# Patient Record
Sex: Female | Born: 1940 | Race: White | Hispanic: No | Marital: Married
Health system: Midwestern US, Community
[De-identification: ages and names within clinical notes are randomized; demographics above are authoritative.]

## PROBLEM LIST (undated history)

## (undated) DIAGNOSIS — I10 Essential (primary) hypertension: Secondary | ICD-10-CM

## (undated) DIAGNOSIS — E119 Type 2 diabetes mellitus without complications: Principal | ICD-10-CM

## (undated) DIAGNOSIS — C50919 Malignant neoplasm of unspecified site of unspecified female breast: Secondary | ICD-10-CM

## (undated) DIAGNOSIS — R5383 Other fatigue: Secondary | ICD-10-CM

## (undated) DIAGNOSIS — Z780 Asymptomatic menopausal state: Secondary | ICD-10-CM

## (undated) DIAGNOSIS — E1169 Type 2 diabetes mellitus with other specified complication: Principal | ICD-10-CM

## (undated) DIAGNOSIS — E785 Hyperlipidemia, unspecified: Secondary | ICD-10-CM

## (undated) DIAGNOSIS — R5381 Other malaise: Secondary | ICD-10-CM

## (undated) DIAGNOSIS — D509 Iron deficiency anemia, unspecified: Secondary | ICD-10-CM

## (undated) DIAGNOSIS — M810 Age-related osteoporosis without current pathological fracture: Secondary | ICD-10-CM

## (undated) DIAGNOSIS — D508 Other iron deficiency anemias: Secondary | ICD-10-CM

## (undated) DIAGNOSIS — G459 Transient cerebral ischemic attack, unspecified: Secondary | ICD-10-CM

## (undated) DIAGNOSIS — R920 Mammographic microcalcification found on diagnostic imaging of breast: Secondary | ICD-10-CM

---

## 2010-01-21 LAB — LIPID PANEL
Cholesterol, Total: 124
HDL: 39 mg/dl
LDL Calculated: 62 mg/dL (ref 0–160)
Triglycerides: 141 mg/dL

## 2010-01-21 LAB — HEMOGLOBIN A1C: Hemoglobin A1C: 11.2 %

## 2010-01-22 LAB — COMPREHENSIVE METABOLIC PANEL
ALT: 70 U/L — ABNORMAL HIGH (ref 0–63)
AST: 54 U/L — ABNORMAL HIGH (ref 0–35)
Albumin: 4.6 g/dL (ref 3.5–5.2)
Alk Phosphatase: 73 U/L (ref 40–135)
Anion Gap: 13 mEq/L (ref 7–13)
BUN: 14 mg/dL (ref 10–26)
CO2: 25 mEq/L (ref 22–32)
Calcium: 9.9 mg/dL (ref 8.5–10.2)
Chloride: 100 mEq/L (ref 99–111)
Creatinine: 0.74 mg/dL (ref 0.50–1.10)
GFR African American: 93.6
GFR Non-African American: 77.3
Globulin: 3 g/dL (ref 2.3–3.5)
Glucose: 159 mg/dL — ABNORMAL HIGH (ref 60–115)
Potassium: 5.2 mEq/L (ref 3.5–5.5)
Sodium: 138 mEq/L (ref 135–146)
Total Bilirubin: 0.3 mg/dL (ref 0.2–1.1)
Total Protein: 7.6 g/dL (ref 6.4–8.1)

## 2010-01-22 LAB — MICROALBUMIN / CREATININE URINE RATIO
Creatinine, Random Urine: 207.7 mg/dL
Microalbumin Creatinine Ratio: 11.1 mg/G (ref 0.0–30.0)
Microalbumin, Random Urine: 2.3 mg/dL

## 2010-01-22 LAB — CBC WITH DIFFERENTIAL
Basophils %: 0.3 % (ref 0.0–2.0)
Basophils Absolute: 0 10*3/uL (ref 0.0–0.2)
Eosinophils %: 1.5 % (ref 0.0–4.0)
Eosinophils Absolute: 0.2 10*3/uL (ref 0.0–0.7)
Granulocyte Absolute Count: 7.2 10*3/uL — ABNORMAL HIGH (ref 1.4–6.5)
Granulocytes, Relative Percent: 68.5 % (ref 42.2–75.2)
HCT: 38.4 % (ref 37.0–47.0)
Hemoglobin: 13 g/dL (ref 12.0–16.0)
Lymphocytes %: 23.4 % (ref 20.5–51.1)
Lymphocytes Absolute: 2.5 10*3/uL (ref 1.0–4.8)
MCH: 29.8 pg (ref 27.0–31.0)
MCHC: 33.9 % (ref 33.0–37.0)
MCV: 88 fL (ref 82.0–100.0)
MPV: 12.4 fL — ABNORMAL HIGH (ref 7.4–10.4)
Monocytes %: 6.3 % (ref 2.0–11.0)
Monocytes Absolute: 0.7 10*3/uL (ref 0.2–0.8)
Platelets: 194 10*3/uL (ref 130–400)
RBC: 4.36 10*6/uL (ref 4.20–5.40)
RDW: 13.8 % (ref 11.5–14.5)
WBC: 10.5 10*3/uL (ref 4.8–10.8)

## 2010-01-22 LAB — LIPID PANEL
Chol/HDL Ratio: 3.2
Cholesterol: 124 mg/dL (ref 0–199)
HDL: 39 mg/dL — ABNORMAL LOW (ref 40–59)
LDL Calculated: 62 mg/dL (ref 0–129)
LDL/HDL Ratio: 1.6 (ref 0.0–2.9)
Triglycerides: 141 mg/dL (ref 0–149)

## 2010-01-22 LAB — HEMOGLOBIN A1C: Hemoglobin A1C: 11.2 %HbA1c — ABNORMAL HIGH (ref 4.2–5.9)

## 2010-03-02 NOTE — Progress Notes (Signed)
Subjective:      Patient ID: Lori Chase is a 69 y.o. female.    Diabetes  She has type 2 diabetes mellitus. The initial diagnosis of diabetes was made 4 years ago. Pertinent negatives for hypoglycemia include no confusion, dizziness or headaches. Pertinent negatives for diabetes include no blurred vision, no chest pain, no fatigue, no visual change and no weakness. Pertinent negatives for hypoglycemia complications include no blackouts and no hospitalization. Current diabetic treatment includes oral agent (monotherapy). Her weight is decreasing steadily. She is following a low fat/cholesterol diet. Meal planning includes calorie counting. She has not had a previous visit with a dietician. She monitors blood glucose at home 1-2 x per day. Blood glucose monitoring compliance is good. Her breakfast blood glucose range is generally 110-130 mg/dl. She does not see a podiatrist.Eye exam is current.       Review of Systems   Constitutional: Negative for fatigue.   Eyes: Negative for blurred vision.   Cardiovascular: Negative for chest pain.   Neurological: Negative for dizziness, weakness and headaches.   Psychiatric/Behavioral: Negative for confusion.       FEMALE ROS:    REVIEW OF SYSTEMS:  The patient reports no problems with hearing or headaches.  She reports no problems with vision. She is advised to have a yearly eye examination. She has no chest pains or pressures, or shortness of breath.  She has no indigestion, heartburn, nausea, or vomiting.   She has no constipation, diarrhea, or  black, bloody, mucousy, or tarry stool.  She has no trouble passing urine or losing urine.  Libido seems to be normal.  She reports no musculoskeletal aches or pains.  No paresthesias or loss of strength.        Objective:   Physical Exam        PHYSICAL EXAMINATION:   VITAL SIGNS: are as recorded.  GENERAL:  The patient appears well nourished and well-developed, and with normal affect.  No acute respiratory distress. Alert and  oriented times 3. No skin rashes.     HEENT:  TMs normal bilaterally.  Canals and ears normal. Pharynx is clear. Extraocular eye motions intact and pain free.   Pupils reactive and equally round.  Sclerae and conjunctivae clear, normocephalic and atraumatic.      RESPIRATORY:  Clear and equal breath sounds with no acute respiratory distress.       HEART: Regular rhythm without murmur, rub or gallop.     ABDOMEN:  soft, nontender. No masses, guarding or rebound. Normoactive bowel sounds.     NECK: No masses or adenopathy palpable.  No bruits heard. No asymmetry visible.     COMPLETE EXTREMITIES: No edema, all four extremities with posterior tibial and radial pulses strong and palpable.     EXTREMITIES:  no edema in any extremity. No cyanosis or clubbing.    LOW BACK: No tenderness to palpation of inferior lumbar spine or either sacroiliac joint area.       Assessment:      No diagnosis found.  1. DM w/o complication type II    2. Hypertension            Plan:    No orders of the defined types were placed in this encounter.       No orders of the defined types were placed in this encounter.     Lose weight.        All bw next

## 2010-03-28 MED ORDER — BAYER BREEZE 2 SYSTEM W/DEVICE KIT
PACK | Status: DC
Start: 2010-03-28 — End: 2012-03-16

## 2010-04-10 MED ORDER — ACCU-CHEK MULTICLIX LANCETS MISC
Status: DC
Start: 2010-04-10 — End: 2010-05-11

## 2010-04-10 NOTE — Progress Notes (Signed)
Subjective:      Patient ID: Lori Chase is a 69 y.o. female.    Hypertension  This is a chronic problem. The current episode started more than 1 year ago. The problem has been waxing and waning since onset. The problem is controlled. Pertinent negatives include no anxiety, blurred vision, chest pain, headaches, neck pain, palpitations or shortness of breath. Risk factors for coronary artery disease include post-menopausal state.     Patient is here for regular check up on DM, and HTN. Patient need rxs for lancets on diabetic meter.    Review of Systems   HENT: Negative for neck pain.    Eyes: Negative for blurred vision.   Respiratory: Negative for shortness of breath.    Cardiovascular: Negative for chest pain and palpitations.   Neurological: Negative for headaches.       FEMALE ROS:    REVIEW OF SYSTEMS:  The patient reports no problems with hearing or headaches.  She reports no problems with vision. She is advised to have a yearly eye examination. She has no chest pains or pressures, or shortness of breath.  She has no indigestion, heartburn, nausea, or vomiting.   She has no constipation, diarrhea, or  black, bloody, mucousy, or tarry stool.  She has no trouble passing urine or losing urine.  Libido seems to be normal.  She reports no musculoskeletal aches or pains.  No paresthesias or loss of strength.        Objective:   Physical Exam      PHYSICAL EXAMINATION:   VITAL SIGNS: are as recorded.  GENERAL:  The patient appears well nourished and well-developed, and with normal affect.  No acute respiratory distress. Alert and oriented times 3. No skin rashes.     HEENT:  TMs normal bilaterally.  Canals and ears normal. Pharynx is clear. Extraocular eye motions intact and pain free.   Pupils reactive and equally round.  Sclerae and conjunctivae clear, normocephalic and atraumatic.      RESPIRATORY:  Clear and equal breath sounds with no acute respiratory distress.       HEART: Regular rhythm without murmur,  rub or gallop.     ABDOMEN:  soft, nontender. No masses, guarding or rebound. Normoactive bowel sounds.     NECK: No masses or adenopathy palpable.  No bruits heard. No asymmetry visible.     COMPLETE EXTREMITIES: No edema, all four extremities with posterior tibial and radial pulses strong and palpable.     EXTREMITIES:  no edema in any extremity. No cyanosis or clubbing.    LOW BACK: No tenderness to palpation of inferior lumbar spine or either sacroiliac joint area.       Assessment:      1. Type II or unspecified type diabetes mellitus without mention of complication, not stated as uncontrolled  Hemoglobin A1c, Accu-Chek Multiclix Lancets MISC   2. Hypertension  Comprehensive metabolic panel   3. HIGH CHOLESTEROL  Lipid panel, Comprehensive metabolic panel           Plan:      Orders Placed This Encounter   Medications   ??? Accu-Chek Multiclix Lancets MISC     Sig: by Does not apply route.     Dispense:  150 each     Refill:  0       Orders Placed This Encounter   Procedures   ??? Lipid panel     Order Specific Question:  Is Patient Fasting?/# of Hours  Answer:  yes   ??? Comprehensive metabolic panel   ??? Hemoglobin A1c     Symptoms and complaints should be improving over 48 hours and entirely resolved in two weeks, if not, patient should call the office for a follow-up appointment.    Patient refuses weight loss modification therapy. Life threatening complications and end-organ damage of not proceeding with recommendations was explained to patient.       Gluc 120-130.

## 2010-04-11 LAB — COMPREHENSIVE METABOLIC PANEL
ALT: 50 U/L (ref 0–63)
AST: 36 U/L — ABNORMAL HIGH (ref 0–35)
Albumin: 4.7 g/dL (ref 3.5–5.2)
Alk Phosphatase: 66 U/L (ref 40–135)
Anion Gap: 9 mEq/L (ref 7–13)
BUN: 15 mg/dL (ref 10–26)
CO2: 28 mEq/L (ref 22–32)
Calcium: 10.4 mg/dL — ABNORMAL HIGH (ref 8.5–10.2)
Chloride: 104 mEq/L (ref 99–111)
Creatinine: 0.76 mg/dL (ref 0.50–1.10)
GFR African American: 90.7
GFR Non-African American: 74.9
Globulin: 2.8 g/dL (ref 2.3–3.5)
Glucose: 122 mg/dL — ABNORMAL HIGH (ref 60–115)
Potassium: 6.3 mEq/L — CR (ref 3.5–5.5)
Sodium: 141 mEq/L (ref 135–146)
Total Bilirubin: 0.3 mg/dL (ref 0.2–1.1)
Total Protein: 7.5 g/dL (ref 6.4–8.1)

## 2010-04-11 LAB — LIPID PANEL
Chol/HDL Ratio: 2.8
Cholesterol: 140 mg/dL (ref 0–199)
HDL: 50 mg/dL (ref 40–59)
LDL Calculated: 70 mg/dL (ref 0–129)
LDL/HDL Ratio: 1.4 (ref 0.0–2.9)
Triglycerides: 122 mg/dL (ref 0–149)

## 2010-04-11 LAB — HEMOGLOBIN A1C: Hemoglobin A1C: 8.2 %HbA1c — ABNORMAL HIGH (ref 4.2–5.9)

## 2010-04-22 NOTE — Progress Notes (Signed)
Quick Note:    Results are abnormal but stable. Follow up with office visit as previously discussed at last visit.  ______

## 2010-05-11 MED ORDER — ESOMEPRAZOLE MAGNESIUM 40 MG PO CPDR
40 MG | ORAL_CAPSULE | Freq: Every day | ORAL | Status: DC
Start: 2010-05-11 — End: 2010-08-24

## 2010-05-11 MED ORDER — ATORVASTATIN CALCIUM 10 MG PO TABS
10 MG | ORAL_TABLET | Freq: Every day | ORAL | Status: DC
Start: 2010-05-11 — End: 2010-08-24

## 2010-05-11 MED ORDER — SITAGLIPTIN PHOSPHATE 100 MG PO TABS
100 MG | ORAL_TABLET | Freq: Every day | ORAL | Status: DC
Start: 2010-05-11 — End: 2010-11-29

## 2010-05-11 MED ORDER — ALENDRONATE SODIUM 70 MG PO TABS
70 MG | ORAL_TABLET | ORAL | Status: DC
Start: 2010-05-11 — End: 2011-01-22

## 2010-05-11 MED ORDER — VALSARTAN 160 MG PO TABS
160 MG | ORAL_TABLET | Freq: Every day | ORAL | Status: DC
Start: 2010-05-11 — End: 2011-06-11

## 2010-05-11 MED ORDER — BAYER MICROLET LANCETS MISC
Status: DC
Start: 2010-05-11 — End: 2012-03-16

## 2010-05-11 MED ORDER — GLIMEPIRIDE 4 MG PO TABS
4 MG | ORAL_TABLET | Freq: Every day | ORAL | Status: DC
Start: 2010-05-11 — End: 2010-05-28

## 2010-05-11 MED ORDER — AMLODIPINE BESYLATE 10 MG PO TABS
10 MG | ORAL_TABLET | Freq: Every day | ORAL | Status: DC
Start: 2010-05-11 — End: 2010-08-24

## 2010-05-11 MED ORDER — METFORMIN HCL ER 500 MG PO TB24
500 MG | ORAL_TABLET | Freq: Every day | ORAL | Status: DC
Start: 2010-05-11 — End: 2010-08-21

## 2010-05-11 NOTE — Progress Notes (Signed)
Subjective:      Patient ID: Lori Chase is a 69 y.o. female.    Diabetes  She presents for her follow-up diabetic visit. She has type 2 diabetes mellitus. Her disease course has been fluctuating. Pertinent negatives for hypoglycemia include no confusion, dizziness or headaches. Pertinent negatives for diabetes include no blurred vision, no chest pain, no fatigue, no foot ulcerations, no visual change and no weakness. Pertinent negatives for hypoglycemia complications include no blackouts. Risk factors for coronary artery disease include diabetes mellitus. Current diabetic treatment includes oral agent (monotherapy). She is compliant with treatment all of the time. Her weight is stable. She is following a diabetic diet. She monitors blood glucose at home 1-2 x per day. Her home blood glucose trend is fluctuating minimally.   Hypertension  This is a chronic problem. The current episode started more than 1 year ago. The problem has been waxing and waning since onset. Pertinent negatives include no anxiety, blurred vision, chest pain, headaches, neck pain or shortness of breath. Risk factors for coronary artery disease include dyslipidemia and diabetes mellitus. There are no compliance problems.      Patient is here for regular check up. Takes current medications and need refills on all current medication including bayer lansets.       Review of Systems   Constitutional: Negative for fatigue.   HENT: Negative for neck pain.    Eyes: Negative for blurred vision.   Respiratory: Negative for shortness of breath.    Cardiovascular: Negative for chest pain.   Neurological: Negative for dizziness, weakness and headaches.   Psychiatric/Behavioral: Negative for confusion.       FEMALE ROS:    REVIEW OF SYSTEMS:  The patient reports no problems with hearing or headaches.  She reports no problems with vision. She is advised to have a yearly eye examination. She has no chest pains or pressures, or shortness of breath.  She  has no indigestion, heartburn, nausea, or vomiting.   She has no constipation, diarrhea, or  black, bloody, mucousy, or tarry stool.  She has no trouble passing urine or losing urine.  Libido seems to be normal.  She reports no musculoskeletal aches or pains.  No paresthesias or loss of strength.        Objective:   Physical Exam      PHYSICAL EXAMINATION:   VITAL SIGNS: are as recorded.  GENERAL:  The patient appears well nourished and well-developed, and with normal affect.  No acute respiratory distress. Alert and oriented times 3. No skin rashes.     HEENT:  TMs normal bilaterally.  Canals and ears normal. Pharynx is clear. Extraocular eye motions intact and pain free.   Pupils reactive and equally round.  Sclerae and conjunctivae clear, normocephalic and atraumatic.      RESPIRATORY:  Clear and equal breath sounds with no acute respiratory distress.       HEART: Regular rhythm without murmur, rub or gallop.     ABDOMEN:  soft, nontender. No masses, guarding or rebound. Normoactive bowel sounds.     NECK: No masses or adenopathy palpable.  No bruits heard. No asymmetry visible.     COMPLETE EXTREMITIES: No edema, all four extremities with posterior tibial and radial pulses strong and palpable.     EXTREMITIES:  no edema in any extremity. No cyanosis or clubbing.    LOW BACK: No tenderness to palpation of inferior lumbar spine or either sacroiliac joint area.  Assessment:      1. Type II or unspecified type diabetes mellitus without mention of complication, not stated as uncontrolled     2. Hypertension  Basic metabolic panel   3. Anxiety             Plan:      Orders Placed This Encounter   Medications   ??? BAYER MICROLET LANCETS MISC     Sig: by Does not apply route.     Dispense:  100 each     Refill:  1   ??? metformin (GLUCOPHAGE XR) 500 MG XR tablet     Sig: Take 1 tablet by mouth daily (with breakfast). 4 tablets together once daily     Dispense:  360 tablet     Refill:  0   ??? alendronate (FOSAMAX) 70 MG  tablet     Sig: Take 1 tablet by mouth every 7 days.     Dispense:  12 tablet     Refill:  0   ??? atorvastatin (LIPITOR) 10 MG tablet     Sig: Take 1 tablet by mouth daily.     Dispense:  90 tablet     Refill:  0   ??? amlodipine (NORVASC) 10 MG tablet     Sig: Take 1 tablet by mouth daily.     Dispense:  90 tablet     Refill:  0   ??? valsartan (DIOVAN) 160 MG tablet     Sig: Take 1 tablet by mouth daily.     Dispense:  90 tablet     Refill:  0   ??? sitagliptan (JANUVIA) 100 MG tablet     Sig: Take 1 tablet by mouth daily.     Dispense:  90 tablet     Refill:  0   ??? esomeprazole (NEXIUM) 40 MG capsule     Sig: Take 1 capsule by mouth every morning (before breakfast).     Dispense:  90 capsule     Refill:  0   ??? glimepiride (AMARYL) 4 MG tablet     Sig: Take 1 tablet by mouth every morning (before breakfast). 2 tablet together once daily       Dispense:  180 tablet     Refill:  0       Orders Placed This Encounter   Procedures   ??? Basic metabolic panel     Sugars <140.    Inc mg oxide bid.    Patient refuses good diabetic care. Life threatening complications and end-organ damage of not proceeding with recommendations was explained to patient.     Symptoms and complaints should be improving over 48 hours and entirely resolved in two weeks, if not, patient should call the office for a follow-up appointment.

## 2010-05-12 LAB — BASIC METABOLIC PANEL
Anion Gap: 10 mEq/L (ref 7–13)
BUN: 17 mg/dL (ref 10–26)
CO2: 30 mEq/L (ref 22–32)
Calcium: 10 mg/dL (ref 8.5–10.2)
Chloride: 101 mEq/L (ref 99–111)
Creatinine: 0.71 mg/dL (ref 0.50–1.10)
GFR African American: 98.1
GFR Non-African American: 81
Glucose: 190 mg/dL — ABNORMAL HIGH (ref 60–115)
Potassium: 5.3 mEq/L (ref 3.5–5.5)
Sodium: 141 mEq/L (ref 135–146)

## 2010-05-28 MED ORDER — GLIMEPIRIDE 4 MG PO TABS
4 MG | ORAL_TABLET | Freq: Every day | ORAL | Status: DC
Start: 2010-05-28 — End: 2010-07-23

## 2010-06-08 NOTE — Progress Notes (Signed)
Subjective:      Patient ID: Lori Chase is a 70 y.o. female.    Diabetes  She presents for her follow-up diabetic visit. She has type 2 diabetes mellitus. Her disease course has been stable. Pertinent negatives for hypoglycemia include no confusion, dizziness, headaches, hunger, nervousness/anxiousness, sleepiness or speech difficulty. Pertinent negatives for diabetes include no blurred vision, no chest pain, no fatigue, no foot paresthesias, no foot ulcerations, no visual change, no weakness and no weight loss. Pertinent negatives for hypoglycemia complications include no blackouts and no hospitalization. Symptoms are stable. Current diabetic treatment includes oral agent (monotherapy). Her weight is stable. She monitors blood glucose at home 1-2 x per day. Blood glucose monitoring compliance is good.     Patient is present today for a regular check up. She takes current medications and does not need refills at this time    Review of Systems   Constitutional: Negative for weight loss and fatigue.   Eyes: Negative for blurred vision.   Cardiovascular: Negative for chest pain.   Neurological: Negative for dizziness, speech difficulty, weakness and headaches.   Psychiatric/Behavioral: Negative for confusion. The patient is not nervous/anxious.      FEMALE ROS:    REVIEW OF SYSTEMS:  The patient reports no problems with hearing or headaches.  She reports no problems with vision. She is advised to have a yearly eye examination. She has no chest pains or pressures, or shortness of breath.  She has no indigestion, heartburn, nausea, or vomiting.   She has no constipation, diarrhea, or  black, bloody, mucousy, or tarry stool.  She has no trouble passing urine or losing urine.  Libido seems to be normal.  She reports no musculoskeletal aches or pains.  No paresthesias or loss of strength.          Objective:   Physical Exam      PHYSICAL EXAMINATION:   VITAL SIGNS: are as recorded.  GENERAL:  The patient appears well  nourished and well-developed, and with normal affect.  No acute respiratory distress. Alert and oriented times 3. No skin rashes.     HEENT:  TMs normal bilaterally.  Canals and ears normal. Pharynx is clear. Extraocular eye motions intact and pain free.   Pupils reactive and equally round.  Sclerae and conjunctivae clear, normocephalic and atraumatic.      RESPIRATORY:  Clear and equal breath sounds with no acute respiratory distress.       HEART: Regular rhythm without murmur, rub or gallop.     ABDOMEN:  soft, nontender. No masses, guarding or rebound. Normoactive bowel sounds.     NECK: No masses or adenopathy palpable.  No bruits heard. No asymmetry visible.     COMPLETE EXTREMITIES: No edema, all four extremities with posterior tibial and radial pulses strong and palpable.     EXTREMITIES:  no edema in any extremity. No cyanosis or clubbing.    LOW BACK: No tenderness to palpation of inferior lumbar spine or either sacroiliac joint area.         Assessment:      Encounter Diagnoses   Name Primary?   ??? Type II or unspecified type diabetes mellitus without mention of complication, not stated as uncontrolled    ??? Hypertension    ??? Anxiety    ??? Obesity            Plan:      No orders of the defined types were placed in this encounter.  No orders of the defined types were placed in this encounter.     Symptoms and complaints should be improving over 48 hours and entirely resolved in two weeks, if not, patient should call the office for a follow-up appointment.    BW next.    Patient refuses weight loss modification therapy. Life threatening complications and end-organ damage of not proceeding with recommendations was explained to patient.

## 2010-07-16 NOTE — Progress Notes (Addendum)
Subjective:      Patient ID: Lori Chase is a 70 y.o. female.    Diabetes  She presents for her follow-up diabetic visit. She has type 2 diabetes mellitus. Her disease course has been stable. Pertinent negatives for hypoglycemia include no confusion, dizziness, headaches, mood changes, nervousness/anxiousness, sleepiness or speech difficulty. Pertinent negatives for diabetes include no blurred vision, no chest pain, no foot ulcerations, no polydipsia, no visual change and no weakness. Her weight is stable. She is following a generally healthy diet. She has not had a previous visit with a dietician. She monitors blood glucose at home 1-2 x per day. She does not see a podiatrist.Eye exam is current.     Patient is here for a regular check up. She states she is due for some blood work. She takes current medications she does not need refills at this time.      Review of Systems   Eyes: Negative for blurred vision.   Cardiovascular: Negative for chest pain.   Neurological: Negative for dizziness, speech difficulty, weakness and headaches.   Hematological: Negative for polydipsia.   Psychiatric/Behavioral: Negative for confusion. The patient is not nervous/anxious.      FEMALE ROS:    REVIEW OF SYSTEMS:  The patient reports no problems with hearing or headaches.  She reports no problems with vision. She is advised to have a yearly eye examination. She has no chest pains or pressures, or shortness of breath.  She has no indigestion, heartburn, nausea, or vomiting.   She has no constipation, diarrhea, or  black, bloody, mucousy, or tarry stool.  She has no trouble passing urine or losing urine.  Libido seems to be normal.  She reports no musculoskeletal aches or pains.  No paresthesias or loss of strength.          Objective:   Physical Exam      PHYSICAL EXAMINATION:   VITAL SIGNS: are as recorded.  GENERAL:  The patient appears well nourished and well-developed, and with normal affect.  No acute respiratory distress.  Alert and oriented times 3. No skin rashes.     HEENT:  TMs normal bilaterally.  Canals and ears normal. Pharynx is clear. Extraocular eye motions intact and pain free.   Pupils reactive and equally round.  Sclerae and conjunctivae clear, normocephalic and atraumatic.      RESPIRATORY:  Clear and equal breath sounds with no acute respiratory distress.       HEART: Regular rhythm without murmur, rub or gallop.     ABDOMEN:  soft, nontender. No masses, guarding or rebound. Normoactive bowel sounds.     NECK: No masses or adenopathy palpable.  No bruits heard. No asymmetry visible.     COMPLETE EXTREMITIES: No edema, all four extremities with posterior tibial and radial pulses strong and palpable.     EXTREMITIES:  no edema in any extremity. No cyanosis or clubbing.    LOW BACK: No tenderness to palpation of inferior lumbar spine or either sacroiliac joint area.       Assessment:      Encounter Diagnoses   Name Primary?   ??? Type II or unspecified type diabetes mellitus without mention of complication, not stated as uncontrolled    ??? Hypertension    ??? Other malaise and fatigue    ??? HIGH CHOLESTEROL            Plan:      No orders of the defined types were placed in this  encounter.     Orders Placed This Encounter   Procedures   ??? CBC auto differential   ??? Comprehensive metabolic panel   ??? Lipid panel     Order Specific Question:  Is Patient Fasting?/# of Hours     Answer:  yes   ??? Hemoglobin A1c     Symptoms and complaints should be improving over 48 hours and entirely resolved in two weeks, if not, patient should call the office for a follow-up appointment.    Patient refuses weight loss modification therapy. Life threatening complications and end-organ damage of not proceeding with recommendations was explained to patient.     Microalb next.    Eye exam qyr.      DIABETIC FOOT EXAM:    Dorsalis pedis and posterior tibial pulses are symmetric.  No fissures between the toes.  No open sores on the feet.  Sensation  intact.

## 2010-07-17 LAB — CBC WITH DIFFERENTIAL
Basophils %: 0.1 % (ref 0.0–2.0)
Basophils Absolute: 0 10*3/uL (ref 0.0–0.2)
Eosinophils %: 1.4 % (ref 0.0–4.0)
Eosinophils Absolute: 0.1 10*3/uL (ref 0.0–0.7)
Granulocyte Absolute Count: 7.1 10*3/uL — ABNORMAL HIGH (ref 1.4–6.5)
Granulocytes, Relative Percent: 72.2 % (ref 42.2–75.2)
HCT: 38.9 % (ref 37.0–47.0)
Hemoglobin: 12.7 g/dL (ref 12.0–16.0)
Lymphocytes %: 19.7 % — ABNORMAL LOW (ref 20.5–51.1)
Lymphocytes Absolute: 1.9 10*3/uL (ref 1.0–4.8)
MCH: 28.9 pg (ref 27.0–31.0)
MCHC: 32.5 % — ABNORMAL LOW (ref 33.0–37.0)
MCV: 88.8 fL (ref 82.0–100.0)
MPV: 11.4 fL — ABNORMAL HIGH (ref 7.4–10.4)
Monocytes %: 6.6 % (ref 2.0–11.0)
Monocytes Absolute: 0.6 10*3/uL (ref 0.2–0.8)
Platelets: 257 10*3/uL (ref 130–400)
RBC: 4.38 10*6/uL (ref 4.20–5.40)
RDW: 14.3 % (ref 11.5–14.5)
WBC: 9.8 10*3/uL (ref 4.8–10.8)

## 2010-07-17 LAB — COMPREHENSIVE METABOLIC PANEL
ALT: 58 U/L (ref 0–63)
AST: 35 U/L (ref 0–35)
Albumin: 4.5 g/dL (ref 3.5–5.2)
Alk Phosphatase: 65 U/L (ref 40–135)
Anion Gap: 8 mEq/L (ref 7–13)
BUN: 15 mg/dL (ref 10–26)
CO2: 26 mEq/L (ref 22–32)
Calcium: 10.1 mg/dL (ref 8.5–10.2)
Chloride: 105 mEq/L (ref 99–111)
Creatinine: 0.75 mg/dL (ref 0.50–1.10)
GFR African American: 92
GFR Non-African American: 76
Globulin: 3.1 g/dL (ref 2.3–3.5)
Glucose: 229 mg/dL — ABNORMAL HIGH (ref 60–115)
Potassium: 4.7 mEq/L (ref 3.5–5.5)
Sodium: 139 mEq/L (ref 135–146)
Total Bilirubin: 0.3 mg/dL (ref 0.2–1.1)
Total Protein: 7.6 g/dL (ref 6.4–8.1)

## 2010-07-17 LAB — LIPID PANEL
Chol/HDL Ratio: 3
Cholesterol: 142 mg/dL (ref 0–199)
HDL: 48 mg/dL (ref 40–59)
LDL Calculated: 67 mg/dL (ref 0–129)
LDL/HDL Ratio: 1.4 (ref 0.0–2.9)
Triglycerides: 167 mg/dL — ABNORMAL HIGH (ref 0–149)

## 2010-07-17 LAB — HEMOGLOBIN A1C: Hemoglobin A1C: 8.2 %HbA1c — ABNORMAL HIGH (ref 4.2–5.9)

## 2010-07-23 MED ORDER — GLIMEPIRIDE 4 MG PO TABS
4 MG | ORAL_TABLET | ORAL | Status: DC
Start: 2010-07-23 — End: 2011-01-28

## 2010-07-23 MED ORDER — VALSARTAN 80 MG PO TABS
80 MG | ORAL_TABLET | Freq: Every day | ORAL | Status: DC
Start: 2010-07-23 — End: 2011-01-28

## 2010-07-23 NOTE — Progress Notes (Signed)
Subjective:      Patient ID: Lori Chase is a 70 y.o. female.    Diabetes  She presents for her follow-up diabetic visit. She has type 2 diabetes mellitus. Pertinent negatives for hypoglycemia include no confusion, dizziness, headaches, mood changes, nervousness/anxiousness or speech difficulty. Pertinent negatives for diabetes include no blurred vision, no chest pain, no visual change and no weakness. Pertinent negatives for hypoglycemia complications include no blackouts and no hospitalization. Risk factors for coronary artery disease include diabetes mellitus, dyslipidemia and post-menopausal. Current diabetic treatment includes oral agent (monotherapy) and diet (metformin). She is compliant with treatment all of the time. Her weight is stable. She has not had a previous visit with a dietician. Her home blood glucose trend is fluctuating minimally. She does not see a podiatrist.Eye exam is current.     Patient is here for a follow up her medications. She would like to discuss if she needs to be taken off of them.    Review of Systems   Eyes: Negative for blurred vision.   Cardiovascular: Negative for chest pain.   Neurological: Negative for dizziness, speech difficulty, weakness and headaches.   Psychiatric/Behavioral: Negative for confusion. The patient is not nervous/anxious.      FEMALE ROS:    REVIEW OF SYSTEMS:  The patient reports no problems with hearing or headaches.  She reports no problems with vision. She is advised to have a yearly eye examination. She has no chest pains or pressures, or shortness of breath.  She has no indigestion, heartburn, nausea, or vomiting.   She has no constipation, diarrhea, or  black, bloody, mucousy, or tarry stool.  She has no trouble passing urine or losing urine.  Libido seems to be normal.  She reports no musculoskeletal aches or pains.  No paresthesias or loss of strength.          Objective:   Physical Exam      PHYSICAL EXAMINATION:   VITAL SIGNS: are as  recorded.  GENERAL:  The patient appears well nourished and well-developed, and with normal affect.  No acute respiratory distress. Alert and oriented times 3. No skin rashes.     HEENT:  TMs normal bilaterally.  Canals and ears normal. Pharynx is clear. Extraocular eye motions intact and pain free.   Pupils reactive and equally round.  Sclerae and conjunctivae clear, normocephalic and atraumatic.      RESPIRATORY:  Clear and equal breath sounds with no acute respiratory distress.       HEART: Regular rhythm without murmur, rub or gallop.     ABDOMEN:  soft, nontender. No masses, guarding or rebound. Normoactive bowel sounds.     NECK: No masses or adenopathy palpable.  No bruits heard. No asymmetry visible.     COMPLETE EXTREMITIES: No edema, all four extremities with posterior tibial and radial pulses strong and palpable.     EXTREMITIES:  no edema in any extremity. No cyanosis or clubbing.    LOW BACK: No tenderness to palpation of inferior lumbar spine or either sacroiliac joint area.       Assessment:      Encounter Diagnoses   Name Primary?   ??? Type II or unspecified type diabetes mellitus without mention of complication, not stated as uncontrolled    ??? HIGH CHOLESTEROL    ??? Anxiety    ??? Hypertension            Plan:      Orders Placed This Encounter  Medications   ??? glimepiride (AMARYL) 4 MG tablet     Sig: 2 tablet together once daily       Dispense:  180 tablet     Refill:  1   ??? valsartan (DIOVAN) 80 MG tablet     Sig: Take 1 tablet by mouth daily.     Dispense:  90 tablet     Refill:  1     No orders of the defined types were placed in this encounter.     microalbumen next.    Symptoms and complaints should be improving over 48 hours and entirely resolved in two weeks, if not, patient should call the office for a follow-up appointment.    Patient refuses insulin. Life threatening complications and end-organ damage of not proceeding with recommendations was explained to patient.     Patient refuses good  diabetic care. Life threatening complications and end-organ damage of not proceeding with recommendations was explained to patient.       Patient refuses weight loss modification therapy. Life threatening complications and end-organ damage of not proceeding with recommendations was explained to patient.

## 2010-08-22 MED ORDER — METFORMIN HCL ER 500 MG PO TB24
500 MG | ORAL_TABLET | ORAL | Status: DC
Start: 2010-08-22 — End: 2010-12-14

## 2010-08-24 MED ORDER — AMLODIPINE BESYLATE 10 MG PO TABS
10 MG | ORAL_TABLET | Freq: Every day | ORAL | Status: DC
Start: 2010-08-24 — End: 2010-12-14

## 2010-08-24 MED ORDER — ESOMEPRAZOLE MAGNESIUM 40 MG PO CPDR
40 MG | ORAL_CAPSULE | Freq: Every day | ORAL | Status: DC
Start: 2010-08-24 — End: 2010-12-14

## 2010-08-24 MED ORDER — ATORVASTATIN CALCIUM 10 MG PO TABS
10 MG | ORAL_TABLET | Freq: Every day | ORAL | Status: DC
Start: 2010-08-24 — End: 2010-12-14

## 2010-08-24 NOTE — Progress Notes (Signed)
Subjective:      Patient ID: Lori Chase is a 70 y.o. female.    Diabetes  She presents for her follow-up diabetic visit. She has type 2 diabetes mellitus. Pertinent negatives for hypoglycemia include no confusion, dizziness, headaches, mood changes, nervousness/anxiousness, pallor, sleepiness or speech difficulty. Pertinent negatives for diabetes include no blurred vision, no chest pain, no foot paresthesias, no foot ulcerations, no polydipsia, no polyphagia, no polyuria, no visual change and no weakness. Pertinent negatives for hypoglycemia complications include no blackouts and no hospitalization. Current diabetic treatment includes oral agent (monotherapy) (metformin, glimeperide). She is following a diabetic and generally healthy diet. Her home blood glucose trend is fluctuating minimally. Her breakfast blood glucose range is generally 110-130 mg/dl. Her overall blood glucose range is 110-130 mg/dl.     Patient is here for regular check up. She needs her lipitor norvasc and nexium refilled at this time    Review of Systems   Eyes: Negative for blurred vision.   Cardiovascular: Negative for chest pain.   Genitourinary: Negative for polyuria.   Skin: Negative for pallor.   Neurological: Negative for dizziness, speech difficulty, weakness and headaches.   Hematological: Negative for polydipsia and polyphagia.   Psychiatric/Behavioral: Negative for confusion. The patient is not nervous/anxious.        Objective:   Physical Exam      PHYSICAL EXAMINATION:   VITAL SIGNS: are as recorded.  GENERAL:  The patient appears well nourished and well-developed, and with normal affect.  No acute respiratory distress. Alert and oriented times 3. No skin rashes.     HEENT:  TMs normal bilaterally.  Canals and ears normal. Pharynx is clear. Extraocular eye motions intact and pain free.   Pupils reactive and equally round.  Sclerae and conjunctivae clear, normocephalic and atraumatic.      RESPIRATORY:  Clear and equal breath  sounds with no acute respiratory distress.       HEART: Regular rhythm without murmur, rub or gallop.     ABDOMEN:  soft, nontender. No masses, guarding or rebound. Normoactive bowel sounds.     NECK: No masses or adenopathy palpable.  No bruits heard. No asymmetry visible.     COMPLETE EXTREMITIES: No edema, all four extremities with posterior tibial and radial pulses strong and palpable.     EXTREMITIES:  no edema in any extremity. No cyanosis or clubbing.    LOW BACK: No tenderness to palpation of inferior lumbar spine or either sacroiliac joint area.       Assessment:      Encounter Diagnoses   Name Primary?   ??? Type II or unspecified type diabetes mellitus without mention of complication, not stated as uncontrolled    ??? Anxiety    ??? Hypertension    ??? Cancer    ??? HIGH CHOLESTEROL            Plan:      Orders Placed This Encounter   Medications   ??? atorvastatin (LIPITOR) 10 MG tablet     Sig: Take 1 tablet by mouth daily.     Dispense:  90 tablet     Refill:  0   ??? amlodipine (NORVASC) 10 MG tablet     Sig: Take 1 tablet by mouth daily.     Dispense:  90 tablet     Refill:  0   ??? esomeprazole (NEXIUM) 40 MG capsule     Sig: Take 1 capsule by mouth every morning (before breakfast).  Dispense:  90 capsule     Refill:  0     Orders Placed This Encounter   Procedures   ??? Microalbumin, Ur     Eye exam q yr.      DIABETIC FOOT EXAM:    Dorsalis pedis and posterior tibial pulses are symmetric.  No fissures between the toes.  No open sores on the feet.  Sensation intact .    Symptoms and complaints should be improving over 48 hours and entirely resolved in two weeks, if not, patient should call the office for a follow-up appointment.    See PV.

## 2010-08-25 LAB — MICROALBUMIN / CREATININE URINE RATIO
Creatinine, Random Urine: 192.3 mg/dL
Microalbumin Creatinine Ratio: 7.3 mg/G (ref 0.0–30.0)
Microalbumin, Random Urine: 1.4 mg/dL

## 2010-11-04 NOTE — Progress Notes (Signed)
Subjective:      Patient ID: Lori Chase is a 70 y.o. female.    Diabetes  She presents for her follow-up diabetic visit. She has type 2 diabetes mellitus. Pertinent negatives for hypoglycemia include no confusion, dizziness, headaches, hunger, mood changes, nervousness/anxiousness, pallor, seizures, sleepiness, speech difficulty, sweats or tremors. Pertinent negatives for diabetes include no blurred vision, no chest pain, no fatigue, no foot paresthesias, no foot ulcerations, no polydipsia, no polyphagia, no visual change and no weakness. Current diabetic treatment includes oral agent (monotherapy) Alma Friendly). She is following a diabetic and generally healthy diet. She has not had a previous visit with a dietician. She monitors blood glucose at home 1-2 x per day. Blood glucose monitoring compliance is excellent. Her home blood glucose trend is fluctuating minimally. She does not see a podiatrist.Eye exam is current.       Review of Systems   Constitutional: Negative for fatigue.   Eyes: Negative for blurred vision.   Cardiovascular: Negative for chest pain.   Skin: Negative for pallor.   Neurological: Negative for dizziness, tremors, seizures, speech difficulty, weakness and headaches.   Hematological: Negative for polydipsia and polyphagia.   Psychiatric/Behavioral: Negative for confusion. The patient is not nervous/anxious.        Objective:   Physical Exam      PHYSICAL EXAMINATION:   VITAL SIGNS: are as recorded.  GENERAL:  The patient appears well nourished and well-developed, and with normal affect.  No acute respiratory distress. Alert and oriented times 3. No skin rashes.     HEENT:  TMs normal bilaterally.  Canals and ears normal. Pharynx is clear. Extraocular eye motions intact and pain free.   Pupils reactive and equally round.  Sclerae and conjunctivae clear, normocephalic and atraumatic.      RESPIRATORY:  Clear and equal breath sounds with no acute respiratory distress.       HEART: Regular rhythm  without murmur, rub or gallop.     ABDOMEN:  soft, nontender. No masses, guarding or rebound. Normoactive bowel sounds.     NECK: No masses or adenopathy palpable.  No bruits heard. No asymmetry visible.     COMPLETE EXTREMITIES: No edema, all four extremities with posterior tibial and radial pulses strong and palpable.     EXTREMITIES:  no edema in any extremity. No cyanosis or clubbing.    LOW BACK: No tenderness to palpation of inferior lumbar spine or either sacroiliac joint area.       Assessment:      Encounter Diagnoses   Name Primary?   ??? Type II or unspecified type diabetes mellitus without mention of complication, not stated as uncontrolled    ??? Anxiety    ??? Hypertension    ??? HIGH CHOLESTEROL    ??? GERD (gastroesophageal reflux disease)    ??? Osteopenia    ??? Other malaise and fatigue            Plan:      No orders of the defined types were placed in this encounter.     Orders Placed This Encounter   Procedures   ??? CBC auto differential   ??? Comprehensive metabolic panel   ??? Lipid panel     Order Specific Question:  Is Patient Fasting?/# of Hours     Answer:  yes   ??? Hemoglobin A1c   ??? TSH     Health Maintenance Due   Topic Date Due   ??? Tetanus Vaccine Adult (11 Years And  Up)  11/09/1951   ??? Breast Cancer Screening  11/10/1978   ??? Colon Cancer Screening Colonoscopy  11/09/1988   ??? Pneumovax Vaccine Adult (#1) 11/10/2003   ??? Dexa Scan Women  11/10/2003     .    Symptoms and complaints should be improving over 48 hours and entirely resolved in two weeks, if not, patient should call the office for a follow-up appointment.        See pv.    Patient refuses weight loss modification therapy. Life threatening complications and end-organ damage of not proceeding with recommendations was explained to patient.

## 2010-11-05 LAB — CBC WITH DIFFERENTIAL
Basophils %: 0.3 % (ref 0.0–2.0)
Basophils Absolute: 0 10*3/uL (ref 0.0–0.2)
Eosinophils %: 1.8 % (ref 0.0–4.0)
Eosinophils Absolute: 0.2 10*3/uL (ref 0.0–0.7)
Granulocyte Absolute Count: 6 10*3/uL (ref 1.4–6.5)
Granulocytes, Relative Percent: 64.8 % (ref 42.2–75.2)
Hematocrit: 37.5 % (ref 37.0–47.0)
Hemoglobin: 12.6 g/dL (ref 12.0–16.0)
Lymphocytes %: 25.5 % (ref 20.5–51.1)
Lymphocytes Absolute: 2.3 10*3/uL (ref 1.0–4.8)
MCH: 29.8 pg (ref 27.0–31.0)
MCHC: 33.6 % (ref 33.0–37.0)
MCV: 88.5 fL (ref 82.0–100.0)
MPV: 11.5 fL — ABNORMAL HIGH (ref 7.4–10.4)
Monocytes %: 7.6 % (ref 2.0–11.0)
Monocytes Absolute: 0.7 10*3/uL (ref 0.2–0.8)
Platelets: 210 10*3/uL (ref 130–400)
RBC: 4.23 10*6/uL (ref 4.20–5.40)
RDW: 14.6 % — ABNORMAL HIGH (ref 11.5–14.5)
WBC: 9.2 10*3/uL (ref 4.8–10.8)

## 2010-11-05 LAB — LIPID PANEL
Chol/HDL Ratio: 3.3
Cholesterol: 130 mg/dL (ref 0–199)
HDL: 39 mg/dL — ABNORMAL LOW (ref 40–59)
LDL Calculated: 60 mg/dL (ref 0–129)
LDL/HDL Ratio: 1.5 (ref 0.0–2.9)
Triglycerides: 194 mg/dL — ABNORMAL HIGH (ref 0–149)

## 2010-11-05 LAB — TSH, HIGH SENSITIVE: TSH: 0.773 u[IU]/mL (ref 0.550–4.780)

## 2010-11-05 LAB — COMPREHENSIVE METABOLIC PANEL
ALT: 85 U/L — ABNORMAL HIGH (ref 0–63)
AST: 50 U/L — ABNORMAL HIGH (ref 0–35)
Albumin: 4.7 g/dL (ref 3.5–5.2)
Alk Phosphatase: 75 U/L (ref 40–135)
Anion Gap: 8 mEq/L (ref 7–13)
BUN: 18 mg/dL (ref 10–26)
CO2: 30 mEq/L (ref 22–32)
Calcium: 10.1 mg/dL (ref 8.5–10.2)
Chloride: 101 mEq/L (ref 99–111)
Creatinine: 0.78 mg/dL (ref 0.50–1.10)
GFR African American: 87.8
GFR Non-African American: 72.6
Globulin: 3.2 g/dL (ref 2.3–3.5)
Glucose: 160 mg/dL — ABNORMAL HIGH (ref 60–115)
Potassium: 4.8 mEq/L (ref 3.5–5.5)
Sodium: 139 mEq/L (ref 135–146)
Total Bilirubin: 0.3 mg/dL (ref 0.2–1.1)
Total Protein: 7.9 g/dL (ref 6.4–8.1)

## 2010-11-05 LAB — HEMOGLOBIN A1C: Hemoglobin A1C: 7.8 %HbA1c — ABNORMAL HIGH (ref 4.2–5.9)

## 2010-11-30 MED ORDER — JANUVIA 100 MG PO TABS
100 MG | ORAL_TABLET | ORAL | Status: DC
Start: 2010-11-30 — End: 2011-03-01

## 2010-12-14 NOTE — Telephone Encounter (Signed)
Sign and close encounter.

## 2010-12-15 MED ORDER — AMLODIPINE BESYLATE 10 MG PO TABS
10 MG | ORAL_TABLET | ORAL | Status: DC
Start: 2010-12-15 — End: 2011-03-01

## 2010-12-15 MED ORDER — NEXIUM 40 MG PO CPDR
40 MG | ORAL_CAPSULE | ORAL | Status: DC
Start: 2010-12-15 — End: 2011-03-01

## 2010-12-15 MED ORDER — ATORVASTATIN CALCIUM 10 MG PO TABS
10 MG | ORAL_TABLET | ORAL | Status: DC
Start: 2010-12-15 — End: 2011-03-01

## 2010-12-15 MED ORDER — METFORMIN HCL ER 500 MG PO TB24
500 MG | ORAL_TABLET | ORAL | Status: DC
Start: 2010-12-15 — End: 2011-03-01

## 2011-01-26 MED ORDER — ALENDRONATE SODIUM 70 MG PO TABS
70 MG | ORAL_TABLET | ORAL | Status: DC
Start: 2011-01-26 — End: 2011-03-01

## 2011-01-28 MED ORDER — GLIMEPIRIDE 4 MG PO TABS
4 MG | ORAL_TABLET | ORAL | Status: DC
Start: 2011-01-28 — End: 2011-03-01

## 2011-01-28 MED ORDER — DIOVAN 80 MG PO TABS
80 MG | ORAL_TABLET | ORAL | Status: DC
Start: 2011-01-28 — End: 2011-03-01

## 2011-01-28 NOTE — Telephone Encounter (Signed)
Last seen 11-04-10

## 2011-03-01 LAB — CBC

## 2011-03-01 MED ORDER — ESOMEPRAZOLE MAGNESIUM 40 MG PO CPDR
40 MG | ORAL_CAPSULE | Freq: Every day | ORAL | Status: DC
Start: 2011-03-01 — End: 2011-06-11

## 2011-03-01 MED ORDER — GLIMEPIRIDE 4 MG PO TABS
4 MG | ORAL_TABLET | ORAL | Status: DC
Start: 2011-03-01 — End: 2011-06-11

## 2011-03-01 MED ORDER — METFORMIN HCL ER 500 MG PO TB24
500 MG | ORAL_TABLET | Freq: Four times a day (QID) | ORAL | Status: DC
Start: 2011-03-01 — End: 2011-06-11

## 2011-03-01 MED ORDER — AMLODIPINE BESYLATE 10 MG PO TABS
10 MG | ORAL_TABLET | Freq: Every day | ORAL | Status: DC
Start: 2011-03-01 — End: 2011-06-11

## 2011-03-01 MED ORDER — VALSARTAN 80 MG PO TABS
80 MG | ORAL_TABLET | Freq: Every day | ORAL | Status: DC
Start: 2011-03-01 — End: 2011-06-11

## 2011-03-01 MED ORDER — ALENDRONATE SODIUM 70 MG PO TABS
70 MG | ORAL_TABLET | ORAL | Status: DC
Start: 2011-03-01 — End: 2011-06-11

## 2011-03-01 MED ORDER — ATORVASTATIN CALCIUM 10 MG PO TABS
10 MG | ORAL_TABLET | Freq: Every day | ORAL | Status: DC
Start: 2011-03-01 — End: 2011-06-11

## 2011-03-01 MED ORDER — SITAGLIPTIN PHOSPHATE 100 MG PO TABS
100 MG | ORAL_TABLET | Freq: Every day | ORAL | Status: DC
Start: 2011-03-01 — End: 2011-06-11

## 2011-03-01 NOTE — Progress Notes (Addendum)
Subjective:      Patient ID: Lori Chase is a 70 y.o. female.    Otalgia   There is pain in the right ear. The current episode started 1 to 4 weeks ago (x2 weeks ). The problem has been rapidly improving (pt states ear pain is not present at this time however would like to have it checked ). There has been no fever. Associated symptoms include neck pain. Pertinent negatives include no abdominal pain, diarrhea, drainage, ear discharge, headaches, hearing loss, sore throat or vomiting.       Review of Systems   HENT: Positive for ear pain and neck pain. Negative for hearing loss, sore throat and ear discharge.    Gastrointestinal: Negative for vomiting, abdominal pain and diarrhea.   Neurological: Negative for headaches.       FEMALE ROS:    REVIEW OF SYSTEMS:  The patient reports no problems with hearing or headaches.  She reports no problems with vision. She is advised to have a yearly eye examination. She has no chest pains or pressures, or shortness of breath.  She has no indigestion, heartburn, nausea, or vomiting.   She has no constipation, diarrhea, or  black, bloody, mucousy, or tarry stool.  She has no trouble passing urine or losing urine.  Libido seems to be normal.  She reports no musculoskeletal aches or pains.  No paresthesias or loss of strength.        Objective:   Physical Exam      PHYSICAL EXAMINATION:   VITAL SIGNS: are as recorded.  GENERAL:  The patient appears well nourished and well-developed, and with normal affect.  No acute respiratory distress. Alert and oriented times 3. No skin rashes.     HEENT:  TMs normal bilaterally.  Canals and ears normal. Pharynx is clear. Extraocular eye motions intact and pain free.   Pupils reactive and equally round.  Sclerae and conjunctivae clear, normocephalic and atraumatic.      RESPIRATORY:  Clear and equal breath sounds with no acute respiratory distress.       HEART: Regular rhythm without murmur, rub or gallop.     ABDOMEN:  soft, nontender. No  masses, guarding or rebound. Normoactive bowel sounds.     NECK: No masses or adenopathy palpable.  No bruits heard. No asymmetry visible.     COMPLETE EXTREMITIES: No edema, all four extremities with posterior tibial and radial pulses strong and palpable.     EXTREMITIES:  no edema in any extremity. No cyanosis or clubbing.    LOW BACK: No tenderness to palpation of inferior lumbar spine or either sacroiliac joint area.       Assessment:      Encounter Diagnoses   Name Primary?   . Breast cancer    . Hypertension    . Type II or unspecified type diabetes mellitus without mention of complication, not stated as uncontrolled    . HIGH CHOLESTEROL    . Osteopenia    . GERD (gastroesophageal reflux disease)    . Other malaise and fatigue    . Need for influenza vaccination      Current Outpatient Prescriptions on File Prior to Visit   Medication Sig Dispense Refill   . glimepiride (AMARYL) 4 MG tablet qd  90 tablet  0   . alendronate (FOSAMAX) 70 MG tablet Take 1 tablet by mouth every 7 days.  12 tablet  1   . atorvastatin (LIPITOR) 10 MG tablet Take 1 tablet  by mouth daily.  90 tablet  0   . esomeprazole (NEXIUM) 40 MG capsule Take 1 capsule by mouth every morning (before breakfast).  90 capsule  0   . amLODIPine (NORVASC) 10 MG tablet Take 1 tablet by mouth daily.  90 tablet  0   . metformin (GLUCOPHAGE-XR) 500 MG XR tablet Take 1 tablet by mouth 4 times daily.  360 tablet  0   . sitaGLIPtan (JANUVIA) 100 MG tablet Take 1 tablet by mouth daily.  90 tablet  1   . Glucose Blood (BAYER BREEZE 2 TEST VI) by In Vitro route.         Marland Kitchen BAYER MICROLET LANCETS MISC by Does not apply route.  100 each  1   . valsartan (DIOVAN) 160 MG tablet Take 1 tablet by mouth daily.  90 tablet  0   . Blood Glucose Monitoring Suppl (BAYER BREEZE 2 SYSTEM) W/DEVICE KIT by Does not apply route.  3 kit  0           Plan:      Orders Placed This Encounter   Medications   . glimepiride (AMARYL) 4 MG tablet     Sig: qd     Dispense:  90 tablet      Refill:  0   . valsartan (DIOVAN) 80 MG tablet     Sig: Take 1 tablet by mouth daily.     Dispense:  90 tablet     Refill:  0   . alendronate (FOSAMAX) 70 MG tablet     Sig: Take 1 tablet by mouth every 7 days.     Dispense:  12 tablet     Refill:  1   . atorvastatin (LIPITOR) 10 MG tablet     Sig: Take 1 tablet by mouth daily.     Dispense:  90 tablet     Refill:  0   . esomeprazole (NEXIUM) 40 MG capsule     Sig: Take 1 capsule by mouth every morning (before breakfast).     Dispense:  90 capsule     Refill:  0   . amLODIPine (NORVASC) 10 MG tablet     Sig: Take 1 tablet by mouth daily.     Dispense:  90 tablet     Refill:  0   . metformin (GLUCOPHAGE-XR) 500 MG XR tablet     Sig: Take 1 tablet by mouth 4 times daily.     Dispense:  360 tablet     Refill:  0   . sitaGLIPtan (JANUVIA) 100 MG tablet     Sig: Take 1 tablet by mouth daily.     Dispense:  90 tablet     Refill:  1     Orders Placed This Encounter   Procedures   . Flu Vaccine greater than or = 72yo IM   . CBC auto differential   . Comprehensive metabolic panel   . Lipid panel     Order Specific Question:  Is Patient Fasting?/# of Hours     Answer:  yes   . Hemoglobin A1c   . CBC     This order was created through External Result Entry     Health Maintenance Due   Topic Date Due   . Tetanus Vaccine Adult (11 Years And Up)  11/09/1951   . Zostavax Vaccine  11/10/1998   . Pneumovax Vaccine Adult (#1) 11/10/2003   . Dexa Scan Women  11/10/2003  Symptoms and complaints should be improving over 48 hours and entirely resolved in two weeks, if not, patient should call the office for a follow-up appointment.    Patient refuses colonoscopy. Life threatening complications and end-organ damage of not proceeding with recommendations was explained to patient.     See pv.

## 2011-03-02 LAB — LIPID PANEL
Chol/HDL Ratio: 3.1
Cholesterol: 138 mg/dL (ref 0–199)
HDL: 45 mg/dL (ref 40–59)
LDL Calculated: 63 mg/dL (ref 0–129)
LDL/HDL Ratio: 1.4 (ref 0.0–2.9)
Triglycerides: 190 mg/dL — ABNORMAL HIGH (ref 0–149)

## 2011-03-02 LAB — COMPREHENSIVE METABOLIC PANEL
ALT: 78 U/L — ABNORMAL HIGH (ref 0–63)
AST: 47 U/L — ABNORMAL HIGH (ref 0–35)
Albumin: 4.8 g/dL (ref 3.5–5.2)
Alk Phosphatase: 65 U/L (ref 40–135)
Anion Gap: 12 mEq/L (ref 7–13)
BUN: 20 mg/dL (ref 10–26)
CO2: 26 mEq/L (ref 22–32)
Calcium: 10.6 mg/dL — ABNORMAL HIGH (ref 8.5–10.2)
Chloride: 103 mEq/L (ref 99–111)
Creatinine: 0.92 mg/dL (ref 0.50–1.10)
GFR African American: 72.5
GFR Non-African American: 60
Globulin: 2.8 g/dL (ref 2.3–3.5)
Glucose: 173 mg/dL — ABNORMAL HIGH (ref 60–115)
Potassium: 5.8 mEq/L — ABNORMAL HIGH (ref 3.5–5.5)
Sodium: 141 mEq/L (ref 135–146)
Total Bilirubin: 0.2 mg/dL (ref 0.2–1.1)
Total Protein: 7.6 g/dL (ref 6.4–8.1)

## 2011-03-02 LAB — CBC WITH DIFFERENTIAL
Basophils %: 0.2 % (ref 0.0–2.0)
Basophils Absolute: 0 10*3/uL (ref 0.0–0.2)
Eosinophils %: 1.7 % (ref 0.0–4.0)
Eosinophils Absolute: 0.2 10*3/uL (ref 0.0–0.7)
Granulocyte Absolute Count: 7.6 10*3/uL — ABNORMAL HIGH (ref 1.4–6.5)
Granulocytes, Relative Percent: 69.7 % (ref 42.2–75.2)
Hematocrit: 40 % (ref 37.0–47.0)
Hemoglobin: 12.9 g/dL (ref 12.0–16.0)
Lymphocytes %: 23.5 % (ref 20.5–51.1)
Lymphocytes Absolute: 2.6 10*3/uL (ref 1.0–4.8)
MCH: 28.6 pg (ref 27.0–31.0)
MCHC: 32.2 % — ABNORMAL LOW (ref 33.0–37.0)
MCV: 88.8 fL (ref 82.0–100.0)
MPV: 12.2 fL — ABNORMAL HIGH (ref 7.4–10.4)
Monocytes %: 4.9 % (ref 2.0–11.0)
Monocytes Absolute: 0.5 10*3/uL (ref 0.2–0.8)
Platelets: 217 10*3/uL (ref 130–400)
RBC: 4.51 10*6/uL (ref 4.20–5.40)
RDW: 14.8 % — ABNORMAL HIGH (ref 11.5–14.5)
WBC: 10.9 10*3/uL — ABNORMAL HIGH (ref 4.8–10.8)

## 2011-03-02 LAB — HEMOGLOBIN A1C: Hemoglobin A1C: 8.3 %HbA1c — ABNORMAL HIGH (ref 4.2–5.9)

## 2011-04-27 LAB — POCT URINALYSIS DIPSTICK
Blood, UA POC: NEGATIVE
Glucose, UA POC: NEGATIVE
Ketones, UA: NEGATIVE
Nitrite, UA: NEGATIVE
Protein, UA POC: 30
Spec Grav, UA: 1.02
Urobilinogen, UA: 2
pH, UA: 5

## 2011-04-27 MED ORDER — NITROFURANTOIN MONOHYD MACRO 100 MG PO CAPS
100 MG | ORAL_CAPSULE | Freq: Two times a day (BID) | ORAL | Status: AC
Start: 2011-04-27 — End: 2011-05-07

## 2011-04-27 NOTE — Progress Notes (Signed)
Subjective:      Patient ID: Lori Chase is a 70 y.o. female.    Urinary Tract Infection   This is a recurrent problem. The current episode started 1 to 4 weeks ago. The problem occurs every urination. The problem has been gradually worsening. The quality of the pain is described as aching and burning. The pain is at a severity of 6/10. The pain is moderate. There has been no fever. She is not sexually active. There is no history of pyelonephritis. Associated symptoms include frequency and urgency. Pertinent negatives include no chills, discharge, flank pain, hematuria, hesitancy, nausea, possible pregnancy, sweats or vomiting. She has tried nothing for the symptoms. The treatment provided no relief. There is no history of catheterization, kidney stones, recurrent UTIs, a single kidney, urinary stasis or a urological procedure.       Review of Systems   Constitutional: Negative for chills.   Gastrointestinal: Negative for nausea and vomiting.   Genitourinary: Positive for urgency and frequency. Negative for hesitancy, hematuria and flank pain.       Objective:   Physical Exam      PHYSICAL EXAMINATION:   VITAL SIGNS: are as recorded.  GENERAL:  The patient appears well nourished and well-developed, and with normal affect.  No acute respiratory distress. Alert and oriented times 3. No skin rashes.     HEENT:  TMs normal bilaterally.  Canals and ears normal. Pharynx is clear. Extraocular eye motions intact and pain free.   Pupils reactive and equally round.  Sclerae and conjunctivae clear, normocephalic and atraumatic.      RESPIRATORY:  Clear and equal breath sounds with no acute respiratory distress.       HEART: Regular rhythm without murmur, rub or gallop.     ABDOMEN:  soft, nontender. No masses, guarding or rebound. Normoactive bowel sounds.     NECK: No masses or adenopathy palpable.  No bruits heard. No asymmetry visible.     COMPLETE EXTREMITIES: No edema, all four extremities with posterior tibial and  radial pulses strong and palpable.     EXTREMITIES:  no edema in any extremity. No cyanosis or clubbing.    LOW BACK: No tenderness to palpation of inferior lumbar spine or either sacroiliac joint area.       Assessment:      Encounter Diagnoses   Name Primary?   . UTI (urinary tract infection) Yes   . Hypertension    . Type II or unspecified type diabetes mellitus without mention of complication, not stated as uncontrolled    . HIGH CHOLESTEROL    . GERD (gastroesophageal reflux disease)      Current Outpatient Prescriptions on File Prior to Visit   Medication Sig Dispense Refill   . glimepiride (AMARYL) 4 MG tablet qd  90 tablet  0   . valsartan (DIOVAN) 80 MG tablet Take 1 tablet by mouth daily.  90 tablet  0   . alendronate (FOSAMAX) 70 MG tablet Take 1 tablet by mouth every 7 days.  12 tablet  1   . atorvastatin (LIPITOR) 10 MG tablet Take 1 tablet by mouth daily.  90 tablet  0   . esomeprazole (NEXIUM) 40 MG capsule Take 1 capsule by mouth every morning (before breakfast).  90 capsule  0   . amLODIPine (NORVASC) 10 MG tablet Take 1 tablet by mouth daily.  90 tablet  0   . metformin (GLUCOPHAGE-XR) 500 MG XR tablet Take 1 tablet by mouth 4 times  daily.  360 tablet  0   . sitaGLIPtan (JANUVIA) 100 MG tablet Take 1 tablet by mouth daily.  90 tablet  1   . Glucose Blood (BAYER BREEZE 2 TEST VI) by In Vitro route.         Marland Kitchen BAYER MICROLET LANCETS MISC by Does not apply route.  100 each  1   . valsartan (DIOVAN) 160 MG tablet Take 1 tablet by mouth daily.  90 tablet  0   . Blood Glucose Monitoring Suppl (BAYER BREEZE 2 SYSTEM) W/DEVICE KIT by Does not apply route.  3 kit  0             Plan:      Orders Placed This Encounter   Medications   . nitrofurantoin, macrocrystal-monohydrate, (MACROBID) 100 MG capsule     Sig: Take 1 capsule by mouth 2 times daily for 10 days.     Dispense:  20 capsule     Refill:  0     Orders Placed This Encounter   Procedures   . Urine culture     Order Specific Question:  Specify  (ex-cath, midstream, cysto, etc)?     Answer:  clean catch   . POCT urinalysis dipstick     Health Maintenance Due   Topic Date Due   . Tetanus Vaccine Adult (11 Years And Up)  [birth year]09/1951   . Zostavax Vaccine  11/10/1998   . Pneumovax Vaccine Adult (#1) 11/10/2003   . Dexa Scan Women  11/10/2003     Symptoms and complaints should be improving over 48 hours and entirely resolved in two weeks, if not, patient should call the office for a follow-up appointment.    See pv.    All BW next.    U/A next.

## 2011-05-12 NOTE — Progress Notes (Addendum)
Subjective:      Patient ID: Lori Chase is a 70 y.o. female.    Urinary Tract Infection   This is a recurrent problem. The current episode started 1 to 4 weeks ago. The problem occurs intermittently. The problem has been gradually improving. The pain is at a severity of 0/10. The patient is experiencing no pain. There has been no fever. The fever has been present for less than 1 day. She is sexually active. There is no history of pyelonephritis. Pertinent negatives include no chills, discharge, flank pain, frequency, hematuria, hesitancy, nausea, possible pregnancy, sweats, urgency or vomiting. She has tried acetaminophen for the symptoms. The treatment provided significant relief. There is no history of catheterization, kidney stones, recurrent UTIs, a single kidney, urinary stasis or a urological procedure.       Review of Systems   Constitutional: Negative for chills.   Gastrointestinal: Negative for nausea and vomiting.   Genitourinary: Negative for hesitancy, urgency, frequency, hematuria and flank pain.             FEMALE ROS:    REVIEW OF SYSTEMS:  The patient reports no problems with hearing or headaches.  She reports no problems with vision. She is advised to have a yearly eye examination. She has no chest pains or pressures, or shortness of breath.  She has no indigestion, heartburn, nausea, or vomiting.   She has no constipation, diarrhea, or  black, bloody, mucousy, or tarry stool.  She has no trouble passing urine or losing urine.  Libido seems to be normal.  She reports no musculoskeletal aches or pains.  No paresthesias or loss of strength.            Objective:   Physical Exam      PHYSICAL EXAMINATION:   VITAL SIGNS: are as recorded.  GENERAL:  The patient appears well nourished and well-developed, and with normal affect.  No acute respiratory distress. Alert and oriented times 3. No skin rashes.     HEENT:  TMs normal bilaterally.  Canals and ears normal. Pharynx is clear. Extraocular eye  motions intact and pain free.   Pupils reactive and equally round.  Sclerae and conjunctivae clear, normocephalic and atraumatic.      RESPIRATORY:  Clear and equal breath sounds with no acute respiratory distress.       HEART: Regular rhythm without murmur, rub or gallop.     ABDOMEN:  soft, nontender. No masses, guarding or rebound. Normoactive bowel sounds.     NECK: No masses or adenopathy palpable.  No bruits heard. No asymmetry visible.     COMPLETE EXTREMITIES: No edema, all four extremities with posterior tibial and radial pulses strong and palpable.     EXTREMITIES:  no edema in any extremity. No cyanosis or clubbing.    LOW BACK: No tenderness to palpation of inferior lumbar spine or either sacroiliac joint area.       Assessment:      Encounter Diagnoses   Name Primary?   . Type II or unspecified type diabetes mellitus without mention of complication, not stated as uncontrolled    . Hypertension    . Cancer    . HIGH CHOLESTEROL    . GERD (gastroesophageal reflux disease)    . Osteopenia    . Other malaise and fatigue      Current Outpatient Prescriptions on File Prior to Visit   Medication Sig Dispense Refill   . glimepiride (AMARYL) 4 MG tablet qd  90  tablet  0   . valsartan (DIOVAN) 80 MG tablet Take 1 tablet by mouth daily.  90 tablet  0   . alendronate (FOSAMAX) 70 MG tablet Take 1 tablet by mouth every 7 days.  12 tablet  1   . atorvastatin (LIPITOR) 10 MG tablet Take 1 tablet by mouth daily.  90 tablet  0   . esomeprazole (NEXIUM) 40 MG capsule Take 1 capsule by mouth every morning (before breakfast).  90 capsule  0   . amLODIPine (NORVASC) 10 MG tablet Take 1 tablet by mouth daily.  90 tablet  0   . metformin (GLUCOPHAGE-XR) 500 MG XR tablet Take 1 tablet by mouth 4 times daily.  360 tablet  0   . sitaGLIPtan (JANUVIA) 100 MG tablet Take 1 tablet by mouth daily.  90 tablet  1   . Glucose Blood (BAYER BREEZE 2 TEST VI) by In Vitro route.         Marland Kitchen BAYER MICROLET LANCETS MISC by Does not apply  route.  100 each  1   . valsartan (DIOVAN) 160 MG tablet Take 1 tablet by mouth daily.  90 tablet  0   . Blood Glucose Monitoring Suppl (BAYER BREEZE 2 SYSTEM) W/DEVICE KIT by Does not apply route.  3 kit  0             Plan:    No orders of the defined types were placed in this encounter.     Orders Placed This Encounter   Procedures   . CBC auto differential   . Comprehensive metabolic panel   . Lipid panel     Order Specific Question:  Is Patient Fasting?/# of Hours     Answer:  yes   . Hemoglobin A1c     Health Maintenance Due   Topic Date Due   . Tetanus Vaccine Adult (11 Years And Up)  [birth year]09/1951   . Zostavax Vaccine  11/10/1998   . Pneumovax Vaccine Adult (#1) 11/10/2003   . Dexa Scan Women  11/10/2003     Symptoms and complaints should be improving over 48 hours and entirely resolved in two weeks, if not, patient should call the office for a follow-up appointment.    zostavax next .    Td &  pneumovax are UTD.    See pv.    Check knee next.    Check BP next.    U/A next

## 2011-05-13 LAB — COMPREHENSIVE METABOLIC PANEL
ALT: 73 U/L — ABNORMAL HIGH (ref 0–63)
AST: 46 U/L — ABNORMAL HIGH (ref 0–35)
Albumin: 4.5 g/dL (ref 3.5–5.2)
Alk Phosphatase: 67 U/L (ref 40–135)
Anion Gap: 13 mEq/L (ref 7–13)
BUN: 19 mg/dL (ref 10–26)
CO2: 26 mEq/L (ref 22–32)
Calcium: 9.7 mg/dL (ref 8.5–10.2)
Chloride: 102 mEq/L (ref 99–111)
Creatinine: 0.81 mg/dL (ref 0.50–1.10)
GFR African American: 84
GFR Non-African American: 69.4
Globulin: 2.9 g/dL (ref 2.3–3.5)
Glucose: 261 mg/dL — ABNORMAL HIGH (ref 60–115)
Potassium: 5.8 mEq/L — ABNORMAL HIGH (ref 3.5–5.5)
Sodium: 141 mEq/L (ref 135–146)
Total Bilirubin: 0.3 mg/dL (ref 0.2–1.1)
Total Protein: 7.4 g/dL (ref 6.4–8.1)

## 2011-05-13 LAB — CBC WITH DIFFERENTIAL
Basophils %: 0.5 % (ref 0.0–2.0)
Basophils Absolute: 0 10*3/uL (ref 0.0–0.2)
Eosinophils %: 3 % (ref 0.0–4.0)
Eosinophils Absolute: 0.3 10*3/uL (ref 0.0–0.7)
Granulocyte Absolute Count: 5.3 10*3/uL (ref 1.4–6.5)
Granulocytes, Relative Percent: 63.3 % (ref 42.2–75.2)
Hematocrit: 38.7 % (ref 37.0–47.0)
Hemoglobin: 12.6 g/dL (ref 12.0–16.0)
Lymphocytes %: 25.8 % (ref 20.5–51.1)
Lymphocytes Absolute: 2.2 10*3/uL (ref 1.0–4.8)
MCH: 28.9 pg (ref 27.0–31.0)
MCHC: 32.6 % — ABNORMAL LOW (ref 33.0–37.0)
MCV: 88.7 fL (ref 82.0–100.0)
MPV: 11.4 fL — ABNORMAL HIGH (ref 7.4–10.4)
Monocytes %: 7.4 % (ref 2.0–11.0)
Monocytes Absolute: 0.6 10*3/uL (ref 0.2–0.8)
Platelets: 199 10*3/uL (ref 130–400)
RBC: 4.36 10*6/uL (ref 4.20–5.40)
RDW: 15 % — ABNORMAL HIGH (ref 11.5–14.5)
WBC: 8.5 10*3/uL (ref 4.8–10.8)

## 2011-05-13 LAB — LIPID PANEL
Chol/HDL Ratio: 3.3
Cholesterol: 131 mg/dL (ref 0–199)
HDL: 40 mg/dL (ref 40–59)
LDL Calculated: 62 mg/dL (ref 0–129)
LDL/HDL Ratio: 1.5 (ref 0.0–2.9)
Triglycerides: 182 mg/dL — ABNORMAL HIGH (ref 0–149)

## 2011-05-13 LAB — HEMOGLOBIN A1C: Hemoglobin A1C: 8.4 %HbA1c — ABNORMAL HIGH (ref 4.2–5.9)

## 2011-06-14 MED ORDER — GLIMEPIRIDE 4 MG PO TABS
4 MG | ORAL_TABLET | ORAL | Status: DC
Start: 2011-06-14 — End: 2011-08-30

## 2011-06-14 MED ORDER — ALENDRONATE SODIUM 70 MG PO TABS
70 MG | ORAL_TABLET | ORAL | Status: DC
Start: 2011-06-14 — End: 2011-08-30

## 2011-06-14 MED ORDER — ESOMEPRAZOLE MAGNESIUM 40 MG PO CPDR
40 MG | ORAL_CAPSULE | Freq: Every day | ORAL | Status: DC
Start: 2011-06-14 — End: 2011-08-30

## 2011-06-14 MED ORDER — SITAGLIPTIN PHOSPHATE 100 MG PO TABS
100 MG | ORAL_TABLET | Freq: Every day | ORAL | Status: DC
Start: 2011-06-14 — End: 2011-08-30

## 2011-06-14 MED ORDER — VALSARTAN 80 MG PO TABS
80 MG | ORAL_TABLET | Freq: Every day | ORAL | Status: DC
Start: 2011-06-14 — End: 2011-08-30

## 2011-06-14 MED ORDER — ATORVASTATIN CALCIUM 10 MG PO TABS
10 MG | ORAL_TABLET | Freq: Every day | ORAL | Status: DC
Start: 2011-06-14 — End: 2011-08-30

## 2011-06-14 MED ORDER — METFORMIN HCL ER 500 MG PO TB24
500 MG | ORAL_TABLET | Freq: Four times a day (QID) | ORAL | Status: DC
Start: 2011-06-14 — End: 2011-08-30

## 2011-06-14 MED ORDER — AMLODIPINE BESYLATE 10 MG PO TABS
10 MG | ORAL_TABLET | Freq: Every day | ORAL | Status: DC
Start: 2011-06-14 — End: 2011-08-30

## 2011-06-14 MED ORDER — VALSARTAN 160 MG PO TABS
160 MG | ORAL_TABLET | Freq: Every day | ORAL | Status: DC
Start: 2011-06-14 — End: 2011-06-16

## 2011-06-14 NOTE — Telephone Encounter (Signed)
Call pt .Prescription sent to pharmacy. Have pt fu office visit this week or next.

## 2011-06-16 NOTE — Progress Notes (Signed)
Subjective:      Patient ID: Lori Chase is a 71 y.o. female.    Other  This is a recurrent problem. The current episode started more than 1 month ago. The problem occurs constantly. The problem has been unchanged. Pertinent negatives include no abdominal pain, anorexia, arthralgias, change in bowel habit, chest pain, chills, congestion, coughing, diaphoresis, fatigue, fever, headaches, joint swelling, myalgias, nausea, neck pain, numbness, rash, sore throat, swollen glands, urinary symptoms, vertigo, visual change, vomiting or weakness. Nothing aggravates the symptoms. She has tried nothing for the symptoms. The treatment provided no relief.       Review of Systems   Constitutional: Negative for fever, chills, diaphoresis and fatigue.   HENT: Negative for congestion, sore throat and neck pain.    Respiratory: Negative for cough.    Cardiovascular: Negative for chest pain.   Gastrointestinal: Negative for nausea, vomiting, abdominal pain, anorexia and change in bowel habit.   Musculoskeletal: Negative for myalgias, joint swelling and arthralgias.   Skin: Negative for rash.   Neurological: Negative for vertigo, weakness, numbness and headaches.       Objective:   Physical Exam      PHYSICAL EXAMINATION:   VITAL SIGNS: are as recorded.  GENERAL:  The patient appears well nourished and well-developed, and with normal affect.  No acute respiratory distress. Alert and oriented times 3. No skin rashes.     HEENT:  TMs normal bilaterally.  Canals and ears normal. Pharynx is clear. Extraocular eye motions intact and pain free.   Pupils reactive and equally round.  Sclerae and conjunctivae clear, normocephalic and atraumatic.      RESPIRATORY:  Clear and equal breath sounds with no acute respiratory distress.       HEART: Regular rhythm without murmur, rub or gallop.     ABDOMEN:  soft, nontender. No masses, guarding or rebound. Normoactive bowel sounds.     NECK: No masses or adenopathy palpable.  No bruits heard. No  asymmetry visible.     COMPLETE EXTREMITIES: No edema, all four extremities with posterior tibial and radial pulses strong and palpable.     EXTREMITIES:  no edema in any extremity. No cyanosis or clubbing. OA L knee      LOW BACK: No tenderness to palpation of inferior lumbar spine or either sacroiliac joint area.       Assessment:       Encounter Diagnoses   Name Primary?   Marland Kitchen HIGH CHOLESTEROL Yes   . OA (osteoarthritis) of knee    . Hypertension    . GERD (gastroesophageal reflux disease)    . Osteopenia      Current Outpatient Prescriptions on File Prior to Visit   Medication Sig Dispense Refill   . glimepiride (AMARYL) 4 MG tablet qd  90 tablet  0   . valsartan (DIOVAN) 80 MG tablet Take 1 tablet by mouth daily.  1 tablet  0   . alendronate (FOSAMAX) 70 MG tablet Take 1 tablet by mouth every 7 days.  12 tablet  1   . atorvastatin (LIPITOR) 10 MG tablet Take 1 tablet by mouth daily.  90 tablet  0   . esomeprazole (NEXIUM) 40 MG capsule Take 1 capsule by mouth every morning (before breakfast).  90 capsule  0   . amLODIPine (NORVASC) 10 MG tablet Take 1 tablet by mouth daily.  90 tablet  0   . metformin (GLUCOPHAGE-XR) 500 MG XR tablet Take 1 tablet by mouth 4 times  daily.  360 tablet  0   . sitaGLIPtan (JANUVIA) 100 MG tablet Take 1 tablet by mouth daily.  90 tablet  1   . Glucose Blood (BAYER BREEZE 2 TEST VI) by In Vitro route.         Marland Kitchen BAYER MICROLET LANCETS MISC by Does not apply route.  100 each  1   . Blood Glucose Monitoring Suppl (BAYER BREEZE 2 SYSTEM) W/DEVICE KIT by Does not apply route.  3 kit  0     No current facility-administered medications on file prior to visit.             Plan:     No orders of the defined types were placed in this encounter.     Orders Placed This Encounter   Procedures   . PR TRIAMCINOLONE ACETONIDE INJ     80 mg   . PR DRAIN/INJECT LARGE JOINT/BURSA     Health Maintenance Due   Topic Date Due   . Tetanus Vaccine Adult (11 Years And Up)  11/09/1951   . Zostavax Vaccine   11/10/1998   . Pneumovax Vaccine Adult (#1) 11/10/2003   . Dexa Scan Women  11/10/2003     Risks were explained.  Consent was obtained.  Joint was injected with Kenalog & Lidocaine.  Pt tolerated procedure well.  There were no complications. L knee.    U/A ,zostavax ,XR knee next.    See pv.    Lose wt.    PT &ice.

## 2011-07-17 MED ORDER — ALENDRONATE SODIUM 70 MG PO TABS
70 MG | ORAL_TABLET | ORAL | Status: DC
Start: 2011-07-17 — End: 2011-08-30

## 2011-07-26 NOTE — Progress Notes (Signed)
Subjective:      Patient ID: Lori Chase is a 71 y.o. female.    Hyperlipidemia  This is a recurrent problem. The current episode started more than 1 month ago. The problem is controlled. Recent lipid tests were reviewed and are normal. She has no history of chronic renal disease, diabetes, hypothyroidism, liver disease, obesity or nephrotic syndrome. There are no known factors aggravating her hyperlipidemia. Pertinent negatives include no chest pain, focal sensory loss, focal weakness, leg pain, myalgias or shortness of breath. Current antihyperlipidemic treatment includes statins. The current treatment provides moderate improvement of lipids. There are no compliance problems.  Risk factors for coronary artery disease include hypertension.       Review of Systems   Respiratory: Negative for shortness of breath.    Cardiovascular: Negative for chest pain.   Musculoskeletal: Negative for myalgias.   Neurological: Negative for focal weakness.       Objective:   Physical Exam      PHYSICAL EXAMINATION:   VITAL SIGNS: are as recorded.  GENERAL:  The patient appears well nourished and well-developed, and with normal affect.  No acute respiratory distress. Alert and oriented times 3. No skin rashes.     HEENT:  TMs normal bilaterally.  Canals and ears normal. Pharynx is clear. Extraocular eye motions intact and pain free.   Pupils reactive and equally round.  Sclerae and conjunctivae clear, normocephalic and atraumatic.      RESPIRATORY:  Clear and equal breath sounds with no acute respiratory distress.       HEART: Regular rhythm without murmur, rub or gallop.     ABDOMEN:  soft, nontender. No masses, guarding or rebound. Normoactive bowel sounds.     NECK: No masses or adenopathy palpable.  No bruits heard. No asymmetry visible.     COMPLETE EXTREMITIES: No edema, all four extremities with posterior tibial and radial pulses strong and palpable.     EXTREMITIES:  no edema in any extremity. No cyanosis or  clubbing.    LOW BACK: No tenderness to palpation of inferior lumbar spine or either sacroiliac joint area.       Assessment:       Encounter Diagnoses   Name Primary?   ??? Type II or unspecified type diabetes mellitus without mention of complication, not stated as uncontrolled Yes   ??? Hypertension    ??? HIGH CHOLESTEROL    ??? GERD (gastroesophageal reflux disease)    ??? Osteopenia    ??? Other malaise and fatigue      Current Outpatient Prescriptions on File Prior to Visit   Medication Sig Dispense Refill   ??? alendronate (FOSAMAX) 70 MG tablet TAKE 1 TABLET BY MOUTH ONCE A WEEK  12 tablet  1   ??? glimepiride (AMARYL) 4 MG tablet qd  90 tablet  0   ??? valsartan (DIOVAN) 80 MG tablet Take 1 tablet by mouth daily.  1 tablet  0   ??? alendronate (FOSAMAX) 70 MG tablet Take 1 tablet by mouth every 7 days.  12 tablet  1   ??? atorvastatin (LIPITOR) 10 MG tablet Take 1 tablet by mouth daily.  90 tablet  0   ??? esomeprazole (NEXIUM) 40 MG capsule Take 1 capsule by mouth every morning (before breakfast).  90 capsule  0   ??? amLODIPine (NORVASC) 10 MG tablet Take 1 tablet by mouth daily.  90 tablet  0   ??? metformin (GLUCOPHAGE-XR) 500 MG XR tablet Take 1 tablet by mouth  4 times daily.  360 tablet  0   ??? sitaGLIPtan (JANUVIA) 100 MG tablet Take 1 tablet by mouth daily.  90 tablet  1   ??? Glucose Blood (BAYER BREEZE 2 TEST VI) by In Vitro route.         ??? BAYER MICROLET LANCETS MISC by Does not apply route.  100 each  1   ??? Blood Glucose Monitoring Suppl (BAYER BREEZE 2 SYSTEM) W/DEVICE KIT by Does not apply route.  3 kit  0     No current facility-administered medications on file prior to visit.             Plan:     No orders of the defined types were placed in this encounter.     Orders Placed This Encounter   Procedures   ??? CBC auto differential   ??? Comprehensive metabolic panel   ??? Lipid panel     Order Specific Question:  Is Patient Fasting?/# of Hours     Answer:  yes   ??? Hemoglobin A1c     Health Maintenance Due   Topic Date Due   ???  Tetanus Vaccine Adult (11 Years And Up)  11/09/1951   ??? Zostavax Vaccine  11/10/1998   ??? Pneumovax Vaccine Adult (#1) 11/10/2003   ??? Dexa Scan Women  11/10/2003     No Follow-up on file.    Patient refuses zostavax. Life threatening complications and end-organ damage of not proceeding with recommendations was explained to patient.     Lose wt.    Symptoms and complaints should be improving over 48 hours and entirely resolved in two weeks, if not, patient should call the office for a follow-up appointment.    XR L knee & U/A next.    check on Td & pneumovax.    See pv.    Mg oxide 500 mg bid & propel qd.

## 2011-07-27 LAB — LIPID PANEL
Chol/HDL Ratio: 2.7
Cholesterol: 156 mg/dL (ref 0–199)
HDL: 58 mg/dL (ref 40–59)
LDL Calculated: 74 mg/dL (ref 0–129)
LDL/HDL Ratio: 1.3 (ref 0.0–2.9)
Triglycerides: 149 mg/dL (ref 0–149)

## 2011-07-27 LAB — COMPREHENSIVE METABOLIC PANEL
ALT: 54 U/L (ref 0–63)
AST: 5 U/L (ref 0–35)
Albumin: 4.8 g/dL (ref 3.5–5.2)
Alk Phosphatase: 71 U/L (ref 40–135)
Anion Gap: 11 mEq/L (ref 7–13)
BUN: 16 mg/dL (ref 10–26)
CO2: 28 mEq/L (ref 22–32)
Calcium: 10.1 mg/dL (ref 8.5–10.2)
Chloride: 100 mEq/L (ref 99–111)
Creatinine: 0.75 mg/dL (ref 0.50–1.10)
GFR African American: 91.7
GFR Non-African American: 75.8
Globulin: 2.7 g/dL (ref 2.3–3.5)
Glucose: 149 mg/dL — ABNORMAL HIGH (ref 60–115)
Potassium: 5.6 mEq/L — ABNORMAL HIGH (ref 3.5–5.5)
Sodium: 139 mEq/L (ref 135–146)
Total Bilirubin: 0.3 mg/dL (ref 0.2–1.1)
Total Protein: 7.5 g/dL (ref 6.4–8.1)

## 2011-07-27 LAB — CBC WITH DIFFERENTIAL
Basophils %: 0.5 % (ref 0.0–2.0)
Basophils Absolute: 0.1 10*3/uL (ref 0.0–0.2)
Eosinophils %: 1 % (ref 0.0–4.0)
Eosinophils Absolute: 0.1 10*3/uL (ref 0.0–0.7)
Granulocyte Absolute Count: 8.4 10*3/uL — ABNORMAL HIGH (ref 1.4–6.5)
Granulocytes, Relative Percent: 71.1 % (ref 42.2–75.2)
Hematocrit: 41.2 % (ref 37.0–47.0)
Hemoglobin: 13 g/dL (ref 12.0–16.0)
Lymphocytes %: 22.3 % (ref 20.5–51.1)
Lymphocytes Absolute: 2.6 10*3/uL (ref 1.0–4.8)
MCH: 27.9 pg (ref 27.0–31.0)
MCHC: 31.5 % — ABNORMAL LOW (ref 33.0–37.0)
MCV: 88.6 fL (ref 82.0–100.0)
MPV: 11.4 fL — ABNORMAL HIGH (ref 7.4–10.4)
Monocytes %: 5.1 % (ref 2.0–11.0)
Monocytes Absolute: 0.6 10*3/uL (ref 0.2–0.8)
Platelets: 207 10*3/uL (ref 130–400)
RBC: 4.65 10*6/uL (ref 4.20–5.40)
RDW: 15.2 % — ABNORMAL HIGH (ref 11.5–14.5)
WBC: 11.8 10*3/uL — ABNORMAL HIGH (ref 4.8–10.8)

## 2011-07-27 LAB — HEMOGLOBIN A1C: Hemoglobin A1C: 9.4 %HbA1c — ABNORMAL HIGH (ref 4.2–5.9)

## 2011-08-30 MED ORDER — METFORMIN HCL ER 500 MG PO TB24
500 MG | ORAL_TABLET | Freq: Four times a day (QID) | ORAL | Status: DC
Start: 2011-08-30 — End: 2012-02-23

## 2011-08-30 MED ORDER — ALBUTEROL SULFATE HFA 108 (90 BASE) MCG/ACT IN AERS
108 (90 Base) MCG/ACT | Freq: Four times a day (QID) | RESPIRATORY_TRACT | Status: DC
Start: 2011-08-30 — End: 2012-03-16

## 2011-08-30 MED ORDER — GLIMEPIRIDE 4 MG PO TABS
4 MG | ORAL_TABLET | ORAL | Status: DC
Start: 2011-08-30 — End: 2012-02-23

## 2011-08-30 MED ORDER — ATORVASTATIN CALCIUM 10 MG PO TABS
10 MG | ORAL_TABLET | Freq: Every day | ORAL | Status: DC
Start: 2011-08-30 — End: 2012-02-23

## 2011-08-30 MED ORDER — VALSARTAN 80 MG PO TABS
80 MG | ORAL_TABLET | Freq: Every day | ORAL | Status: DC
Start: 2011-08-30 — End: 2012-02-17

## 2011-08-30 MED ORDER — ESOMEPRAZOLE MAGNESIUM 40 MG PO CPDR
40 MG | ORAL_CAPSULE | Freq: Every day | ORAL | Status: DC
Start: 2011-08-30 — End: 2012-02-23

## 2011-08-30 MED ORDER — ASPIRIN 81 MG PO CHEW
81 MG | ORAL_TABLET | Freq: Every day | ORAL | Status: DC
Start: 2011-08-30 — End: 2016-11-16

## 2011-08-30 MED ORDER — ALENDRONATE SODIUM 70 MG PO TABS
70 MG | ORAL_TABLET | ORAL | Status: DC
Start: 2011-08-30 — End: 2012-03-03

## 2011-08-30 MED ORDER — SITAGLIPTIN PHOSPHATE 100 MG PO TABS
100 MG | ORAL_TABLET | Freq: Every day | ORAL | Status: DC
Start: 2011-08-30 — End: 2012-02-23

## 2011-08-30 MED ORDER — AMLODIPINE BESYLATE 10 MG PO TABS
10 MG | ORAL_TABLET | Freq: Every day | ORAL | Status: DC
Start: 2011-08-30 — End: 2012-02-23

## 2011-08-30 MED ORDER — ALENDRONATE SODIUM 70 MG PO TABS
70 MG | ORAL_TABLET | ORAL | Status: DC
Start: 2011-08-30 — End: 2012-03-16

## 2011-08-30 NOTE — Progress Notes (Signed)
Subjective:      Patient ID: Lori Chase is a 71 y.o. female.    Other  This is a recurrent (pt is here today for a follow up and medication refills) problem. The current episode started more than 1 month ago. The problem occurs constantly. The problem has been unchanged. Pertinent negatives include no abdominal pain, anorexia, arthralgias, change in bowel habit, chest pain, chills, congestion, coughing, diaphoresis, fatigue, fever, headaches, joint swelling, myalgias, nausea, neck pain, numbness, rash, sore throat, swollen glands, urinary symptoms, vertigo, visual change, vomiting or weakness. Nothing aggravates the symptoms. She has tried nothing for the symptoms. The treatment provided no relief.       Review of Systems   Constitutional: Negative for fever, chills, diaphoresis and fatigue.   HENT: Negative for congestion, sore throat and neck pain.    Respiratory: Negative for cough.    Cardiovascular: Negative for chest pain.   Gastrointestinal: Negative for nausea, vomiting, abdominal pain, anorexia and change in bowel habit.   Musculoskeletal: Negative for myalgias, joint swelling and arthralgias.   Skin: Negative for rash.   Neurological: Negative for vertigo, weakness, numbness and headaches.       Objective:   Physical Exam      PHYSICAL EXAMINATION:   VITAL SIGNS: are as recorded.  GENERAL:  The patient appears well nourished and well-developed, and with normal affect.  No acute respiratory distress. Alert and oriented times 3. No skin rashes.     HEENT:  TMs normal bilaterally.  Canals and ears normal. Pharynx is clear. Extraocular eye motions intact and pain free.   Pupils reactive and equally round.  Sclerae and conjunctivae clear, normocephalic and atraumatic.      RESPIRATORY:  Clear and equal breath sounds with no acute respiratory distress.       HEART: Regular rhythm without murmur, rub or gallop.     ABDOMEN:  soft, nontender. No masses, guarding or rebound. Normoactive bowel sounds.     NECK:  No masses or adenopathy palpable.  No bruits heard. No asymmetry visible.     COMPLETE EXTREMITIES: No edema, all four extremities with posterior tibial and radial pulses strong and palpable.     EXTREMITIES:  no edema in any extremity. No cyanosis or clubbing.    LOW BACK: No tenderness to palpation of inferior lumbar spine or either sacroiliac joint area.       Assessment:       Encounter Diagnoses   Name Primary?   . Type II or unspecified type diabetes mellitus without mention of complication, not stated as uncontrolled Yes   . Hypertension    . HIGH CHOLESTEROL    . GERD (gastroesophageal reflux disease)    . Osteopenia    . Symptomatic menopausal or female climacteric states      Current Outpatient Prescriptions on File Prior to Visit   Medication Sig Dispense Refill   . ibuprofen (ADVIL;MOTRIN) 600 MG tablet        . albuterol (VENTOLIN HFA) 108 (90 BASE) MCG/ACT inhaler Inhale 2 puffs into the lungs 4 times daily.  1 Inhaler  3   . glimepiride (AMARYL) 4 MG tablet 2 qd  180 tablet  1   . valsartan (DIOVAN) 80 MG tablet Take 1 tablet by mouth daily.  90 tablet  1   . alendronate (FOSAMAX) 70 MG tablet Take 1 tablet by mouth every 7 days.  12 tablet  1   . atorvastatin (LIPITOR) 10 MG tablet Take 1  tablet by mouth daily.  90 tablet  1   . esomeprazole (NEXIUM) 40 MG capsule Take 1 capsule by mouth every morning (before breakfast).  90 capsule  1   . amLODIPine (NORVASC) 10 MG tablet Take 1 tablet by mouth daily.  90 tablet  1   . metformin (GLUCOPHAGE-XR) 500 MG XR tablet Take 1 tablet by mouth 4 times daily.  360 tablet  1   . sitaGLIPtan (JANUVIA) 100 MG tablet Take 1 tablet by mouth daily.  90 tablet  1   . aspirin (ASPIRIN CHILDRENS) 81 MG chewable tablet Take 1 tablet by mouth daily.  90 tablet  3   . Glucose Blood (BAYER BREEZE 2 TEST VI) by In Vitro route.         Marland Kitchen BAYER MICROLET LANCETS MISC by Does not apply route.  100 each  1   . Blood Glucose Monitoring Suppl (BAYER BREEZE 2 SYSTEM) W/DEVICE KIT  by Does not apply route.  3 kit  0     No current facility-administered medications on file prior to visit.             Plan:       Orders Placed This Encounter   Medications   . albuterol (VENTOLIN HFA) 108 (90 BASE) MCG/ACT inhaler     Sig: Inhale 2 puffs into the lungs 4 times daily.     Dispense:  1 Inhaler     Refill:  3   . alendronate (FOSAMAX) 70 MG tablet     Sig: TAKE 1 TABLET BY MOUTH ONCE A WEEK     Dispense:  12 tablet     Refill:  1   . glimepiride (AMARYL) 4 MG tablet     Sig: 2 qd     Dispense:  180 tablet     Refill:  1   . valsartan (DIOVAN) 80 MG tablet     Sig: Take 1 tablet by mouth daily.     Dispense:  90 tablet     Refill:  1   . alendronate (FOSAMAX) 70 MG tablet     Sig: Take 1 tablet by mouth every 7 days.     Dispense:  12 tablet     Refill:  1   . atorvastatin (LIPITOR) 10 MG tablet     Sig: Take 1 tablet by mouth daily.     Dispense:  90 tablet     Refill:  1   . esomeprazole (NEXIUM) 40 MG capsule     Sig: Take 1 capsule by mouth every morning (before breakfast).     Dispense:  90 capsule     Refill:  1   . amLODIPine (NORVASC) 10 MG tablet     Sig: Take 1 tablet by mouth daily.     Dispense:  90 tablet     Refill:  1   . metformin (GLUCOPHAGE-XR) 500 MG XR tablet     Sig: Take 1 tablet by mouth 4 times daily.     Dispense:  360 tablet     Refill:  1   . sitaGLIPtan (JANUVIA) 100 MG tablet     Sig: Take 1 tablet by mouth daily.     Dispense:  90 tablet     Refill:  1   . aspirin (ASPIRIN CHILDRENS) 81 MG chewable tablet     Sig: Take 1 tablet by mouth daily.     Dispense:  90 tablet     Refill:  3  No orders of the defined types were placed in this encounter.     Health Maintenance Due   Topic Date Due   . Dexa Scan Women  11/10/2003   . Pneumovax Vaccine Adult (#1) 05/25/2007     Return in about 4 weeks (around 09/27/2011).    Symptoms and complaints should be improving over 48 hours and entirely resolved in two weeks, if not, patient should call the office for a follow-up  appointment.    Lose wt.    Check sugars qd.    See pv.    See bw.    Colonoscopy next. Refuses now.    Pneumovax next.

## 2011-12-09 LAB — CBC WITH DIFFERENTIAL
Basophils %: 0.4 %
Basophils Absolute: 0 10*3/uL (ref 0.0–0.2)
Eosinophils %: 3.2 %
Eosinophils Absolute: 0.3 10*3/uL (ref 0.0–0.7)
Hematocrit: 39.5 % (ref 37.0–47.0)
Hemoglobin: 12.6 g/dL (ref 12.0–16.0)
Lymphocytes %: 27.5 %
Lymphocytes Absolute: 2.8 10*3/uL (ref 1.0–4.8)
MCH: 27.7 pg (ref 27.0–31.3)
MCHC: 31.9 % — ABNORMAL LOW (ref 33.0–37.0)
MCV: 86.6 fL (ref 82.0–100.0)
MPV: 11 fL — ABNORMAL HIGH (ref 7.4–10.4)
Monocytes %: 6.9 %
Monocytes Absolute: 0.7 10*3/uL (ref 0.2–0.8)
Neutrophils %: 62 %
Neutrophils Absolute: 6.3 10*3/uL (ref 1.4–6.5)
Platelets: 234 10*3/uL (ref 130–400)
RBC: 4.56 M/uL (ref 4.20–5.40)
RDW: 14.9 % — ABNORMAL HIGH (ref 11.5–14.5)
WBC: 10.1 10*3/uL (ref 4.8–10.8)

## 2011-12-09 MED ORDER — CEFDINIR 300 MG PO CAPS
300 MG | ORAL_CAPSULE | ORAL | Status: AC
Start: 2011-12-09 — End: 2011-12-19

## 2011-12-09 NOTE — Progress Notes (Signed)
Subjective:      Patient ID: Lori Chase is a 71 y.o. female.    Hyperlipidemia  This is a chronic problem. Episode onset: patient here to follow up. Patietn does not need refills. Patient would like shot ileft knee. The problem is controlled. Exacerbating diseases include diabetes. She has no history of chronic renal disease, hypothyroidism, liver disease, obesity or nephrotic syndrome. There are no known factors aggravating her hyperlipidemia. Pertinent negatives include no chest pain, focal sensory loss, focal weakness, leg pain, myalgias or shortness of breath. The current treatment provides significant improvement of lipids. There are no compliance problems.  There are no known risk factors for coronary artery disease.   Hypertension  Pertinent negatives include no chest pain or shortness of breath. There is no history of chronic renal disease.       Review of Systems   Respiratory: Negative for shortness of breath.    Cardiovascular: Negative for chest pain.   Musculoskeletal: Negative for myalgias.   Neurological: Negative for focal weakness.       Objective:   Physical Exam      PHYSICAL EXAMINATION:   VITAL SIGNS: are as recorded.  GENERAL:  The patient appears well nourished and well-developed, and with normal affect.  No acute respiratory distress. Alert and oriented times 3. No skin rashes.     HEENT:  TMs normal bilaterally.  Canals and ears normal. Pharynx is clear. Extraocular eye motions intact and pain free.   Pupils reactive and equally round.  Sclerae and conjunctivae clear, normocephalic and atraumatic.      RESPIRATORY:  Clear and equal breath sounds with no acute respiratory distress.       HEART: Regular rhythm without murmur, rub or gallop.     ABDOMEN:  soft, nontender. No masses, guarding or rebound. Normoactive bowel sounds.     NECK: No masses or adenopathy palpable.  No bruits heard. No asymmetry visible.     COMPLETE EXTREMITIES: No edema, all four extremities with posterior tibial  and radial pulses strong and palpable.     EXTREMITIES:  no edema in any extremity. No cyanosis or clubbing.    LOW BACK: No tenderness to palpation of inferior lumbar spine or either sacroiliac joint area.     OA R knee.  Assessment:      Encounter Diagnoses   Name Primary?   ??? Type II or unspecified type diabetes mellitus without mention of complication, not stated as uncontrolled Yes   ??? Hypertension    ??? GERD (gastroesophageal reflux disease)    ??? Osteopenia    ??? Breast cancer    ??? Symptomatic menopausal or female climacteric states    ??? Other malaise and fatigue    ??? Menopause    ??? OA (osteoarthritis)    ??? Need for pneumococcal vaccine      Current Outpatient Prescriptions on File Prior to Visit   Medication Sig Dispense Refill   ??? amoxicillin (AMOXIL) 875 MG tablet        ??? ibuprofen (ADVIL;MOTRIN) 600 MG tablet        ??? albuterol (VENTOLIN HFA) 108 (90 BASE) MCG/ACT inhaler Inhale 2 puffs into the lungs 4 times daily.  1 Inhaler  3   ??? alendronate (FOSAMAX) 70 MG tablet TAKE 1 TABLET BY MOUTH ONCE A WEEK  12 tablet  1   ??? glimepiride (AMARYL) 4 MG tablet 2 qd  180 tablet  1   ??? valsartan (DIOVAN) 80 MG tablet Take  1 tablet by mouth daily.  90 tablet  1   ??? alendronate (FOSAMAX) 70 MG tablet Take 1 tablet by mouth every 7 days.  12 tablet  1   ??? atorvastatin (LIPITOR) 10 MG tablet Take 1 tablet by mouth daily.  90 tablet  1   ??? esomeprazole (NEXIUM) 40 MG capsule Take 1 capsule by mouth every morning (before breakfast).  90 capsule  1   ??? amLODIPine (NORVASC) 10 MG tablet Take 1 tablet by mouth daily.  90 tablet  1   ??? metformin (GLUCOPHAGE-XR) 500 MG XR tablet Take 1 tablet by mouth 4 times daily.  360 tablet  1   ??? sitaGLIPtan (JANUVIA) 100 MG tablet Take 1 tablet by mouth daily.  90 tablet  1   ??? aspirin (ASPIRIN CHILDRENS) 81 MG chewable tablet Take 1 tablet by mouth daily.  90 tablet  3   ??? Glucose Blood (BAYER BREEZE 2 TEST VI) by In Vitro route.         ??? BAYER MICROLET LANCETS MISC by Does not apply  route.  100 each  1   ??? Blood Glucose Monitoring Suppl (BAYER BREEZE 2 SYSTEM) W/DEVICE KIT by Does not apply route.  3 kit  0     No current facility-administered medications on file prior to visit.            Plan:      Orders Placed This Encounter   Medications   ??? cefdinir (OMNICEF) 300 MG capsule     Sig: 2 qd     Dispense:  20 capsule     Refill:  0     Orders Placed This Encounter   Procedures   ??? DEXA Bone Density Axial Skeleton   ??? Pneumococcal polysaccharide vaccine 23-valent greater than or equal to 2yo subcutaneous/IM     [id number]   ??? CBC Auto Differential   ??? Comprehensive metabolic panel   ??? Lipid panel     Order Specific Question:  Is Patient Fasting?/# of Hours     Answer:  yes   ??? Hemoglobin A1c   ??? Magnesium   ??? PR DRAIN/INJECT LARGE JOINT/BURSA   ??? PR TRIAMCINOLONE ACETONIDE INJ     80 mg     Health Maintenance Due   Topic Date Due   ??? Dexa Scan Women  11/10/2003   ??? Colon Cancer Screening Colonoscopy  02/29/2012      colonoscopy next.    Symptoms and complaints should be improving over 48 hours and entirely resolved in two weeks, if not, patient should call the office for a follow-up appointment.    Lose wt.    Risks were explained.  Consent was obtained.  Joint was injected with Kenalog & Lidocaine.  Pt tolerated procedure well.  There were no complications.R knee.    See pv.    PT & ice

## 2011-12-10 LAB — COMPREHENSIVE METABOLIC PANEL
ALT: 64 U/L — ABNORMAL HIGH (ref 0–63)
AST: 39 U/L — ABNORMAL HIGH (ref 0–35)
Albumin: 4.5 g/dL (ref 3.5–5.2)
Alkaline Phosphatase: 64 U/L (ref 40–135)
Anion Gap: 13 mEq/L (ref 7–13)
BUN: 19 mg/dL (ref 10–26)
CO2: 26 mEq/L (ref 22–32)
Calcium: 10.1 mg/dL (ref 8.5–10.2)
Chloride: 103 mEq/L (ref 99–111)
Creatinine: 0.78 mg/dL (ref 0.50–1.10)
GFR African American: >60 (ref >60)
GFR Non-African American: >60 (ref >60)
Globulin: 3.3 g/dL (ref 2.3–3.5)
Glucose: 103 mg/dL (ref 60–115)
Potassium: 5.5 mEq/L (ref 3.5–5.5)
Sodium: 142 mEq/L (ref 135–146)
Total Bilirubin: 0.3 mg/dL (ref 0.2–1.1)
Total Protein: 7.8 g/dL (ref 6.4–8.1)

## 2011-12-10 LAB — HEMOGLOBIN A1C: Hemoglobin A1C: 8.4 % — ABNORMAL HIGH (ref 4.2–5.9)

## 2011-12-10 LAB — LIPID PANEL
Cholesterol, Total: 130 mg/dL (ref 0–199)
HDL: 44 mg/dL (ref 40–59)
LDL Calculated: 48 mg/dL (ref 0–129)
Triglycerides: 190 mg/dL — ABNORMAL HIGH (ref 0–149)

## 2011-12-10 LAB — MAGNESIUM: Magnesium: 2.3 mg/dL (ref 1.4–2.6)

## 2012-01-07 MED ORDER — MELOXICAM 15 MG PO TABS
15 MG | ORAL_TABLET | Freq: Every day | ORAL | Status: DC
Start: 2012-01-07 — End: 2013-04-09

## 2012-01-07 NOTE — Progress Notes (Signed)
Subjective:      Patient ID: Lori Chase is a 71 y.o. female.    Knee Pain   The incident occurred 3 to 5 days ago (Pt is here today for right knee & right ankle pain-  she stepped off a curb, happened Monday.). The injury mechanism was a direct blow. The pain is present in the right knee and right ankle. The quality of the pain is described as aching. The pain is at a severity of 7/10. The pain is severe. The pain has been constant since onset. Associated symptoms include an inability to bear weight and a loss of motion. Pertinent negatives include no loss of sensation, muscle weakness, numbness or tingling. Associated symptoms comments: KEEPS HER UP AT NIGHT.Marland Kitchen The symptoms are aggravated by movement and weight bearing. Treatments tried: Advil, ibuprofen. The treatment provided no relief.       Review of Systems   Neurological: Negative for tingling and numbness.       Objective:   Physical Exam   Nursing note and vitals reviewed.  Constitutional: She is oriented to person, place, and time. She appears well-developed and well-nourished.   HENT:   Head: Normocephalic and atraumatic.   Right Ear: External ear normal.   Left Ear: External ear normal.   Eyes: Conjunctivae and EOM are normal. Pupils are equal, round, and reactive to light.   Neck: Normal range of motion. Neck supple.   Musculoskeletal:        Right knee: She exhibits decreased range of motion. She exhibits no erythema. Tenderness found. Patellar tendon tenderness noted.   Neurological: She is alert and oriented to person, place, and time.   Skin: Skin is warm and dry.   Psychiatric: She has a normal mood and affect. Her behavior is normal. Judgment and thought content normal.       Assessment:      Encounter Diagnoses   Name Primary?   ??? Right knee pain Yes   ??? OA (osteoarthritis)    ??? Transient arthropathy of knee      Current Outpatient Prescriptions on File Prior to Visit   Medication Sig Dispense Refill   ??? meloxicam (MOBIC) 15 MG tablet Take 1  tablet by mouth daily for 30 days.  30 tablet  3   ??? amoxicillin (AMOXIL) 875 MG tablet        ??? ibuprofen (ADVIL;MOTRIN) 600 MG tablet        ??? albuterol (VENTOLIN HFA) 108 (90 BASE) MCG/ACT inhaler Inhale 2 puffs into the lungs 4 times daily.  1 Inhaler  3   ??? alendronate (FOSAMAX) 70 MG tablet TAKE 1 TABLET BY MOUTH ONCE A WEEK  12 tablet  1   ??? glimepiride (AMARYL) 4 MG tablet 2 qd  180 tablet  1   ??? valsartan (DIOVAN) 80 MG tablet Take 1 tablet by mouth daily.  90 tablet  1   ??? alendronate (FOSAMAX) 70 MG tablet Take 1 tablet by mouth every 7 days.  12 tablet  1   ??? atorvastatin (LIPITOR) 10 MG tablet Take 1 tablet by mouth daily.  90 tablet  1   ??? esomeprazole (NEXIUM) 40 MG capsule Take 1 capsule by mouth every morning (before breakfast).  90 capsule  1   ??? amLODIPine (NORVASC) 10 MG tablet Take 1 tablet by mouth daily.  90 tablet  1   ??? metformin (GLUCOPHAGE-XR) 500 MG XR tablet Take 1 tablet by mouth 4 times daily.  360 tablet  1   ??? sitaGLIPtan (JANUVIA) 100 MG tablet Take 1 tablet by mouth daily.  90 tablet  1   ??? aspirin (ASPIRIN CHILDRENS) 81 MG chewable tablet Take 1 tablet by mouth daily.  90 tablet  3   ??? Glucose Blood (BAYER BREEZE 2 TEST VI) by In Vitro route.         ??? BAYER MICROLET LANCETS MISC by Does not apply route.  100 each  1   ??? Blood Glucose Monitoring Suppl (BAYER BREEZE 2 SYSTEM) W/DEVICE KIT by Does not apply route.  3 kit  0     No current facility-administered medications on file prior to visit.            Plan:      Orders Placed This Encounter   Medications   ??? meloxicam (MOBIC) 15 MG tablet     Sig: Take 1 tablet by mouth daily for 30 days.     Dispense:  30 tablet     Refill:  3     Orders Placed This Encounter   Procedures   ??? XR Knee Right Limited     No health maintenance topics applied.

## 2012-02-17 NOTE — Telephone Encounter (Signed)
rx

## 2012-02-19 MED ORDER — VALSARTAN 80 MG PO TABS
80 MG | ORAL_TABLET | Freq: Every day | ORAL | Status: DC
Start: 2012-02-19 — End: 2012-04-24

## 2012-02-23 MED ORDER — JANUVIA 100 MG PO TABS
100 MG | ORAL_TABLET | ORAL | Status: DC
Start: 2012-02-23 — End: 2012-05-22

## 2012-02-23 MED ORDER — METFORMIN HCL ER 500 MG PO TB24
500 MG | ORAL_TABLET | ORAL | Status: DC
Start: 2012-02-23 — End: 2012-05-22

## 2012-02-23 MED ORDER — GLIMEPIRIDE 4 MG PO TABS
4 MG | ORAL_TABLET | ORAL | Status: DC
Start: 2012-02-23 — End: 2012-05-22

## 2012-02-23 MED ORDER — NEXIUM 40 MG PO CPDR
40 MG | ORAL_CAPSULE | ORAL | Status: DC
Start: 2012-02-23 — End: 2012-05-22

## 2012-02-23 MED ORDER — ATORVASTATIN CALCIUM 10 MG PO TABS
10 MG | ORAL_TABLET | ORAL | Status: DC
Start: 2012-02-23 — End: 2012-05-22

## 2012-02-23 MED ORDER — AMLODIPINE BESYLATE 10 MG PO TABS
10 MG | ORAL_TABLET | ORAL | Status: DC
Start: 2012-02-23 — End: 2012-05-22

## 2012-02-23 NOTE — Telephone Encounter (Signed)
Last ov 01/07/12

## 2012-03-06 MED ORDER — ALENDRONATE SODIUM 70 MG PO TABS
70 MG | ORAL_TABLET | ORAL | Status: DC
Start: 2012-03-06 — End: 2012-05-22

## 2012-03-06 NOTE — Telephone Encounter (Signed)
Done.

## 2012-03-16 MED ORDER — BAYER BREEZE 2 SYSTEM W/DEVICE KIT
PACK | Status: DC
Start: 2012-03-16 — End: 2015-03-20

## 2012-03-16 MED ORDER — BAYER MICROLET LANCETS MISC
Status: DC
Start: 2012-03-16 — End: 2013-04-09

## 2012-03-16 MED ORDER — DICLOFENAC SODIUM 75 MG PO TBEC
75 MG | ORAL_TABLET | Freq: Two times a day (BID) | ORAL | Status: DC
Start: 2012-03-16 — End: 2012-05-22

## 2012-03-16 NOTE — Progress Notes (Signed)
Subjective:      Patient ID: Lori Chase is a 71 y.o. female.    Knee Pain   Incident onset: In August. Incident location: In [place]. The injury mechanism was a fall. The pain is present in the right knee. The quality of the pain is described as aching. The pain is at a severity of 8/10. The pain is moderate (pain is worse at night.). The pain has been constant since onset. Pertinent negatives include no muscle weakness, numbness or tingling. The symptoms are aggravated by movement. She has tried NSAIDs for the symptoms. The treatment provided no relief.       Review of Systems   Neurological: Negative for tingling and numbness.     FEMALE ROS:    REVIEW OF SYSTEMS:  The patient reports no problems with hearing or headaches.  She reports no problems with vision. She is advised to have a yearly eye examination. She has no chest pains or pressures, or shortness of breath.  She has no indigestion, heartburn, nausea, or vomiting.   She has no constipation, diarrhea, or  black, bloody, mucousy, or tarry stool.  She has no trouble passing urine or losing urine.  Libido seems to be normal.  She reports no musculoskeletal aches or pains.  No paresthesias or loss of strength.           Chief Complaint   Patient presents with   ??? Knee Pain     Right     Past Medical History   Diagnosis Date   ??? Type II or unspecified type diabetes mellitus without mention of complication, not stated as uncontrolled    ??? Anxiety    ??? Hypertension    ??? Cancer      breast   ??? 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 Right knee pain 01/07/2012     Past Surgical History   Procedure Laterality Date   ??? Cholecystectomy     ??? Tonsillectomy  age 39   ??? Breast lumpectomy  2007     Allergies   Allergen Reactions   ??? Morphine      History     Social History   ??? Marital Status: Married     Spouse Name: N/A     Number of Children: N/A   ??? Years of Education: N/A     Occupational History   ??? Not on file.     Social History Main Topics   ???  Smoking status: Never Smoker    ??? Smokeless tobacco: Never Used   ??? Alcohol Use: Yes      occ wine   ??? Drug Use: No   ??? Sexually Active: Not on file     Other Topics Concern   ??? Not on file     Social History Narrative   ??? No narrative on file     Family History   Problem Relation Age of Onset   ??? High Blood Pressure Mother    ??? High Blood Pressure Father      Current Outpatient Prescriptions on File Prior to Visit   Medication Sig Dispense Refill   ??? Blood Glucose Monitoring Suppl (BAYER BREEZE 2 SYSTEM) W/DEVICE KIT by Does not apply route.  3 kit  0   ??? BAYER MICROLET LANCETS MISC by Does not apply route.  100 each  1   ??? diclofenac (VOLTAREN) 75 MG EC tablet Take 1 tablet by mouth 2 times daily (with meals).  60 tablet  1   ??? alendronate (FOSAMAX) 70 MG tablet TAKE 1 TABLET BY MOUTH ONCE A WEEK  12 tablet  1   ??? NEXIUM 40 MG capsule TAKE 1 CAPSULE BY MOUTH EVERY MORNING (BEFORE BREAKFAST).  90 capsule  1   ??? JANUVIA 100 MG tablet TAKE 1 TABLET BY MOUTH EVERY DAY.  90 tablet  1   ??? metFORMIN ER (GLUCOPHAGE-XR) 500 MG XR tablet TAKE 1 TABLET BY MOUTH 4 TIMES DAILY.  360 tablet  1   ??? glimepiride (AMARYL) 4 MG tablet TAKE 2 TABLETS BY MOUTH EVERY DAY  180 tablet  1   ??? atorvastatin (LIPITOR) 10 MG tablet TAKE 1 TABLET BY MOUTH EVERY DAY.  90 tablet  1   ??? amLODIPine (NORVASC) 10 MG tablet TAKE 1 TABLET BY MOUTH EVERY DAY.  90 tablet  1   ??? valsartan (DIOVAN) 80 MG tablet Take 1 tablet by mouth daily.  90 tablet  1   ??? ibuprofen (ADVIL;MOTRIN) 600 MG tablet        ??? aspirin (ASPIRIN CHILDRENS) 81 MG chewable tablet Take 1 tablet by mouth daily.  90 tablet  3   ??? Glucose Blood (BAYER BREEZE 2 TEST VI) by In Vitro route.         ??? meloxicam (MOBIC) 15 MG tablet Take 1 tablet by mouth daily for 30 days.  30 tablet  3     No current facility-administered medications on file prior to visit.     BP 130/60   Pulse 80   Resp 16   Ht 5\' 7"  (1.702 m)   Wt 161 lb 12.8 oz (73.392 kg)   BMI 25.34 kg/m2      Objective:   Physical  Exam      PHYSICAL EXAMINATION:   VITAL SIGNS: are as recorded.  GENERAL:  The patient appears well nourished and well-developed, and with normal affect.  No acute respiratory distress. Alert and oriented times 3. No skin rashes.     HEENT:  TMs normal bilaterally.  Canals and ears normal. Pharynx is clear. Extraocular eye motions intact and pain free.   Pupils reactive and equally round.  Sclerae and conjunctivae clear, normocephalic and atraumatic.      RESPIRATORY:  Clear and equal breath sounds with no acute respiratory distress.       HEART: Regular rhythm without murmur, rub or gallop.     ABDOMEN:  soft, nontender. No masses, guarding or rebound. Normoactive bowel sounds.     NECK: No masses or adenopathy palpable.  No bruits heard. No asymmetry visible.     COMPLETE EXTREMITIES: No edema, all four extremities with posterior tibial and radial pulses strong and palpable.     EXTREMITIES:  no edema in any extremity. No cyanosis or clubbing.Oa B knees      LOW BACK: No tenderness to palpation of inferior lumbar spine or either sacroiliac joint area.       Assessment:       Encounter Diagnoses   Name Primary?   ??? Type II or unspecified type diabetes mellitus without mention of complication, not stated as uncontrolled Yes   ??? Need for influenza vaccination    ??? Hypertension    ??? Cancer    ??? GERD (gastroesophageal reflux disease)    ??? Osteopenia    ??? Other malaise and fatigue    ??? Menopause    ??? OA (osteoarthritis) of knee        No  health maintenance topics applied.     Plan:      Orders Placed This Encounter   Medications   ??? Blood Glucose Monitoring Suppl (BAYER BREEZE 2 SYSTEM) W/DEVICE KIT     Sig: by Does not apply route.     Dispense:  3 kit     Refill:  0   ??? BAYER MICROLET LANCETS MISC     Sig: by Does not apply route.     Dispense:  100 each     Refill:  1   ??? diclofenac (VOLTAREN) 75 MG EC tablet     Sig: Take 1 tablet by mouth 2 times daily (with meals).     Dispense:  60 tablet     Refill:  1      Orders Placed This Encounter   Procedures   ??? DEXA Bone Density Axial Skeleton   ??? Flu Vaccine greater than or = 73yo IM   ??? Hemoglobin A1c   ??? Microalbumin / Creatinine Urine Ratio   ??? Lipid panel     Order Specific Question:  Is Patient Fasting?/# of Hours     Answer:  yes   ??? Comprehensive metabolic panel   ??? CBC Auto Differential   ??? Magnesium   ??? HM DIABETES EYE EXAM   ??? HM DIABETES FOOT EXAM   ??? PR TRIAMCINOLONE ACETONIDE INJ     80 mg   ??? PR DRAIN/INJECT LARGE JOINT/BURSA     Risks were explained.  Consent was obtained.  Joint was injected with Kenalog & Lidocaine.  Pt tolerated procedure well.  There were no complications. r knee    Symptoms and complaints should be improving over 48 hours and entirely resolved in two weeks, if not, patient should call the office for a follow-up appointment.    Colonoscopy next    See pv.    PT & ice

## 2012-03-17 LAB — LIPID PANEL
Cholesterol, Total: 126 mg/dL (ref 0–199)
HDL: 37 mg/dL — ABNORMAL LOW (ref 40–59)
LDL Calculated: 42 mg/dL (ref 0–129)
Triglycerides: 234 mg/dL — ABNORMAL HIGH (ref 0–149)

## 2012-03-17 LAB — CBC WITH DIFFERENTIAL
Basophils %: 0.5 %
Basophils Absolute: 0.1 10*3/uL (ref 0.0–0.2)
Eosinophils %: 2.2 %
Eosinophils Absolute: 0.2 10*3/uL (ref 0.0–0.7)
Hematocrit: 38 % (ref 37.0–47.0)
Hemoglobin: 12.5 g/dL (ref 12.0–16.0)
Lymphocytes %: 20.8 %
Lymphocytes Absolute: 2.3 10*3/uL (ref 1.0–4.8)
MCH: 28 pg (ref 27.0–31.3)
MCHC: 32.8 % — ABNORMAL LOW (ref 33.0–37.0)
MCV: 85.3 fL (ref 82.0–100.0)
MPV: 11.4 fL — ABNORMAL HIGH (ref 7.4–10.4)
Monocytes %: 6.5 %
Monocytes Absolute: 0.7 10*3/uL (ref 0.2–0.8)
Neutrophils %: 70 %
Neutrophils Absolute: 7.7 10*3/uL — ABNORMAL HIGH (ref 1.4–6.5)
Platelets: 247 10*3/uL (ref 130–400)
RBC: 4.46 M/uL (ref 4.20–5.40)
RDW: 15.3 % — ABNORMAL HIGH (ref 11.5–14.5)
WBC: 11 10*3/uL — ABNORMAL HIGH (ref 4.8–10.8)

## 2012-03-17 LAB — COMPREHENSIVE METABOLIC PANEL
ALT: 77 U/L — ABNORMAL HIGH (ref 0–63)
AST: 47 U/L — ABNORMAL HIGH (ref 0–35)
Albumin: 4.6 g/dL (ref 3.5–5.2)
Alkaline Phosphatase: 65 U/L (ref 40–135)
Anion Gap: 10 mEq/L (ref 7–13)
BUN: 19 mg/dL (ref 10–26)
CO2: 28 mEq/L (ref 22–32)
Calcium: 9.8 mg/dL (ref 8.5–10.2)
Chloride: 101 mEq/L (ref 99–111)
Creatinine: 0.78 mg/dL (ref 0.50–1.10)
GFR African American: >60 (ref >60)
GFR Non-African American: >60 (ref >60)
Globulin: 2.9 g/dL (ref 2.3–3.5)
Glucose: 204 mg/dL — ABNORMAL HIGH (ref 60–115)
Potassium: 5.2 mEq/L (ref 3.5–5.5)
Sodium: 139 mEq/L (ref 135–146)
Total Bilirubin: 0.2 mg/dL (ref 0.2–1.1)
Total Protein: 7.5 g/dL (ref 6.4–8.1)

## 2012-03-17 LAB — HEMOGLOBIN A1C: Hemoglobin A1C: 8.7 % — ABNORMAL HIGH (ref 4.2–5.9)

## 2012-03-17 LAB — MICROALBUMIN / CREATININE URINE RATIO
Creatinine, Ur: 123.6 mg/dL
Microalbumin Creatinine Ratio: 4.9 mg/G (ref 0.0–30.0)
Microalbumin, Random Urine: 0.6 mg/dL

## 2012-03-17 LAB — MAGNESIUM: Magnesium: 1.9 mg/dL (ref 1.4–2.6)

## 2012-04-24 NOTE — Telephone Encounter (Signed)
rx

## 2012-04-26 MED ORDER — VALSARTAN 80 MG PO TABS
80 MG | ORAL_TABLET | Freq: Every day | ORAL | Status: DC
Start: 2012-04-26 — End: 2012-05-22

## 2012-04-26 NOTE — Progress Notes (Addendum)
Subjective:      Patient ID: Lori Chase is a 71 y.o. female.    Diabetes  She presents for her follow-up diabetic visit. She has type 2 diabetes mellitus. Her disease course has been stable. There are no diabetic associated symptoms.     Treatment Adherence:   Medication compliance:  compliant all of the time  Diet compliance:  compliant most of the time  Weight trend: stable  Current exercise: aerobics 3 time(s) per week      Diabetes Mellitus Type 2: Current symptoms/problems include none.    Home blood sugar records:  fasting range: 147  Any episodes of hypoglycemia? no  Eye exam current (within one year): yes  Tobacco history: She  reports that she has never smoked. She has never used smokeless tobacco.   Daily Aspirin? No:  Known diabetic complications: none    Hypertension:  Home blood pressure monitoring: No.  She is adherent to a low sodium diet. Patient denies chest pain.  Antihypertensive medication side effects: no medication side effects noted.  Use of agents associated with hypertension: none.     Hyperlipidemia:  No new myalgias or GI upset on atorvastatin (Lipitor).       Lab Results   Component Value Date    LABA1C 8.7* 03/16/2012    LABA1C 8.4* 12/09/2011    LABA1C 9.4* 07/26/2011     Lab Results   Component Value Date    LABMICR 0.6 03/16/2012    CREATININE 0.67 04/26/2012     Lab Results   Component Value Date    ALT 77* 03/16/2012    AST 47* 03/16/2012     Lab Results   Component Value Date    CHOL 126 03/16/2012    TRIG 234* 03/16/2012    HDL 37* 03/16/2012    LDLCALC 42 03/16/2012            Review of Systems     FEMALE ROS:    REVIEW OF SYSTEMS:  The patient reports no problems with hearing or headaches.  She reports no problems with vision. She is advised to have a yearly eye examination. She has no chest pains or pressures, or shortness of breath.  She has no indigestion, heartburn, nausea, or vomiting.   She has no constipation, diarrhea, or  black, bloody, mucousy, or tarry stool.  She  has no trouble passing urine or losing urine.  Libido seems to be normal.  She reports no musculoskeletal aches or pains.  No paresthesias or loss of strength.         Chief Complaint   Patient presents with   ??? Diabetes     2 month follow up. Pt states no issues or concerns at this time.     Past Medical History   Diagnosis Date   ??? Type II or unspecified type diabetes mellitus without mention of complication, not stated as uncontrolled    ??? Anxiety    ??? Hypertension    ??? Cancer      breast   ??? 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 Right knee pain 01/07/2012     Past Surgical History   Procedure Laterality Date   ??? Cholecystectomy     ??? Tonsillectomy  age 46   ??? Breast lumpectomy  2007     Allergies   Allergen Reactions   ??? Morphine    ??? Oxycontin (Oxycodone)      History     Social History   ???  Marital Status: Married     Spouse Name: N/A     Number of Children: N/A   ??? Years of Education: N/A     Occupational History   ??? Not on file.     Social History Main Topics   ??? Smoking status: Never Smoker    ??? Smokeless tobacco: Never Used   ??? Alcohol Use: Yes      Comment: occ wine   ??? Drug Use: No   ??? Sexually Active: Not on file     Other Topics Concern   ??? Not on file     Social History Narrative   ??? No narrative on file     Family History   Problem Relation Age of Onset   ??? High Blood Pressure Mother    ??? High Blood Pressure Father      Current Outpatient Prescriptions on File Prior to Visit   Medication Sig Dispense Refill   ??? valsartan (DIOVAN) 80 MG tablet Take 1 tablet by mouth daily.  90 tablet  1   ??? Blood Glucose Monitoring Suppl (BAYER BREEZE 2 SYSTEM) W/DEVICE KIT by Does not apply route.  3 kit  0   ??? BAYER MICROLET LANCETS MISC by Does not apply route.  100 each  1   ??? diclofenac (VOLTAREN) 75 MG EC tablet Take 1 tablet by mouth 2 times daily (with meals).  60 tablet  1   ??? alendronate (FOSAMAX) 70 MG tablet TAKE 1 TABLET BY MOUTH ONCE A WEEK  12 tablet  1   ??? NEXIUM 40 MG capsule TAKE 1 CAPSULE BY  MOUTH EVERY MORNING (BEFORE BREAKFAST).  90 capsule  1   ??? JANUVIA 100 MG tablet TAKE 1 TABLET BY MOUTH EVERY DAY.  90 tablet  1   ??? metFORMIN ER (GLUCOPHAGE-XR) 500 MG XR tablet TAKE 1 TABLET BY MOUTH 4 TIMES DAILY.  360 tablet  1   ??? glimepiride (AMARYL) 4 MG tablet TAKE 2 TABLETS BY MOUTH EVERY DAY  180 tablet  1   ??? atorvastatin (LIPITOR) 10 MG tablet TAKE 1 TABLET BY MOUTH EVERY DAY.  90 tablet  1   ??? amLODIPine (NORVASC) 10 MG tablet TAKE 1 TABLET BY MOUTH EVERY DAY.  90 tablet  1   ??? meloxicam (MOBIC) 15 MG tablet Take 1 tablet by mouth daily for 30 days.  30 tablet  3   ??? ibuprofen (ADVIL;MOTRIN) 600 MG tablet        ??? aspirin (ASPIRIN CHILDRENS) 81 MG chewable tablet Take 1 tablet by mouth daily.  90 tablet  3   ??? Glucose Blood (BAYER BREEZE 2 TEST VI) by In Vitro route.           No current facility-administered medications on file prior to visit.     BP 151/85   Pulse 88   Temp(Src) 98.5 ??F (36.9 ??C) (Oral)   Ht 5\' 7"  (1.702 m)   Wt 161 lb (73.029 kg)   BMI 25.21 kg/m2      Objective:   Physical Exam      PHYSICAL EXAMINATION:   VITAL SIGNS: are as recorded.  GENERAL:  The patient appears well nourished and well-developed, and with normal affect.  No acute respiratory distress. Alert and oriented times 3. No skin rashes.     HEENT:  TMs normal bilaterally.  Canals and ears normal. Pharynx is clear. Extraocular eye motions intact and pain free.   Pupils reactive and equally round.  Sclerae  and conjunctivae clear, normocephalic and atraumatic.      RESPIRATORY:  Clear and equal breath sounds with no acute respiratory distress.       HEART: Regular rhythm without murmur, rub or gallop.     ABDOMEN:  soft, diffuse tenderness noted. No masses, guarding or rebound. Normoactive bowel sounds.     NECK: No masses or adenopathy palpable.  No bruits heard. No asymmetry visible.     COMPLETE EXTREMITIES: No edema, all four extremities with posterior tibial and radial pulses strong and palpable.     EXTREMITIES:  no  edema in any extremity. No cyanosis or clubbing.    LOW BACK: No tenderness to palpation of inferior lumbar spine or either sacroiliac joint area.       Assessment:      Encounter Diagnoses   Name Primary?   ??? Generalized abdominal pain Yes   ??? Type II or unspecified type diabetes mellitus without mention of complication, not stated as uncontrolled    ??? Hypertension    ??? GERD (gastroesophageal reflux disease)    ??? OA (osteoarthritis)    ??? Symptomatic menopausal or female climacteric states    ??? Leg cramping      No health maintenance topics applied.         Plan:      Orders Placed This Encounter   Procedures   ??? CT Abdomen W Contrast   ??? CT Pelvis W IV Contrast Only   ??? Basic Metabolic Panel     No orders of the defined types were placed in this encounter.     Return in about 2 weeks (around 05/10/2012).         [employer] QD    Symptoms and complaints should be improving over 48 hours and entirely resolved in two weeks, if not, patient should call the office for a follow-up appointment.    See pv    Concerned about pancreas     Colonoscopy 2014.

## 2012-04-27 LAB — BASIC METABOLIC PANEL
Anion Gap: 12 mEq/L (ref 7–13)
BUN: 15 mg/dL (ref 8–23)
CO2: 26 mEq/L (ref 22–29)
Calcium: 10.5 mg/dL — ABNORMAL HIGH (ref 8.6–10.2)
Chloride: 98 mEq/L (ref 98–107)
Creatinine: 0.67 mg/dL (ref 0.50–0.90)
GFR African American: >60 (ref >60)
GFR Non-African American: >60 (ref >60)
Glucose: 179 mg/dL — ABNORMAL HIGH (ref 74–109)
Potassium: 5.2 mEq/L — ABNORMAL HIGH (ref 3.5–5.1)
Sodium: 136 mEq/L (ref 136–145)

## 2012-04-27 NOTE — Telephone Encounter (Signed)
di[facility] needs pa through ins. Form faxed.

## 2012-05-01 NOTE — Telephone Encounter (Signed)
approved

## 2012-05-10 NOTE — Progress Notes (Signed)
Subjective:      Patient ID: Lori Chase is a 71 y.o. female.    HPI Comments: Pt would like to discuss her recent cholesterol levels with you.    Diabetes  She presents for her follow-up diabetic visit. She has type 2 diabetes mellitus. Her disease course has been stable. There are no hypoglycemic associated symptoms. There are no diabetic associated symptoms. There are no hypoglycemic complications. Symptoms are stable.       Review of Systems     Chief Complaint   Patient presents with   ??? Diabetes     Follow up to review recent lab work. She would like to discuss her cholesterol numbers.     Past Medical History   Diagnosis Date   ??? Type II or unspecified type diabetes mellitus without mention of complication, not stated as uncontrolled    ??? Anxiety    ??? Hypertension    ??? Cancer      breast   ??? 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 Right knee pain 01/07/2012     Past Surgical History   Procedure Laterality Date   ??? Cholecystectomy     ??? Tonsillectomy  age 54   ??? Breast lumpectomy  2007     Allergies   Allergen Reactions   ??? Morphine    ??? Oxycontin (Oxycodone)      History     Social History   ??? Marital Status: Married     Spouse Name: N/A     Number of Children: N/A   ??? Years of Education: N/A     Occupational History   ??? Not on file.     Social History Main Topics   ??? Smoking status: Never Smoker    ??? Smokeless tobacco: Never Used   ??? Alcohol Use: Yes      Comment: occ wine   ??? Drug Use: No   ??? Sexually Active: Not on file     Other Topics Concern   ??? Not on file     Social History Narrative   ??? No narrative on file     Family History   Problem Relation Age of Onset   ??? High Blood Pressure Mother    ??? High Blood Pressure Father      Current Outpatient Prescriptions on File Prior to Visit   Medication Sig Dispense Refill   ??? valsartan (DIOVAN) 80 MG tablet Take 1 tablet by mouth daily.  90 tablet  1   ??? Blood Glucose Monitoring Suppl (BAYER BREEZE 2 SYSTEM) W/DEVICE KIT by Does not apply route.  3 kit   0   ??? BAYER MICROLET LANCETS MISC by Does not apply route.  100 each  1   ??? diclofenac (VOLTAREN) 75 MG EC tablet Take 1 tablet by mouth 2 times daily (with meals).  60 tablet  1   ??? alendronate (FOSAMAX) 70 MG tablet TAKE 1 TABLET BY MOUTH ONCE A WEEK  12 tablet  1   ??? NEXIUM 40 MG capsule TAKE 1 CAPSULE BY MOUTH EVERY MORNING (BEFORE BREAKFAST).  90 capsule  1   ??? JANUVIA 100 MG tablet TAKE 1 TABLET BY MOUTH EVERY DAY.  90 tablet  1   ??? metFORMIN ER (GLUCOPHAGE-XR) 500 MG XR tablet TAKE 1 TABLET BY MOUTH 4 TIMES DAILY.  360 tablet  1   ??? glimepiride (AMARYL) 4 MG tablet TAKE 2 TABLETS BY MOUTH EVERY DAY  180 tablet  1   ???  atorvastatin (LIPITOR) 10 MG tablet TAKE 1 TABLET BY MOUTH EVERY DAY.  90 tablet  1   ??? amLODIPine (NORVASC) 10 MG tablet TAKE 1 TABLET BY MOUTH EVERY DAY.  90 tablet  1   ??? meloxicam (MOBIC) 15 MG tablet Take 1 tablet by mouth daily for 30 days.  30 tablet  3   ??? ibuprofen (ADVIL;MOTRIN) 600 MG tablet        ??? aspirin (ASPIRIN CHILDRENS) 81 MG chewable tablet Take 1 tablet by mouth daily.  90 tablet  3   ??? Glucose Blood (BAYER BREEZE 2 TEST VI) by In Vitro route.           No current facility-administered medications on file prior to visit.     BP 138/82   Pulse 88   Temp(Src) 97.5 ??F (36.4 ??C) (Oral)   Ht 5\' 7"  (1.702 m)   Wt 160 lb (72.576 kg)   BMI 25.05 kg/m2      Objective:   Physical Exam      PHYSICAL EXAMINATION:   VITAL SIGNS: are as recorded.  GENERAL:  The patient appears well nourished and well-developed, and with normal affect.  No acute respiratory distress. Alert and oriented times 3. No skin rashes.     HEENT:  TMs normal bilaterally.  Canals and ears normal. Pharynx is clear. Extraocular eye motions intact and pain free.   Pupils reactive and equally round.  Sclerae and conjunctivae clear, normocephalic and atraumatic.      RESPIRATORY:  Clear and equal breath sounds with no acute respiratory distress.       HEART: Regular rhythm without murmur, rub or gallop.     ABDOMEN:   soft, Diffuse tenderness. No masses, guarding or rebound. Normoactive bowel sounds.     NECK: No masses or adenopathy palpable.  No bruits heard. No asymmetry visible.     COMPLETE EXTREMITIES: No edema, all four extremities with posterior tibial and radial pulses strong and palpable.     EXTREMITIES:  no edema in any extremity. No cyanosis or clubbing.    LOW BACK: No tenderness to palpation of inferior lumbar spine or either sacroiliac joint area.       Assessment:      Encounter Diagnoses   Name Primary?   ??? OA (osteoarthritis) Yes   ??? Symptomatic menopausal or female climacteric states    ??? Generalized abdominal pain    ??? Type II or unspecified type diabetes mellitus without mention of complication, not stated as uncontrolled    ??? Hypertension    ??? GERD (gastroesophageal reflux disease)    ??? Osteopenia      No health maintenance topics applied.         Plan:      Orders Placed This Encounter   Procedures   ??? CT Abdomen W Contrast   ??? CT Pelvis W IV Contrast Only     No orders of the defined types were placed in this encounter.     Return in about 12 days (around 05/22/2012).         See pv    Symptoms and complaints should be improving over 48 hours and entirely resolved in two weeks, if not, patient should call the office for a follow-up appointment.

## 2012-05-22 ENCOUNTER — Telehealth

## 2012-05-22 MED ORDER — AMLODIPINE BESYLATE 10 MG PO TABS
10 MG | ORAL_TABLET | ORAL | Status: DC
Start: 2012-05-22 — End: 2012-07-31

## 2012-05-22 MED ORDER — GLIMEPIRIDE 4 MG PO TABS
4 MG | ORAL_TABLET | ORAL | Status: DC
Start: 2012-05-22 — End: 2012-07-31

## 2012-05-22 MED ORDER — ALENDRONATE SODIUM 70 MG PO TABS
70 MG | ORAL_TABLET | ORAL | Status: DC
Start: 2012-05-22 — End: 2012-07-31

## 2012-05-22 MED ORDER — ESOMEPRAZOLE MAGNESIUM 40 MG PO CPDR
40 MG | ORAL_CAPSULE | ORAL | Status: DC
Start: 2012-05-22 — End: 2012-07-31

## 2012-05-22 MED ORDER — VALSARTAN 80 MG PO TABS
80 MG | ORAL_TABLET | Freq: Every day | ORAL | Status: DC
Start: 2012-05-22 — End: 2012-07-31

## 2012-05-22 MED ORDER — METFORMIN HCL ER 500 MG PO TB24
500 MG | ORAL_TABLET | ORAL | Status: DC
Start: 2012-05-22 — End: 2012-07-31

## 2012-05-22 MED ORDER — ATORVASTATIN CALCIUM 10 MG PO TABS
10 MG | ORAL_TABLET | ORAL | Status: DC
Start: 2012-05-22 — End: 2012-07-31

## 2012-05-22 MED ORDER — SITAGLIPTIN PHOSPHATE 100 MG PO TABS
100 MG | ORAL_TABLET | ORAL | Status: DC
Start: 2012-05-22 — End: 2012-07-31

## 2012-05-22 MED ORDER — DICLOFENAC SODIUM 75 MG PO TBEC
75 MG | ORAL_TABLET | Freq: Two times a day (BID) | ORAL | Status: DC
Start: 2012-05-22 — End: 2012-07-31

## 2012-05-22 NOTE — Telephone Encounter (Signed)
Pt left without being seen, she arrived at 3:22 and left at 4:32, she said sorry, she can normally wait but had another appt today. Pt said she needs refills on everything. Can you help me with this?

## 2012-05-23 NOTE — Progress Notes (Signed)
Subjective:      Patient ID: Lori Chase is a 71 y.o. female.    HPI    Review of Systems    Objective:   Physical Exam    Assessment:      1. Type II or unspecified type diabetes mellitus without mention of complication, not stated as uncontrolled    2. Hypertension             Plan:      No orders of the defined types were placed in this encounter.       No orders of the defined types were placed in this encounter.

## 2012-05-29 NOTE — Telephone Encounter (Signed)
Order ct abd & pelvis not MRI

## 2012-05-29 NOTE — Telephone Encounter (Signed)
I got some orders on this patient and I'm confused. It's from Dr. Butrey. There's an MRI pelvis and MRI abdomen then there is CT abdomen and pelvis for this same patient.

## 2012-07-31 MED ORDER — DICLOFENAC SODIUM 75 MG PO TBEC
75 MG | ORAL_TABLET | Freq: Two times a day (BID) | ORAL | Status: DC
Start: 2012-07-31 — End: 2013-04-09

## 2012-07-31 MED ORDER — GLIMEPIRIDE 4 MG PO TABS
4 MG | ORAL_TABLET | ORAL | Status: DC
Start: 2012-07-31 — End: 2013-02-02

## 2012-07-31 MED ORDER — ATORVASTATIN CALCIUM 10 MG PO TABS
10 MG | ORAL_TABLET | ORAL | Status: DC
Start: 2012-07-31 — End: 2013-02-02

## 2012-07-31 MED ORDER — AMLODIPINE BESYLATE 10 MG PO TABS
10 MG | ORAL_TABLET | ORAL | Status: DC
Start: 2012-07-31 — End: 2013-02-02

## 2012-07-31 MED ORDER — ESOMEPRAZOLE MAGNESIUM 40 MG PO CPDR
40 MG | ORAL_CAPSULE | ORAL | Status: DC
Start: 2012-07-31 — End: 2012-11-25

## 2012-07-31 MED ORDER — ALENDRONATE SODIUM 70 MG PO TABS
70 MG | ORAL_TABLET | ORAL | Status: DC
Start: 2012-07-31 — End: 2013-02-02

## 2012-07-31 MED ORDER — SITAGLIPTIN PHOSPHATE 100 MG PO TABS
100 MG | ORAL_TABLET | ORAL | Status: DC
Start: 2012-07-31 — End: 2012-11-25

## 2012-07-31 MED ORDER — VALSARTAN 80 MG PO TABS
80 MG | ORAL_TABLET | Freq: Every day | ORAL | Status: DC
Start: 2012-07-31 — End: 2012-11-25

## 2012-07-31 MED ORDER — METFORMIN HCL ER 500 MG PO TB24
500 MG | ORAL_TABLET | ORAL | Status: DC
Start: 2012-07-31 — End: 2012-11-25

## 2012-07-31 NOTE — Addendum Note (Signed)
Addended by: Norva Riffle on: 07/31/2012 03:11 PM     Modules accepted: Orders

## 2012-07-31 NOTE — Progress Notes (Addendum)
Subjective:      Patient ID: Lori Chase is a 72 y.o. female.    HPI Comments: Patient is here for a follow up on diabetes and blood pressure.    Treatment Adherence:   Medication compliance:  compliant all of the time  Diet compliance:  sometimes  Weight trend: stable  Current exercise: exercises at home daily      Diabetes Mellitus Type 2: Current symptoms/problems include none.    Home blood sugar records:  patient tests 1 time(s) per day  Any episodes of hypoglycemia? no  Eye exam current (within one year): yes  Tobacco history: She  reports that she has never smoked. She has never used smokeless tobacco.   Daily Aspirin? No:   Known diabetic complications: none    Hypertension:  Home blood pressure monitoring: Yes -   She is adherent to a low sodium diet. Patient denies chest pain, shortness of breath, headache, lightheadedness, blurred vision, peripheral edema, palpitations, dry cough and fatigue.  Antihypertensive medication side effects: no medication side effects noted.  Use of agents associated with hypertension: none.           Lab Results   Component Value Date    LABA1C 8.7* 03/16/2012    LABA1C 8.4* 12/09/2011    LABA1C 9.4* 07/26/2011     Lab Results   Component Value Date    LABMICR 0.6 03/16/2012    CREATININE 0.67 04/26/2012     Lab Results   Component Value Date    ALT 77* 03/16/2012    AST 47* 03/16/2012     Lab Results   Component Value Date    CHOL 126 03/16/2012    TRIG 234* 03/16/2012    HDL 37* 03/16/2012    LDLCALC 42 03/16/2012            Review of Systems     FEMALE ROS:    REVIEW OF SYSTEMS:  The patient reports no problems with hearing or headaches.  She reports no problems with vision. She is advised to have a yearly eye examination. She has no chest pains or pressures, or shortness of breath.  She has no indigestion, heartburn, nausea, or vomiting.   She has no constipation, diarrhea, or  black, bloody, mucousy, or tarry stool.  She has no trouble passing urine or losing urine.   Libido seems to be normal.  She reports no musculoskeletal aches or pains.  No paresthesias or loss of strength.         Chief Complaint   Patient presents with   ??? 3 Month Follow-Up     Patient is here for a follow up on diabetes and blood pressure     Past Medical History   Diagnosis Date   ??? Type II or unspecified type diabetes mellitus without mention of complication, not stated as uncontrolled    ??? Anxiety    ??? Hypertension    ??? Cancer      breast   ??? 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 Right knee pain 01/07/2012     Past Surgical History   Procedure Laterality Date   ??? Cholecystectomy     ??? Tonsillectomy  age 29   ??? Breast lumpectomy  2007     Allergies   Allergen Reactions   ??? Morphine    ??? Oxycontin (Oxycodone)      History     Social History   ??? Marital Status: Married  Spouse Name: N/A     Number of Children: N/A   ??? Years of Education: N/A     Occupational History   ??? Not on file.     Social History Main Topics   ??? Smoking status: Never Smoker    ??? Smokeless tobacco: Never Used   ??? Alcohol Use: Yes      Comment: occ wine   ??? Drug Use: No   ??? Sexually Active: Not on file     Other Topics Concern   ??? Not on file     Social History Narrative   ??? No narrative on file     Family History   Problem Relation Age of Onset   ??? High Blood Pressure Mother    ??? High Blood Pressure Father      Current Outpatient Prescriptions on File Prior to Visit   Medication Sig Dispense Refill   ??? valsartan (DIOVAN) 80 MG tablet Take 1 tablet by mouth daily.  90 tablet  1   ??? diclofenac (VOLTAREN) 75 MG EC tablet Take 1 tablet by mouth 2 times daily (with meals).  60 tablet  1   ??? alendronate (FOSAMAX) 70 MG tablet TAKE 1 TABLET BY MOUTH ONCE A WEEK  12 tablet  1   ??? esomeprazole (NEXIUM) 40 MG capsule TAKE 1 CAPSULE BY MOUTH EVERY MORNING (BEFORE BREAKFAST).  90 capsule  1   ??? sitaGLIPtan (JANUVIA) 100 MG tablet TAKE 1 TABLET BY MOUTH EVERY DAY.  90 tablet  1   ??? metFORMIN ER (GLUCOPHAGE-XR) 500 MG XR tablet TAKE 1  TABLET BY MOUTH 4 TIMES DAILY.  360 tablet  1   ??? glimepiride (AMARYL) 4 MG tablet TAKE 2 TABLETS BY MOUTH EVERY DAY  180 tablet  1   ??? atorvastatin (LIPITOR) 10 MG tablet TAKE 1 TABLET BY MOUTH EVERY DAY.  90 tablet  1   ??? amLODIPine (NORVASC) 10 MG tablet TAKE 1 TABLET BY MOUTH EVERY DAY.  90 tablet  1   ??? Blood Glucose Monitoring Suppl (BAYER BREEZE 2 SYSTEM) W/DEVICE KIT by Does not apply route.  3 kit  0   ??? BAYER MICROLET LANCETS MISC by Does not apply route.  100 each  1   ??? ibuprofen (ADVIL;MOTRIN) 600 MG tablet        ??? aspirin (ASPIRIN CHILDRENS) 81 MG chewable tablet Take 1 tablet by mouth daily.  90 tablet  3   ??? Glucose Blood (BAYER BREEZE 2 TEST VI) by In Vitro route.         ??? meloxicam (MOBIC) 15 MG tablet Take 1 tablet by mouth daily for 30 days.  30 tablet  3     No current facility-administered medications on file prior to visit.     BP 138/88   Pulse 72   Temp(Src) 98 ??F (36.7 ??C)   Resp 14   Ht 5' 6.5" (1.689 m)   Wt 162 lb (73.483 kg)   BMI 25.76 kg/m2      Objective:   Physical Exam      PHYSICAL EXAMINATION:   VITAL SIGNS: are as recorded.  GENERAL:  The patient appears well nourished and well-developed, and with normal affect.  No acute respiratory distress. Alert and oriented times 3. No skin rashes.     HEENT:  TMs normal bilaterally.  Canals and ears normal. Pharynx is clear. Extraocular eye motions intact and pain free.   Pupils reactive and equally round.  Sclerae and conjunctivae  clear, normocephalic and atraumatic.      RESPIRATORY:  Clear and equal breath sounds with no acute respiratory distress.       HEART: Regular rhythm without murmur, rub or gallop.     ABDOMEN:  soft, nontender. No masses, guarding or rebound. Normoactive bowel sounds.     NECK: No masses or adenopathy palpable.  No bruits heard. No asymmetry visible.     COMPLETE EXTREMITIES: No edema, all four extremities with posterior tibial and radial pulses strong and palpable.     EXTREMITIES:  no edema in any  extremity. No cyanosis or clubbing.    LOW BACK: No tenderness to palpation of inferior lumbar spine or either sacroiliac joint area.       Assessment:      Encounter Diagnoses   Name Primary?   ??? Type II or unspecified type diabetes mellitus without mention of complication, not stated as uncontrolled Yes   ??? Hypertension    ??? OA (osteoarthritis)    ??? Osteopenia    ??? GERD (gastroesophageal reflux disease)    ??? Cancer    ??? Symptomatic menopausal or female climacteric states    ??? Other malaise and fatigue    ??? Screen for colon cancer    ??? OA (osteoarthritis) of knee      Health Maintenance Due   Topic Date Due   ??? Tetanus Vaccine Adult (11 Years And Up)  05/24/2012   ??? Colon Cancer Screening Colonoscopy  01/06/2013            Plan:      Orders Placed This Encounter   Medications   ??? valsartan (DIOVAN) 80 MG tablet     Sig: Take 1 tablet by mouth daily.     Dispense:  90 tablet     Refill:  1   ??? diclofenac (VOLTAREN) 75 MG EC tablet     Sig: Take 1 tablet by mouth 2 times daily (with meals).     Dispense:  60 tablet     Refill:  1   ??? alendronate (FOSAMAX) 70 MG tablet     Sig: TAKE 1 TABLET BY MOUTH ONCE A WEEK     Dispense:  12 tablet     Refill:  1   ??? esomeprazole (NEXIUM) 40 MG capsule     Sig: TAKE 1 CAPSULE BY MOUTH EVERY MORNING (BEFORE BREAKFAST).     Dispense:  90 capsule     Refill:  1   ??? sitaGLIPtan (JANUVIA) 100 MG tablet     Sig: TAKE 1 TABLET BY MOUTH EVERY DAY.     Dispense:  90 tablet     Refill:  1   ??? metFORMIN ER (GLUCOPHAGE-XR) 500 MG XR tablet     Sig: TAKE 1 TABLET BY MOUTH 4 TIMES DAILY.     Dispense:  360 tablet     Refill:  1   ??? glimepiride (AMARYL) 4 MG tablet     Sig: TAKE 2 TABLETS BY MOUTH EVERY DAY     Dispense:  180 tablet     Refill:  1   ??? atorvastatin (LIPITOR) 10 MG tablet     Sig: TAKE 1 TABLET BY MOUTH EVERY DAY.     Dispense:  90 tablet     Refill:  1   ??? amLODIPine (NORVASC) 10 MG tablet     Sig: TAKE 1 TABLET BY MOUTH EVERY DAY.     Dispense:  90 tablet  Refill:  1     Orders  Placed This Encounter   Procedures   ??? CBC Auto Differential   ??? Comprehensive metabolic panel   ??? Lipid panel     Order Specific Question:  Is Patient Fasting?/# of Hours     Answer:  yes   ??? Hemoglobin A1c   ??? Ambulatory referral to Gastroenterology     Referral Priority:  Routine     Referral Type:  Eval and Treat     Referral Reason:  Specialty Services Required     Requested Specialty:  Gastroenterology     Number of Visits Requested:  1   ??? PR TRIAMCINOLONE ACETONIDE INJ     80 mg   ??? PR DRAIN/INJECT LARGE JOINT/BURSA     Diabetes Counseling   Patient was again counseled regarding disease risks and need to adopt and maintain healthy behaviors, and was provided education materials to assist with self management. These included logs to record blood pressure, caloric intake and/or blood sugar (some given at previous visits). Patient was instructed to keep logs up-to-date and to always bring logs to all office visits.      Return in about 4 weeks (around 08/28/2012).        Symptoms and complaints should be improving over 48 hours and entirely resolved in two weeks, if not, patient should call the office for a follow-up appointment.    See pv    Risks were explained.  Consent was obtained.  Joint was injected with Kenalog & Lidocaine.  Pt tolerated procedure well.  There were no complications.L knee

## 2012-07-31 NOTE — Addendum Note (Signed)
Addended by: Norva Riffle on: 07/31/2012 03:56 PM     Modules accepted: Orders

## 2012-08-01 LAB — COMPREHENSIVE METABOLIC PANEL
ALT: 45 U/L — ABNORMAL HIGH (ref 0–33)
AST: 30 U/L (ref 0–32)
Albumin: 4.2 g/dL (ref 3.9–4.9)
Alkaline Phosphatase: 56 U/L (ref 35–104)
Anion Gap: 14 mEq/L — ABNORMAL HIGH (ref 7–13)
BUN: 12 mg/dL (ref 8–23)
CO2: 22 mEq/L (ref 22–29)
Calcium: 9.4 mg/dL (ref 8.6–10.2)
Chloride: 98 mEq/L (ref 98–107)
Creatinine: 0.53 mg/dL (ref 0.50–0.90)
GFR African American: >60 (ref >60)
GFR Non-African American: >60 (ref >60)
Globulin: 2.6 g/dL (ref 2.3–3.5)
Glucose: 293 mg/dL — ABNORMAL HIGH (ref 74–109)
Potassium: 4.9 mEq/L (ref 3.5–5.1)
Sodium: 134 mEq/L — ABNORMAL LOW (ref 136–145)
Total Bilirubin: 0.2 mg/dL (ref 0.0–1.2)
Total Protein: 6.8 g/dL (ref 6.4–8.3)

## 2012-08-01 LAB — CBC WITH DIFFERENTIAL
Basophils %: 0.3 %
Basophils Absolute: 0 10*3/uL (ref 0.0–0.2)
Eosinophils %: 1.5 %
Eosinophils Absolute: 0.2 10*3/uL (ref 0.0–0.7)
Hematocrit: 37.9 % (ref 37.0–47.0)
Hemoglobin: 11.9 g/dL — ABNORMAL LOW (ref 12.0–16.0)
Lymphocytes %: 19.8 %
Lymphocytes Absolute: 2.3 10*3/uL (ref 1.0–4.8)
MCH: 26.6 pg — ABNORMAL LOW (ref 27.0–31.3)
MCHC: 31.3 % — ABNORMAL LOW (ref 33.0–37.0)
MCV: 84.7 fL (ref 82.0–100.0)
MPV: 11.3 fL — ABNORMAL HIGH (ref 7.4–10.4)
Monocytes %: 5.8 %
Monocytes Absolute: 0.7 10*3/uL (ref 0.2–0.8)
Neutrophils %: 72.6 %
Neutrophils Absolute: 8.3 10*3/uL — ABNORMAL HIGH (ref 1.4–6.5)
Platelets: 226 10*3/uL (ref 130–400)
RBC: 4.47 M/uL (ref 4.20–5.40)
RDW: 15.9 % — ABNORMAL HIGH (ref 11.5–14.5)
WBC: 11.5 10*3/uL — ABNORMAL HIGH (ref 4.8–10.8)

## 2012-08-01 LAB — LIPID PANEL
Cholesterol, Total: 135 mg/dL (ref 0–199)
HDL: 48 mg/dL
LDL Calculated: 54 mg/dL (ref 0–129)
Triglycerides: 167 mg/dL (ref 0–200)

## 2012-08-01 LAB — HEMOGLOBIN A1C: Hemoglobin A1C: 8.7 % — ABNORMAL HIGH (ref 4.8–5.9)

## 2012-08-21 NOTE — Care Coordination-Inpatient (Signed)
Patient targeted by Dr. Alphia Moh for care coordination due to diabetes with risk factors of hyperlipidemia, HTN, and obesity. Care coordination letter mailed to patient. Will f/u for initial meeting at f/u with Dr. Alphia Moh 08-31-12.

## 2012-08-31 ENCOUNTER — Encounter

## 2012-08-31 NOTE — Care Coordination-Inpatient (Signed)
Initial meeting today with Lori Chase prior to seeing Dr. Alphia Moh. Very pleasant alert and oriented women. Introduced care coordination and role for NCC and patient. She agreed. Discussed disease entity of diabetes, healthy diabetic diet, food portions and exercise. Patient eats large amounts of bread. Patient eats large portions of food.  Patient exercises 20 minutes daily for cardiac and resistance. Patient c/o cramping to both ankles about 3x/ week. Dr. Alphia Moh had told her to drink Propel however this is not working for cramping. It happens in bed not while active during day. Patient to discuss with Dr. Alphia Moh for other options.    Educational materials of Diabetes and Healthy Eating, Eat this not that, Diabetes and Hidden sugars, Diabetes and Carbohydrates, Diabetes Care schedule, and Numbers that Count for a healthy heart.    Patient set own goals as marked above.     Plan: Communicate with patient monthly via telephone or office visits.     Support, motivate and educate patient as needed

## 2012-08-31 NOTE — Patient Instructions (Signed)
Diabetes Management    Blood Sugar Log  Day Breakfast Lunch Dinner    Before 2 hrs After Before 2 hrs After Before 2 hrs After    Time Number Time  Number Time Number Time Number Time  Number Time Number   Sun               Mon               Tues               Wed               Thurs               Fri               Sat                 1. Keep your blood sugar level (A1c) below 7.0%    If your blood sugar is ABOVE 7.0%, get an A1c test every 3 months.     If your blood sugar is BELOW 7.0%, get an A1c test every 6 months.    2. Keep your blood pressure below 130/80    Make sure your blood pressure is measured at every office visit    3. Obtain a foot exam once a year during your doctor???s visit; also, do foot exam on yourself every week or have someone do it for you     4. Keep your LDL (bad) cholesterol level below 100    Make sure your lipids are measured once a year     5. Obtain a urine test (microalbumin) once a year    6. Obtain an eye exam once a year    7. Learn to stay healthy living with Diabetes    Attend Diabetes Education course if ordered by your physician    8. Exercise and slowly progress to (brisk walking, bike riding, etc) 30 minutes 3 to 5 days a week.    9. Obtain annual flu shot    10. Obtain pneumonia shot if you have not done so  11. Refer to patient education handouts given to you today    Diabetes Management Community Resources   Organization Address Program Phone / Website   [facility] [address] [place] [address] Diabetes Self-Management  [phone]   [facility] [address] [place], [place] Diabetes Self-Management  [phone]   [facility] [place] [address] [place], [place]  Diabetes Self-Management  [phone]   [facility] [address] [place] [address] Help with Food [phone]   Partnership for Prescription Assistance n/a Medications [phone]

## 2012-08-31 NOTE — Progress Notes (Signed)
Subjective:      Patient ID: Lori Chase is a 72 y.o. female.    HPI  Treatment Adherence:   Medication compliance:  compliant all of the time  Diet compliance:  compliant most of the time  Weight trend: stable  Current exercise: daily at home      Diabetes Mellitus Type 2: Current symptoms/problems include none.    Home blood sugar records:  patient tests 1 time(s) per day  Any episodes of hypoglycemia? no  Eye exam current (within one year): yes  Tobacco history: She  reports that she has never smoked. She has never used smokeless tobacco.   Daily Aspirin? Yes  Known diabetic complications: none    Hypertension:  Home blood pressure monitoring: Yes - 1 x per week  She is not adherent to a low sodium diet. Patient denies chest pain, shortness of breath, headache, lightheadedness, blurred vision, peripheral edema, palpitations, dry cough and fatigue.  Antihypertensive medication side effects: no medication side effects noted.  Use of agents associated with hypertension: none.     Hyperlipidemia:  No new myalgias or GI upset on atorvastatin (Lipitor).       Lab Results   Component Value Date    LABA1C 8.7* 07/31/2012    LABA1C 8.7* 03/16/2012    LABA1C 8.4* 12/09/2011     Lab Results   Component Value Date    LABMICR 0.6 03/16/2012    CREATININE 0.53 07/31/2012     Lab Results   Component Value Date    ALT 45* 07/31/2012    AST 30 07/31/2012     Lab Results   Component Value Date    CHOL 135 07/31/2012    TRIG 167 07/31/2012    HDL 48 07/31/2012    LDLCALC 54 07/31/2012            Review of Systems     FEMALE ROS:    REVIEW OF SYSTEMS:  The patient reports no problems with hearing or headaches.  She reports no problems with vision. She is advised to have a yearly eye examination. She has no chest pains or pressures, or shortness of breath.  She has no indigestion, heartburn, nausea, or vomiting.   She has no constipation, diarrhea, or  black, bloody, mucousy, or tarry stool.  She has no trouble passing urine or losing  urine.  Libido seems to be normal.  She reports no musculoskeletal aches or pains.  No paresthesias or loss of strength.        Chief Complaint   Patient presents with   ??? 1 Month Follow-Up     Patient is here for a follow up on her blood pressure and diabetes.     Past Medical History   Diagnosis Date   ??? Type II or unspecified type diabetes mellitus without mention of complication, not stated as uncontrolled    ??? Anxiety    ??? Hypertension    ??? Cancer      breast   ??? 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 Right knee pain 01/07/2012     Past Surgical History   Procedure Laterality Date   ??? Cholecystectomy     ??? Tonsillectomy  age 84   ??? Breast lumpectomy  2007     Allergies   Allergen Reactions   ??? Morphine    ??? Oxycontin (Oxycodone)      History     Social History   ??? Marital Status: Married  Spouse Name: N/A     Number of Children: N/A   ??? Years of Education: N/A     Occupational History   ??? Not on file.     Social History Main Topics   ??? Smoking status: Never Smoker    ??? Smokeless tobacco: Never Used   ??? Alcohol Use: Yes      Comment: occ wine   ??? Drug Use: No   ??? Sexually Active: Not on file     Other Topics Concern   ??? Not on file     Social History Narrative   ??? No narrative on file     Family History   Problem Relation Age of Onset   ??? High Blood Pressure Mother    ??? High Blood Pressure Father      Current Outpatient Prescriptions on File Prior to Visit   Medication Sig Dispense Refill   ??? valsartan (DIOVAN) 80 MG tablet Take 1 tablet by mouth daily.  90 tablet  1   ??? diclofenac (VOLTAREN) 75 MG EC tablet Take 1 tablet by mouth 2 times daily (with meals).  60 tablet  1   ??? alendronate (FOSAMAX) 70 MG tablet TAKE 1 TABLET BY MOUTH ONCE A WEEK  12 tablet  1   ??? esomeprazole (NEXIUM) 40 MG capsule TAKE 1 CAPSULE BY MOUTH EVERY MORNING (BEFORE BREAKFAST).  90 capsule  1   ??? sitaGLIPtan (JANUVIA) 100 MG tablet TAKE 1 TABLET BY MOUTH EVERY DAY.  90 tablet  1   ??? metFORMIN ER (GLUCOPHAGE-XR) 500 MG XR tablet  TAKE 1 TABLET BY MOUTH 4 TIMES DAILY.  360 tablet  1   ??? glimepiride (AMARYL) 4 MG tablet TAKE 2 TABLETS BY MOUTH EVERY DAY  180 tablet  1   ??? atorvastatin (LIPITOR) 10 MG tablet TAKE 1 TABLET BY MOUTH EVERY DAY.  90 tablet  1   ??? amLODIPine (NORVASC) 10 MG tablet TAKE 1 TABLET BY MOUTH EVERY DAY.  90 tablet  1   ??? Blood Glucose Monitoring Suppl (BAYER BREEZE 2 SYSTEM) W/DEVICE KIT by Does not apply route.  3 kit  0   ??? BAYER MICROLET LANCETS MISC by Does not apply route.  100 each  1   ??? ibuprofen (ADVIL;MOTRIN) 600 MG tablet        ??? aspirin (ASPIRIN CHILDRENS) 81 MG chewable tablet Take 1 tablet by mouth daily.  90 tablet  3   ??? Glucose Blood (BAYER BREEZE 2 TEST VI) by In Vitro route.         ??? meloxicam (MOBIC) 15 MG tablet Take 1 tablet by mouth daily for 30 days.  30 tablet  3     No current facility-administered medications on file prior to visit.     BP 160/80   Pulse 66   Temp(Src) 98.2 ??F (36.8 ??C)   Resp 14   Ht 5' 6.5" (1.689 m)   Wt 161 lb (73.029 kg)   BMI 25.6 kg/m2      Objective:   Physical Exam      PHYSICAL EXAMINATION:   VITAL SIGNS: are as recorded.  GENERAL:  The patient appears well nourished and well-developed, and with normal affect.  No acute respiratory distress. Alert and oriented times 3. No skin rashes.     HEENT:  TMs normal bilaterally.  Canals and ears normal. Pharynx is clear. Extraocular eye motions intact and pain free.   Pupils reactive and equally round.  Sclerae and conjunctivae  clear, normocephalic and atraumatic.      RESPIRATORY:  Clear and equal breath sounds with no acute respiratory distress.       HEART: Regular rhythm without murmur, rub or gallop.     ABDOMEN:  soft, nontender. No masses, guarding or rebound. Normoactive bowel sounds.     NECK: No masses or adenopathy palpable.  No bruits heard. No asymmetry visible.     COMPLETE EXTREMITIES: No edema, all four extremities with posterior tibial and radial pulses strong and palpable.     EXTREMITIES:  no edema in any  extremity. No cyanosis or clubbing.    LOW BACK: No tenderness to palpation of inferior lumbar spine or either sacroiliac joint area.       Assessment:      Encounter Diagnoses   Name Primary?   ??? Screening Yes   ??? Type II or unspecified type diabetes mellitus without mention of complication, not stated as uncontrolled    ??? Hypertension    ??? GERD (gastroesophageal reflux disease)    ??? Osteopenia    ??? Symptomatic menopausal or female climacteric states    ??? OA (osteoarthritis)      Health Maintenance Due   Topic Date Due   ??? Tetanus Vaccine Adult (11 Years And Up)  05/24/2012   ??? Colon Cancer Screening Colonoscopy  01/06/2013            Plan:      No orders of the defined types were placed in this encounter.     Orders Placed This Encounter   Procedures   ??? HM FALL RISK       Diabetes Counseling   Patient was again counseled regarding disease risks and need to adopt and maintain healthy behaviors, and was provided education materials to assist with self management. These included logs to record blood pressure, caloric intake and/or blood sugar (some given at previous visits). Patient was instructed to keep logs up-to-date and to always bring logs to all office visits.      Return in about 4 weeks (around 09/28/2012).        Symptoms and complaints should be improving over 48 hours and entirely resolved in two weeks, if not, patient should call the office for a follow-up appointment.    See pv    Lose wt    BP next

## 2012-09-01 NOTE — Care Coordination-Inpatient (Signed)
Initial evaluation and PHQ 9 completed today at initial meeting. Placed in doc flowsheets

## 2012-09-01 NOTE — Patient Instructions (Signed)
F/u with Dr. Alphia Moh every 3 months or according to Dr. Wende Neighbors orders if sooner.

## 2012-09-28 ENCOUNTER — Encounter

## 2012-09-28 MED ORDER — CEFDINIR 300 MG PO CAPS
300 MG | ORAL_CAPSULE | ORAL | Status: AC
Start: 2012-09-28 — End: 2012-10-08

## 2012-09-28 NOTE — Care Coordination-Inpatient (Signed)
Lori Chase  09/28/2012                Nurse Care Coordinator Progress Note    Assessment: Lori Chase is a 72 y.o. female.  Saw Lori Chase at office today.    Goals     ??? Blood sugars BID, log and bring to office.       ??? Cut bread intake in half      ??? Portion control         Barriers to meeting goals: lack of motivation and stress    Medication Reconciliation performed: No:     Patient Action:  Patient states she has been trying with her goals. Her blood sugars have been in the 130's. She states she forgot to bring in the log. States she replaced her butter with olive oil. She is not eating very much bread. She is exercising portion control. Cont.with goals.  Discussed home issues.    Plan of Care:     Communicate monthly with patient via telephone or office visit.    Support, motivate and educate patient as necessary.

## 2012-09-28 NOTE — Progress Notes (Signed)
Subjective:      Patient ID: Lori Chase is a 72 y.o. female.    HPI Comments: Patient is here for a 4 week follow up and to discuss the results of her colonoscopy done on 09/18/12.      Review of Systems     FEMALE ROS:    REVIEW OF SYSTEMS:  The patient reports no problems with hearing or headaches.  She reports no problems with vision. She is advised to have a yearly eye examination. She has no chest pains or pressures, or shortness of breath.  She has no indigestion, heartburn, nausea, or vomiting.   She has no constipation, diarrhea, or  black, bloody, mucousy, or tarry stool.  She has no trouble passing urine or losing urine.  Libido seems to be normal.  She reports no musculoskeletal aches or pains.  No paresthesias or loss of strength.        Chief Complaint   Patient presents with   ??? 1 Month Follow-Up     Patient is here for a 4 week follow up and to discuss the results of her colonoscopy done on 09/18/12.     Past Medical History   Diagnosis Date   ??? Type II or unspecified type diabetes mellitus without mention of complication, not stated as uncontrolled    ??? Anxiety    ??? Hypertension    ??? Cancer      breast   ??? 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 Right knee pain 01/07/2012   ??? Colon polyp      Past Surgical History   Procedure Laterality Date   ??? Cholecystectomy     ??? Tonsillectomy  age 23   ??? Breast lumpectomy  2007   ??? Colonoscopy  09/18/12     [name]     Allergies   Allergen Reactions   ??? Morphine    ??? Oxycontin (Oxycodone)      History     Social History   ??? Marital Status: Married     Spouse Name: N/A     Number of Children: N/A   ??? Years of Education: N/A     Occupational History   ??? Not on file.     Social History Main Topics   ??? Smoking status: Never Smoker    ??? Smokeless tobacco: Never Used   ??? Alcohol Use: Yes      Comment: occ wine   ??? Drug Use: No   ??? Sexually Active: Not on file     Other Topics Concern   ??? Not on file     Social History Narrative   ??? No narrative on file      Family History   Problem Relation Age of Onset   ??? High Blood Pressure Mother    ??? High Blood Pressure Father      Current Outpatient Prescriptions on File Prior to Visit   Medication Sig Dispense Refill   ??? valsartan (DIOVAN) 80 MG tablet Take 1 tablet by mouth daily.  90 tablet  1   ??? diclofenac (VOLTAREN) 75 MG EC tablet Take 1 tablet by mouth 2 times daily (with meals).  60 tablet  1   ??? alendronate (FOSAMAX) 70 MG tablet TAKE 1 TABLET BY MOUTH ONCE A WEEK  12 tablet  1   ??? esomeprazole (NEXIUM) 40 MG capsule TAKE 1 CAPSULE BY MOUTH EVERY MORNING (BEFORE BREAKFAST).  90 capsule  1   ??? sitaGLIPtan (JANUVIA)  100 MG tablet TAKE 1 TABLET BY MOUTH EVERY DAY.  90 tablet  1   ??? metFORMIN ER (GLUCOPHAGE-XR) 500 MG XR tablet TAKE 1 TABLET BY MOUTH 4 TIMES DAILY.  360 tablet  1   ??? glimepiride (AMARYL) 4 MG tablet TAKE 2 TABLETS BY MOUTH EVERY DAY  180 tablet  1   ??? atorvastatin (LIPITOR) 10 MG tablet TAKE 1 TABLET BY MOUTH EVERY DAY.  90 tablet  1   ??? amLODIPine (NORVASC) 10 MG tablet TAKE 1 TABLET BY MOUTH EVERY DAY.  90 tablet  1   ??? Blood Glucose Monitoring Suppl (BAYER BREEZE 2 SYSTEM) W/DEVICE KIT by Does not apply route.  3 kit  0   ??? BAYER MICROLET LANCETS MISC by Does not apply route.  100 each  1   ??? ibuprofen (ADVIL;MOTRIN) 600 MG tablet        ??? aspirin (ASPIRIN CHILDRENS) 81 MG chewable tablet Take 1 tablet by mouth daily.  90 tablet  3   ??? Glucose Blood (BAYER BREEZE 2 TEST VI) by In Vitro route.         ??? meloxicam (MOBIC) 15 MG tablet Take 1 tablet by mouth daily for 30 days.  30 tablet  3     No current facility-administered medications on file prior to visit.     BP 146/82   Pulse 66   Temp(Src) 98.2 ??F (36.8 ??C)   Resp 14   Ht 5' 5.5" (1.664 m)   Wt 157 lb (71.215 kg)   BMI 25.72 kg/m2      Objective:   Physical Exam      PHYSICAL EXAMINATION:   VITAL SIGNS: are as recorded.  GENERAL:  The patient appears well nourished and well-developed, and with normal affect.  No acute respiratory distress.  Alert and oriented times 3. No skin rashes.     HEENT:  TMs normal bilaterally.  Canals and ears normal. Pharynx is clear. Extraocular eye motions intact and pain free.   Pupils reactive and equally round.  Sclerae and conjunctivae clear, normocephalic and atraumatic.      RESPIRATORY:  Clear and equal breath sounds with no acute respiratory distress.       HEART: Regular rhythm without murmur, rub or gallop.     ABDOMEN:  soft, nontender. No masses, guarding or rebound. Normoactive bowel sounds.     NECK: No masses or adenopathy palpable.  No bruits heard. No asymmetry visible.     COMPLETE EXTREMITIES: No edema, all four extremities with posterior tibial and radial pulses strong and palpable.     EXTREMITIES:  no edema in any extremity. No cyanosis or clubbing.    LOW BACK: No tenderness to palpation of inferior lumbar spine or either sacroiliac joint area.       Assessment:      Encounter Diagnoses   Name Primary?   ??? OM (otitis media), acute, left Yes   ??? Type II or unspecified type diabetes mellitus without mention of complication, not stated as uncontrolled    ??? Hypertension    ??? GERD (gastroesophageal reflux disease)    ??? Osteopenia      Health Maintenance Due   Topic Date Due   ??? Tetanus Vaccine Adult (11 Years And Up)  05/24/2012            Plan:      Orders Placed This Encounter   Medications   ??? cefdinir (OMNICEF) 300 MG capsule     Sig:  2 qd     Dispense:  20 capsule     Refill:  0     No orders of the defined types were placed in this encounter.     Return in about 4 weeks (around 10/26/2012).        BW next    Symptoms and complaints should be improving over 48 hours and entirely resolved in two weeks, if not, patient should call the office for a follow-up appointment.    See pv    BP next

## 2012-10-31 NOTE — Care Coordination-Inpatient (Signed)
Called Lori Chase for monthly progress / unable to leave message as mailbox full.

## 2012-11-25 ENCOUNTER — Encounter

## 2012-11-25 MED ORDER — SITAGLIPTIN PHOSPHATE 100 MG PO TABS
100 MG | ORAL_TABLET | ORAL | Status: DC
Start: 2012-11-25 — End: 2013-02-02

## 2012-11-25 MED ORDER — METFORMIN HCL ER 500 MG PO TB24
500 MG | ORAL_TABLET | ORAL | Status: DC
Start: 2012-11-25 — End: 2013-02-02

## 2012-11-25 MED ORDER — VALSARTAN 80 MG PO TABS
80 MG | ORAL_TABLET | Freq: Every day | ORAL | Status: DC
Start: 2012-11-25 — End: 2013-02-02

## 2012-11-25 MED ORDER — ESOMEPRAZOLE MAGNESIUM 40 MG PO CPDR
40 MG | ORAL_CAPSULE | ORAL | Status: DC
Start: 2012-11-25 — End: 2013-02-02

## 2012-11-29 NOTE — Care Coordination-Inpatient (Signed)
ELISHA MCGRUDER  11/29/2012                Nurse Care Coordinator Progress Note    Assessment: Kaidence is a 72 y.o. female.  Spoke with Ingrid today. Phone is now working. She states she did have trouble with phone.     Goals     ??? Blood sugars BID, log and bring to office.       ??? Cut bread intake in half      ??? Portion control         Barriers to meeting goals: lack of motivation. Overwhelmed with care of husband    Medication Reconciliation performed: Yes    Patient Action:Spoke with Tarah/ states her husband is coming tomorrow for her 7p appt as he is not well. She will be coming next Tuesday at 12:30p. She states she is logging her blood sugars and NCC reminded her to bring to office next week. She agreed. She states she did cut her portions but admits to having problems cutting her bread. She said she will keep trying. Encouragement given. Cont.with goals.       Plan of Care:     Communicate monthly with patient via telephone or office visit.     Support, motivate and educate patient as needed.

## 2012-12-06 ENCOUNTER — Encounter

## 2012-12-06 MED ORDER — AZITHROMYCIN 250 MG PO TABS
250 MG | PACK | ORAL | Status: AC
Start: 2012-12-06 — End: 2012-12-16

## 2012-12-06 NOTE — Care Coordination-Inpatient (Signed)
Lori Chase  12/06/2012                Nurse Care Coordinator Progress Note    Assessment: Lori Chase is a 72 y.o. female.  Visited with patient at an office visit with Dr. Alphia Chase.    Goals     ??? Blood sugars BID, log and bring to office.       ??? Cut bread intake in half      ??? HEMOGLOBIN A1C < 7.0      ??? LDL CALC < 130      ??? Portion control         Barriers to meeting goals: lack of motivation    Medication Reconciliation performed: Yes    Patient Action:   When NCC walked into room, patient stated she was hoping I would not be here today. She states she has not been following some of her goals well. She states she is having a hard time reducing her white bread. She states she is trying to do portion control. Discussed priorities and it is patient's health that is effected. Motivation given. NCC explained role is to help her to get healthy. She states her daughter has been telling her the same thing. Patient states she will really try this time.      Plan of Care:    Communicate with patient monthly via telephone or office visit.    Support, motivate and educate patient as needed.

## 2012-12-06 NOTE — Progress Notes (Signed)
Subjective:      Patient ID: Lori Chase is a 72 y.o. female.     Chief Complaint   Patient presents with   ??? Diabetes     Patient is here for a follow up on her sugar     Past Medical History   Diagnosis Date   ??? Type II or unspecified type diabetes mellitus without mention of complication, not stated as uncontrolled    ??? Anxiety    ??? Hypertension    ??? Cancer (HCC)      breast   ??? 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 Right knee pain 01/07/2012   ??? Colon polyp      Past Surgical History   Procedure Laterality Date   ??? Cholecystectomy     ??? Tonsillectomy  age 42   ??? Breast lumpectomy  2007   ??? Colonoscopy  09/18/12     [name]     Allergies   Allergen Reactions   ??? Morphine    ??? Oxycontin [Oxycodone]      History     Social History   ??? Marital Status: Married     Spouse Name: N/A     Number of Children: N/A   ??? Years of Education: N/A     Occupational History   ??? Not on file.     Social History Main Topics   ??? Smoking status: Never Smoker    ??? Smokeless tobacco: Never Used   ??? Alcohol Use: Yes      Comment: occ wine   ??? Drug Use: No   ??? Sexual Activity: Not on file     Other Topics Concern   ??? Not on file     Social History Narrative     Family History   Problem Relation Age of Onset   ??? High Blood Pressure Mother    ??? High Blood Pressure Father      Current Outpatient Prescriptions on File Prior to Visit   Medication Sig Dispense Refill   ??? sitaGLIPtan (JANUVIA) 100 MG tablet TAKE 1 TABLET BY MOUTH EVERY DAY.  90 tablet  1   ??? esomeprazole (NEXIUM) 40 MG capsule TAKE 1 CAPSULE BY MOUTH EVERY MORNING (BEFORE BREAKFAST).  90 capsule  1   ??? valsartan (DIOVAN) 80 MG tablet Take 1 tablet by mouth daily.  90 tablet  1   ??? metFORMIN ER (GLUCOPHAGE-XR) 500 MG XR tablet TAKE 1 TABLET BY MOUTH 4 TIMES DAILY.  360 tablet  1   ??? SUPREP BOWEL PREP SOLN        ??? diclofenac (VOLTAREN) 75 MG EC tablet Take 1 tablet by mouth 2 times daily (with meals).  60 tablet  1   ??? alendronate (FOSAMAX) 70 MG tablet TAKE 1 TABLET BY  MOUTH ONCE A WEEK  12 tablet  1   ??? glimepiride (AMARYL) 4 MG tablet TAKE 2 TABLETS BY MOUTH EVERY DAY  180 tablet  1   ??? atorvastatin (LIPITOR) 10 MG tablet TAKE 1 TABLET BY MOUTH EVERY DAY.  90 tablet  1   ??? amLODIPine (NORVASC) 10 MG tablet TAKE 1 TABLET BY MOUTH EVERY DAY.  90 tablet  1   ??? Blood Glucose Monitoring Suppl (BAYER BREEZE 2 SYSTEM) W/DEVICE KIT by Does not apply route.  3 kit  0   ??? BAYER MICROLET LANCETS MISC by Does not apply route.  100 each  1   ??? ibuprofen (ADVIL;MOTRIN) 600 MG tablet        ???  aspirin (ASPIRIN CHILDRENS) 81 MG chewable tablet Take 1 tablet by mouth daily.  90 tablet  3   ??? Glucose Blood (BAYER BREEZE 2 TEST VI) by In Vitro route.         ??? meloxicam (MOBIC) 15 MG tablet Take 1 tablet by mouth daily for 30 days.  30 tablet  3     No current facility-administered medications on file prior to visit.     BP 136/76   Pulse 72   Temp(Src) 97.6 ??F (36.4 ??C)   Resp 14   Ht 5' 6.5" (1.689 m)   Wt 157 lb (71.215 kg)   BMI 24.96 kg/m2      Diabetes  She presents for her follow-up diabetic visit. She has type 2 diabetes mellitus. Pertinent negatives for hypoglycemia include no confusion, dizziness or headaches. Pertinent negatives for diabetes include no blurred vision, no chest pain and no fatigue. Risk factors for coronary artery disease include post-menopausal, dyslipidemia, diabetes mellitus and hypertension. Current diabetic treatment includes oral agent (triple therapy). She is compliant with treatment all of the time. She rarely participates in exercise. She does not see a podiatrist.Eye exam is current.     Treatment Adherence:   Medication compliance:  compliant all of the time  Diet compliance:  compliant most of the time  Weight trend: stable  Current exercise: no regular exercise      Diabetes Mellitus Type 2: Current symptoms/problems include none.    Home blood sugar records:  patient tests 1 time(s) per day  Any episodes of hypoglycemia? no  Eye exam current (within one  year): yes  Tobacco history: She  reports that she has never smoked. She has never used smokeless tobacco.   Daily Aspirin? Yes  Known diabetic complications: none    Hypertension:  Home blood pressure monitoring: No.  She is adherent to a low sodium diet. Patient denies chest pain, shortness of breath, headache, lightheadedness, blurred vision, peripheral edema, palpitations, dry cough and fatigue.  Antihypertensive medication side effects: no medication side effects noted.  Use of agents associated with hypertension: none.     Hyperlipidemia:  No new myalgias or GI upset on atorvastatin (Lipitor).       Lab Results   Component Value Date    LABA1C 8.7* 07/31/2012    LABA1C 8.7* 03/16/2012    LABA1C 8.4* 12/09/2011     Lab Results   Component Value Date    LABMICR 0.6 03/16/2012    CREATININE 0.53 07/31/2012     Lab Results   Component Value Date    ALT 45* 07/31/2012    AST 30 07/31/2012     Lab Results   Component Value Date    CHOL 135 07/31/2012    TRIG 167 07/31/2012    HDL 48 07/31/2012    LDLCALC 54 07/31/2012            Review of Systems   Constitutional: Negative for fatigue.   Eyes: Negative for blurred vision.   Cardiovascular: Negative for chest pain.   Neurological: Negative for dizziness and headaches.   Psychiatric/Behavioral: Negative for confusion.       Objective:   Physical Exam      PHYSICAL EXAMINATION:   VITAL SIGNS: are as recorded.  GENERAL:  The patient appears well nourished and well-developed, and with normal affect.  No acute respiratory distress. Alert and oriented times 3. No skin rashes.     HEENT:  TMs normal bilaterally.  Canals and ears  normal. Pharynx is clear. Extraocular eye motions intact and pain free.   Pupils reactive and equally round.  Sclerae and conjunctivae clear, normocephalic and atraumatic.      RESPIRATORY:  Clear and equal breath sounds with no acute respiratory distress.       HEART: Regular rhythm without murmur, rub or gallop.     ABDOMEN:  soft, nontender. No masses,  guarding or rebound. Normoactive bowel sounds.     NECK: No masses or adenopathy palpable.  No bruits heard. No asymmetry visible.     COMPLETE EXTREMITIES: No edema, all four extremities with posterior tibial and radial pulses strong and palpable.     EXTREMITIES:  no edema in any extremity. No cyanosis or clubbing.    LOW BACK: No tenderness to palpation of inferior lumbar spine or either sacroiliac joint area.       Assessment:      Encounter Diagnoses   Name Primary?   ??? Type II or unspecified type diabetes mellitus without mention of complication, not stated as uncontrolled Yes   ??? Osteopenia    ??? GERD (gastroesophageal reflux disease)    ??? Hypertension    ??? Symptomatic menopausal or female climacteric states    ??? Other malaise and fatigue    ??? OA (osteoarthritis)    ??? Acute bronchitis    ??? Other screening mammogram      Health Maintenance Due   Topic Date Due   ??? BREAST CANCER SCREENING  12/08/2012            Plan:      Orders Placed This Encounter   Medications   ??? azithromycin (ZITHROMAX) 250 MG tablet     Sig: Take 2 tabs (500 mg) on Day 1, and take 1 tab (250 mg) on days 2 through 5.     Dispense:  1 packet     Refill:  0     Orders Placed This Encounter   Procedures   ??? MAM Digital Screen Bilateral   ??? CBC Auto Differential   ??? Comprehensive metabolic panel   ??? Lipid panel     Order Specific Question:  Is Patient Fasting?/# of Hours     Answer:  yes   ??? Hemoglobin A1c      Diabetes Counseling   Patient was again counseled regarding disease risks and need to adopt and maintain healthy behaviors, and was provided education materials to assist with self management. These included logs to record blood pressure, caloric intake and/or blood sugar (some given at previous visits). Patient was instructed to keep logs up-to-date and to always bring logs to all office visits.      Return in about 2 weeks (around 12/20/2012).          Symptoms and complaints should be improving over 48 hours and entirely resolved in  two weeks, if not, patient should call the office for a follow-up appointment.    See pv    BP next

## 2012-12-07 LAB — COMPREHENSIVE METABOLIC PANEL
ALT: 41 U/L — ABNORMAL HIGH (ref 0–33)
AST: 27 U/L (ref 0–35)
Albumin: 4.3 g/dL (ref 3.9–4.9)
Alkaline Phosphatase: 57 U/L (ref 40–130)
Anion Gap: 14 mEq/L — ABNORMAL HIGH (ref 7–13)
BUN: 13 mg/dL (ref 8–23)
CO2: 25 mEq/L (ref 22–29)
Calcium: 10 mg/dL (ref 8.6–10.2)
Chloride: 95 mEq/L — ABNORMAL LOW (ref 98–107)
Creatinine: 0.65 mg/dL (ref 0.50–0.90)
GFR African American: >60 (ref >60)
GFR Non-African American: >60 (ref >60)
Globulin: 2.6 g/dL (ref 2.3–3.5)
Glucose: 217 mg/dL — ABNORMAL HIGH (ref 74–109)
Potassium: 5.1 mEq/L (ref 3.5–5.1)
Sodium: 134 mEq/L (ref 132–144)
Total Bilirubin: 0.3 mg/dL (ref 0.0–1.2)
Total Protein: 6.9 g/dL (ref 6.4–8.1)

## 2012-12-07 LAB — CBC WITH DIFFERENTIAL
Basophils %: 0.3 %
Basophils Absolute: 0 10*3/uL (ref 0.0–0.2)
Eosinophils %: 1.2 %
Eosinophils Absolute: 0.1 10*3/uL (ref 0.0–0.7)
Hematocrit: 38.1 % (ref 37.0–47.0)
Hemoglobin: 12.1 g/dL (ref 12.0–16.0)
Lymphocytes %: 19.7 %
Lymphocytes Absolute: 2.2 10*3/uL (ref 1.0–4.8)
MCH: 27.3 pg (ref 27.0–31.3)
MCHC: 31.8 % — ABNORMAL LOW (ref 33.0–37.0)
MCV: 85.9 fL (ref 82.0–100.0)
MPV: 11.2 fL — ABNORMAL HIGH (ref 7.4–10.4)
Monocytes %: 7.6 %
Monocytes Absolute: 0.9 10*3/uL — ABNORMAL HIGH (ref 0.2–0.8)
Neutrophils %: 71.2 %
Neutrophils Absolute: 8 10*3/uL — ABNORMAL HIGH (ref 1.4–6.5)
Platelets: 255 10*3/uL (ref 130–400)
RBC: 4.44 M/uL (ref 4.20–5.40)
RDW: 15.4 % — ABNORMAL HIGH (ref 11.5–14.5)
WBC: 11.2 10*3/uL — ABNORMAL HIGH (ref 4.8–10.8)

## 2012-12-07 LAB — LIPID PANEL
Cholesterol, Total: 131 mg/dL (ref 0–199)
HDL: 49 mg/dL (ref 40–59)
LDL Calculated: 53 mg/dL (ref 0–129)
Triglycerides: 146 mg/dL (ref 0–200)

## 2012-12-07 LAB — HEMOGLOBIN A1C: Hemoglobin A1C: 9.4 % — ABNORMAL HIGH (ref 4.8–5.9)

## 2012-12-07 NOTE — Care Coordination-Inpatient (Signed)
Called Evie to discuss [facility] results done yesterday of 9.4/ elevated from 8.7. Left message to return my call.

## 2013-01-05 ENCOUNTER — Encounter

## 2013-01-05 MED ORDER — DAPAGLIFLOZIN PROPANEDIOL 5 MG PO TABS
5 MG | ORAL_TABLET | Freq: Every morning | ORAL | Status: DC
Start: 2013-01-05 — End: 2013-02-15

## 2013-01-05 NOTE — Progress Notes (Signed)
Subjective:      Patient ID: Lori Chase is a 72 y.o. female.     Chief Complaint   Patient presents with   ??? Diabetes     2 wk follow up. No issues or concerns at this time. Review recent blood work.      Past Medical History   Diagnosis Date   ??? Type II or unspecified type diabetes mellitus without mention of complication, not stated as uncontrolled    ??? Anxiety    ??? Hypertension    ??? Cancer (HCC)      breast   ??? 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 Right knee pain 01/07/2012   ??? Colon polyp      Past Surgical History   Procedure Laterality Date   ??? Cholecystectomy     ??? Tonsillectomy  age 20   ??? Breast lumpectomy  2007   ??? Colonoscopy  09/18/12     [name]     Allergies   Allergen Reactions   ??? Morphine    ??? Oxycontin [Oxycodone]      History     Social History   ??? Marital Status: Married     Spouse Name: N/A     Number of Children: N/A   ??? Years of Education: N/A     Occupational History   ??? Not on file.     Social History Main Topics   ??? Smoking status: Never Smoker    ??? Smokeless tobacco: Never Used   ??? Alcohol Use: Yes      Comment: occ wine   ??? Drug Use: No   ??? Sexual Activity: Not on file     Other Topics Concern   ??? Not on file     Social History Narrative     Family History   Problem Relation Age of Onset   ??? High Blood Pressure Mother    ??? High Blood Pressure Father      Current Outpatient Prescriptions on File Prior to Visit   Medication Sig Dispense Refill   ??? diclofenac (VOLTAREN) 75 MG EC tablet Take 1 tablet by mouth 2 times daily (with meals).  60 tablet  1   ??? Blood Glucose Monitoring Suppl (BAYER BREEZE 2 SYSTEM) W/DEVICE KIT by Does not apply route.  3 kit  0   ??? BAYER MICROLET LANCETS MISC by Does not apply route.  100 each  1   ??? meloxicam (MOBIC) 15 MG tablet Take 1 tablet by mouth daily for 30 days.  30 tablet  3   ??? ibuprofen (ADVIL;MOTRIN) 600 MG tablet        ??? aspirin (ASPIRIN CHILDRENS) 81 MG chewable tablet Take 1 tablet by mouth daily.  90 tablet  3   ??? Glucose Blood (BAYER  BREEZE 2 TEST VI) by In Vitro route.           No current facility-administered medications on file prior to visit.     BP 140/80   Pulse 74   Temp(Src) 98 ??F (36.7 ??C) (Tympanic)   Ht 5' 6.5" (1.689 m)   Wt 157 lb (71.215 kg)   BMI 24.96 kg/m2       HPI  Treatment Adherence:   Medication compliance:  compliant all of the time  Diet compliance:  compliant most of the time  Weight trend: stable      Diabetes Mellitus Type 2: Current symptoms/problems include none.    Home blood sugar records:  patient tests 1 time(s) per day  Any episodes of hypoglycemia? no  Eye exam current (within one year): yes  Tobacco history: She  reports that she has never smoked. She has never used smokeless tobacco.   Daily Aspirin? Yes  Known diabetic complications: none    Hypertension:  Home blood pressure monitoring: Yes.  She is adherent to a low sodium diet. Patient denies chest pain.  Antihypertensive medication side effects: no medication side effects noted.  Use of agents associated with hypertension: none. atorvastatin (Lipitor)    Hyperlipidemia:  No new myalgias or GI upset on .       Lab Results   Component Value Date    LABA1C 9.4* 12/06/2012    LABA1C 8.7* 07/31/2012    LABA1C 8.7* 03/16/2012     Lab Results   Component Value Date    LABMICR 0.6 03/16/2012    CREATININE 0.65 12/06/2012     Lab Results   Component Value Date    ALT 41* 12/06/2012    AST 27 12/06/2012     Lab Results   Component Value Date    CHOL 131 12/06/2012    TRIG 146 12/06/2012    HDL 49 12/06/2012    LDLCALC 53 12/06/2012            Review of Systems    FEMALE ROS:    REVIEW OF SYSTEMS:  The patient reports no problems with hearing or headaches.  She reports no problems with vision. She is advised to have a yearly eye examination. She has no chest pains or pressures, or shortness of breath.  She has no indigestion, heartburn, nausea, or vomiting.   She has no constipation, diarrhea, or  black, bloody, mucousy, or tarry stool.  She has no trouble passing urine or  losing urine.  Libido seems to be normal.  She reports no musculoskeletal aches or pains.  No paresthesias or loss of strength.        Objective:   Physical Exam      PHYSICAL EXAMINATION:   VITAL SIGNS: are as recorded.  GENERAL:  The patient appears well nourished and well-developed, and with normal affect.  No acute respiratory distress. Alert and oriented times 3. No skin rashes.     HEENT:  TMs normal bilaterally.  Canals and ears normal. Pharynx is clear. Extraocular eye motions intact and pain free.   Pupils reactive and equally round.  Sclerae and conjunctivae clear, normocephalic and atraumatic.      RESPIRATORY:  Clear and equal breath sounds with no acute respiratory distress.       HEART: Regular rhythm without murmur, rub or gallop.     ABDOMEN:  soft, nontender. No masses, guarding or rebound. Normoactive bowel sounds.     NECK: No masses or adenopathy palpable.  No bruits heard. No asymmetry visible.     COMPLETE EXTREMITIES: No edema, all four extremities with posterior tibial and radial pulses strong and palpable.     EXTREMITIES:  no edema in any extremity. No cyanosis or clubbing.    LOW BACK: No tenderness to palpation of inferior lumbar spine or either sacroiliac joint area.       Assessment:      Encounter Diagnoses   Name Primary?   ??? Type II or unspecified type diabetes mellitus without mention of complication, not stated as uncontrolled Yes   ??? Hypertension    ??? GERD (gastroesophageal reflux disease)    ??? Other screening mammogram  No health maintenance topics applied.         Plan:      Orders Placed This Encounter   Procedures   ??? MAM Digital Screen Bilateral     Orders Placed This Encounter   Medications   ??? dapagliflozin (FARXIGA) 5 MG tablet     Sig: Take 1 tablet by mouth every morning.     Dispense:  42 tablet     Refill:  0     Return in about 4 weeks (around 02/02/2013).         Symptoms and complaints should be improving over 48 hours and entirely resolved in two weeks, if not,  patient should call the office for a follow-up appointment.    See pv    BP next

## 2013-01-05 NOTE — Care Coordination-Inpatient (Signed)
ARNA LUIS  01/05/2013                Nurse Care Coordinator Progress Note    Assessment: Autumne is a 72 y.o. female.  Visited [place] prior to her visit with Dr. Alphia Moh.    Goals     ??? Blood sugars BID, log and bring to office.       ??? HEMOGLOBIN A1C < 7.0      ??? LDL CALC < 130      ??? Omit white flour pasta, rice and bread.      ??? Portion control         Barriers to meeting goals: lack of motivation    Medication Reconciliation performed: Yes    Patient Action:   Spoke with Royce regarding her AIC increasing to 9.4 from 8.7. NCC discussed whether or not she wanted to cont care coordination as she is non-compliant with diet. Lynnsey does want to continue with care coordination. She admits to eating white bread 3x daily, white pasta several times weekly and white rice. She admits how difficult it is. She does eat baked goods. She states she never eats fast foods.She states she has begun drinking plenty of water daily. Discussed healthy diet again and importance of compliance. Discussed importance of exercise. She states she does have exercise machines at home and she does use them. Discussed her motivation. Support given to try as she is only hurting herself. She states she feels good so she does not realize damage that can be done. Patient is taking her medication as ordered.  Cont.with goals.     Discussed salt in diet. Patient states she does use salt with cooking or she cannot eat it. Discussed need to reduce/ omit salt and why.      Plan of Care:    Communicate with patient monthly via telephone or office visit.     Support, motivate and educate patient as needed.

## 2013-01-18 NOTE — Telephone Encounter (Signed)
Notifies patient about mammogram results. Patient wants to hold off on biopsy of breast mass and wants to do another mammogram at [facility] for 2nd opinion.

## 2013-01-24 NOTE — Telephone Encounter (Signed)
Wants you to call her at [phone]

## 2013-01-27 ENCOUNTER — Encounter

## 2013-01-30 ENCOUNTER — Encounter

## 2013-02-02 ENCOUNTER — Encounter

## 2013-02-02 MED ORDER — METFORMIN HCL ER 500 MG PO TB24
500 MG | ORAL_TABLET | ORAL | Status: DC
Start: 2013-02-02 — End: 2013-04-09

## 2013-02-02 MED ORDER — VALSARTAN 80 MG PO TABS
80 MG | ORAL_TABLET | Freq: Every day | ORAL | Status: DC
Start: 2013-02-02 — End: 2013-04-09

## 2013-02-02 MED ORDER — ATORVASTATIN CALCIUM 10 MG PO TABS
10 MG | ORAL_TABLET | ORAL | Status: DC
Start: 2013-02-02 — End: 2013-04-09

## 2013-02-02 MED ORDER — ESOMEPRAZOLE MAGNESIUM 40 MG PO CPDR
40 MG | ORAL_CAPSULE | ORAL | Status: DC
Start: 2013-02-02 — End: 2013-04-09

## 2013-02-02 MED ORDER — ALENDRONATE SODIUM 70 MG PO TABS
70 MG | ORAL_TABLET | ORAL | Status: DC
Start: 2013-02-02 — End: 2013-04-09

## 2013-02-02 MED ORDER — SITAGLIPTIN PHOSPHATE 100 MG PO TABS
100 MG | ORAL_TABLET | ORAL | Status: DC
Start: 2013-02-02 — End: 2013-04-09

## 2013-02-02 MED ORDER — GLIMEPIRIDE 4 MG PO TABS
4 MG | ORAL_TABLET | ORAL | Status: DC
Start: 2013-02-02 — End: 2013-04-09

## 2013-02-02 MED ORDER — AMLODIPINE BESYLATE 10 MG PO TABS
10 MG | ORAL_TABLET | ORAL | Status: DC
Start: 2013-02-02 — End: 2013-06-06

## 2013-02-02 NOTE — Progress Notes (Signed)
Subjective:      Patient ID: Lori Chase is a 72 y.o. female.     Chief Complaint   Patient presents with   ??? Diabetes     Patient is here for a follow up on her diabetes     Past Medical History   Diagnosis Date   ??? Type II or unspecified type diabetes mellitus without mention of complication, not stated as uncontrolled    ??? Anxiety    ??? Hypertension    ??? Cancer (HCC)      breast   ??? 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 Right knee pain 01/07/2012   ??? Colon polyp      Past Surgical History   Procedure Laterality Date   ??? Cholecystectomy     ??? Tonsillectomy  age 73   ??? Breast lumpectomy  2007   ??? Colonoscopy  09/18/12     [name]     Allergies   Allergen Reactions   ??? Morphine    ??? Oxycontin [Oxycodone]      History     Social History   ??? Marital Status: Married     Spouse Name: N/A     Number of Children: N/A   ??? Years of Education: N/A     Occupational History   ??? Not on file.     Social History Main Topics   ??? Smoking status: Never Smoker    ??? Smokeless tobacco: Never Used   ??? Alcohol Use: Yes      Comment: occ wine   ??? Drug Use: No   ??? Sexual Activity: Not on file     Other Topics Concern   ??? Not on file     Social History Narrative     Family History   Problem Relation Age of Onset   ??? High Blood Pressure Mother    ??? High Blood Pressure Father      Current Outpatient Prescriptions on File Prior to Visit   Medication Sig Dispense Refill   ??? diclofenac (VOLTAREN) 75 MG EC tablet Take 1 tablet by mouth 2 times daily (with meals).  60 tablet  1   ??? Blood Glucose Monitoring Suppl (BAYER BREEZE 2 SYSTEM) W/DEVICE KIT by Does not apply route.  3 kit  0   ??? BAYER MICROLET LANCETS MISC by Does not apply route.  100 each  1   ??? ibuprofen (ADVIL;MOTRIN) 600 MG tablet        ??? aspirin (ASPIRIN CHILDRENS) 81 MG chewable tablet Take 1 tablet by mouth daily.  90 tablet  3   ??? Glucose Blood (BAYER BREEZE 2 TEST VI) by In Vitro route.         ??? meloxicam (MOBIC) 15 MG tablet Take 1 tablet by mouth daily for 30 days.   30 tablet  3     No current facility-administered medications on file prior to visit.     BP 150/80   Pulse 68   Temp(Src) 97.9 ??F (36.6 ??C)   Resp 12   Ht 5' 6.5" (1.689 m)   Wt 156 lb (70.761 kg)   BMI 24.8 kg/m2      Diabetes  She presents for her follow-up diabetic visit. She has type 2 diabetes mellitus. Pertinent negatives for hypoglycemia include no confusion, dizziness or headaches. Pertinent negatives for diabetes include no blurred vision, no chest pain, no fatigue and no foot ulcerations. Risk factors for coronary artery disease include diabetes mellitus,  dyslipidemia, hypertension and sedentary lifestyle. Current diabetic treatment includes oral agent (triple therapy). She is compliant with treatment most of the time. She has not had a previous visit with a dietitian. She never participates in exercise. She does not see a podiatrist.Eye exam is current.     Treatment Adherence:   Medication compliance:  compliant most of the time  Diet compliance:  compliant most of the time  Weight trend: stable  Current exercise: no regular exercise      Diabetes Mellitus Type 2: Current symptoms/problems include none.    Home blood sugar records:  patient tests 1 time(s) per day  Any episodes of hypoglycemia? no  Eye exam current (within one year): yes  Tobacco history: She  reports that she has never smoked. She has never used smokeless tobacco.   Daily Aspirin? Yes  Known diabetic complications: none    Hypertension:  Home blood pressure monitoring: No.  She is adherent to a low sodium diet. Patient denies chest pain, shortness of breath, headache, lightheadedness, blurred vision, peripheral edema, palpitations, dry cough and fatigue.  Antihypertensive medication side effects: no medication side effects noted.  Use of agents associated with hypertension: none.     Hyperlipidemia:  No new myalgias or GI upset on atorvastatin (Lipitor).       Lab Results   Component Value Date    LABA1C 9.4* 12/06/2012    LABA1C 8.7*  07/31/2012    LABA1C 8.7* 03/16/2012     Lab Results   Component Value Date    LABMICR 0.6 03/16/2012    CREATININE 0.65 12/06/2012     Lab Results   Component Value Date    ALT 41* 12/06/2012    AST 27 12/06/2012     Lab Results   Component Value Date    CHOL 131 12/06/2012    TRIG 146 12/06/2012    HDL 49 12/06/2012    LDLCALC 53 12/06/2012            Review of Systems   Constitutional: Negative for fatigue.   Eyes: Negative for blurred vision.   Cardiovascular: Negative for chest pain.   Neurological: Negative for dizziness and headaches.   Psychiatric/Behavioral: Negative for confusion.       Objective:   Physical Exam      PHYSICAL EXAMINATION:   VITAL SIGNS: are as recorded.  GENERAL:  The patient appears well nourished and well-developed, and with normal affect.  No acute respiratory distress. Alert and oriented times 3. No skin rashes.     HEENT:  TMs normal bilaterally.  Canals and ears normal. Pharynx is clear. Extraocular eye motions intact and pain free.   Pupils reactive and equally round.  Sclerae and conjunctivae clear, normocephalic and atraumatic.      RESPIRATORY:  Clear and equal breath sounds with no acute respiratory distress.       HEART: Regular rhythm without murmur, rub or gallop.     ABDOMEN:  soft, nontender. No masses, guarding or rebound. Normoactive bowel sounds.     NECK: No masses or adenopathy palpable.  No bruits heard. No asymmetry visible.     COMPLETE EXTREMITIES: No edema, all four extremities with posterior tibial and radial pulses strong and palpable.     EXTREMITIES:  no edema in any extremity. No cyanosis or clubbing.    LOW BACK: No tenderness to palpation of inferior lumbar spine or either sacroiliac joint area.       Assessment:  Encounter Diagnoses   Name Primary?   ??? Type II or unspecified type diabetes mellitus without mention of complication, not stated as uncontrolled    ??? Hypertension    ??? OA (osteoarthritis)    ??? Osteopenia    ??? GERD (gastroesophageal reflux disease)     ??? Cancer (HCC)    ??? Abnormal mammogram with microcalcification Yes     No health maintenance topics applied.         Plan:      Orders Placed This Encounter   Medications   ??? sitaGLIPtin (JANUVIA) 100 MG tablet     Sig: TAKE 1 TABLET BY MOUTH EVERY DAY.     Dispense:  90 tablet     Refill:  1   ??? esomeprazole (NEXIUM) 40 MG capsule     Sig: TAKE 1 CAPSULE BY MOUTH EVERY MORNING (BEFORE BREAKFAST).     Dispense:  90 capsule     Refill:  1   ??? valsartan (DIOVAN) 80 MG tablet     Sig: Take 1 tablet by mouth daily.     Dispense:  90 tablet     Refill:  1   ??? metFORMIN ER (GLUCOPHAGE-XR) 500 MG XR tablet     Sig: TAKE 1 TABLET BY MOUTH 4 TIMES DAILY.     Dispense:  360 tablet     Refill:  1   ??? alendronate (FOSAMAX) 70 MG tablet     Sig: TAKE 1 TABLET BY MOUTH ONCE A WEEK     Dispense:  12 tablet     Refill:  1   ??? glimepiride (AMARYL) 4 MG tablet     Sig: TAKE 2 TABLETS BY MOUTH EVERY DAY     Dispense:  180 tablet     Refill:  1   ??? atorvastatin (LIPITOR) 10 MG tablet     Sig: TAKE 1 TABLET BY MOUTH EVERY DAY.     Dispense:  90 tablet     Refill:  1   ??? amLODIPine (NORVASC) 10 MG tablet     Sig: TAKE 1 TABLET BY MOUTH EVERY DAY.     Dispense:  90 tablet     Refill:  1     No orders of the defined types were placed in this encounter.     Diabetes Counseling   Patient was again counseled regarding disease risks and need to adopt and maintain healthy behaviors, and was provided education materials to assist with self management. These included logs to record blood pressure, caloric intake and/or blood sugar (some given at previous visits). Patient was instructed to keep logs up-to-date and to always bring logs to all office visits.    Return in about 4 weeks (around 03/02/2013).        Symptoms and complaints should be improving over 48 hours and entirely resolved in two weeks, if not, patient should call the office for a follow-up appointment.    See pv    Seeing Dr. Shawnie Pons @ [facility]

## 2013-02-02 NOTE — Care Coordination-Inpatient (Signed)
MAURIANA DANN  02/02/2013                Nurse Care Coordinator Progress Note    Assessment: Midge is a 72 y.o. female.  Visited with Rebeca at [facility] visit with Dr. Alphia Moh    Goals     ??? Blood sugars BID, log and bring to office.       ??? HEMOGLOBIN A1C < 7.0      ??? LDL CALC < 130      ??? Omit white flour pasta, rice and bread.      ??? Portion control         Barriers to meeting goals: lack of motivation and stress    Medication Reconciliation performed: Yes    Patient Action: Patient discussed her abnormal mammogram. She will be following up with Dr. Shawnie Pons at [facility] for bx probably next week. H/o breast CA. Patient states she has dramatically cit down on her portions. She stopped eating bread except occasional rye bread. She did not log her blood sugars as requested. Long discussion regarding stress with care of husband. Cont.with goals.       Plan of Care:     Communicate with patient monthly via telephone or office visit.    Support, motivate and educate patient as needed.

## 2013-02-16 MED ORDER — DAPAGLIFLOZIN PROPANEDIOL 5 MG PO TABS
5 MG | ORAL_TABLET | Freq: Every morning | ORAL | Status: DC
Start: 2013-02-16 — End: 2013-04-03

## 2013-03-08 NOTE — Care Coordination-Inpatient (Signed)
Called Lori Chase / husband answered/ left message to return my call for monthly progress.

## 2013-03-09 NOTE — Care Coordination-Inpatient (Signed)
Lori Chase  03/09/2013                Nurse Care Coordinator Progress Note    Assessment: Lori Chase is a 72 y.o. female. Lori Chase returned call for monthly progress.    Goals     ??? Blood sugars BID, log and bring to office.       ??? HEMOGLOBIN A1C < 7.0      ??? LDL CALC < 130      ??? Omit white flour pasta, rice and bread.      ??? Portion control         Barriers to meeting goals: lack of motivation and stress    Medication Reconciliation performed: Yes    Patient Action: Lori Chase states her breast bx was negative and all is well. She states she tries to participate with portion control and omitting white flour pasta and bread but it is difficult. She states she feels cont stress with husband. Cont goals.       Plan of Care:    Communicate with patient monthly via telephone or office visit.    Support, motivate, and educate patient as needed.

## 2013-04-05 MED ORDER — DAPAGLIFLOZIN PROPANEDIOL 5 MG PO TABS
5 MG | ORAL_TABLET | Freq: Every morning | ORAL | Status: DC
Start: 2013-04-05 — End: 2013-04-09

## 2013-04-09 MED ORDER — GLIMEPIRIDE 4 MG PO TABS
4 MG | ORAL_TABLET | ORAL | Status: DC
Start: 2013-04-09 — End: 2013-06-06

## 2013-04-09 MED ORDER — SITAGLIPTIN PHOSPHATE 100 MG PO TABS
100 MG | ORAL_TABLET | ORAL | Status: DC
Start: 2013-04-09 — End: 2013-06-06

## 2013-04-09 MED ORDER — METFORMIN HCL ER 500 MG PO TB24
500 MG | ORAL_TABLET | ORAL | Status: DC
Start: 2013-04-09 — End: 2013-06-06

## 2013-04-09 MED ORDER — DAPAGLIFLOZIN PROPANEDIOL 5 MG PO TABS
5 MG | ORAL_TABLET | Freq: Every morning | ORAL | Status: DC
Start: 2013-04-09 — End: 2013-06-06

## 2013-04-09 MED ORDER — BAYER MICROLET LANCETS MISC
Status: DC
Start: 2013-04-09 — End: 2013-06-06

## 2013-04-09 MED ORDER — ESOMEPRAZOLE MAGNESIUM 40 MG PO CPDR
40 MG | ORAL_CAPSULE | ORAL | Status: DC
Start: 2013-04-09 — End: 2013-06-06

## 2013-04-09 MED ORDER — ATORVASTATIN CALCIUM 10 MG PO TABS
10 MG | ORAL_TABLET | ORAL | Status: DC
Start: 2013-04-09 — End: 2013-06-06

## 2013-04-09 MED ORDER — VALSARTAN 80 MG PO TABS
80 MG | ORAL_TABLET | Freq: Every day | ORAL | Status: DC
Start: 2013-04-09 — End: 2013-06-06

## 2013-04-09 MED ORDER — ALENDRONATE SODIUM 70 MG PO TABS
70 MG | ORAL_TABLET | ORAL | Status: DC
Start: 2013-04-09 — End: 2013-06-06

## 2013-04-09 NOTE — Progress Notes (Signed)
Subjective:      Patient ID: Lori Chase is a 72 y.o. female.     Chief Complaint   Patient presents with   ??? Diabetes     Past Medical History   Diagnosis Date   ??? Type II or unspecified type diabetes mellitus without mention of complication, not stated as uncontrolled (HCC)    ??? Anxiety    ??? Hypertension    ??? Cancer (HCC)      breast   ??? 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 Right knee pain 01/07/2012   ??? Colon polyp      Past Surgical History   Procedure Laterality Date   ??? Cholecystectomy     ??? Tonsillectomy  age 78   ??? Breast lumpectomy  2007   ??? Colonoscopy  09/18/12     [name]     Allergies   Allergen Reactions   ??? Morphine    ??? Oxycontin [Oxycodone]      History     Social History   ??? Marital Status: Married     Spouse Name: N/A     Number of Children: N/A   ??? Years of Education: N/A     Occupational History   ??? Not on file.     Social History Main Topics   ??? Smoking status: Never Smoker    ??? Smokeless tobacco: Never Used   ??? Alcohol Use: Yes      Comment: occ wine   ??? Drug Use: No   ??? Sexual Activity: Not on file     Other Topics Concern   ??? Not on file     Social History Narrative     Family History   Problem Relation Age of Onset   ??? High Blood Pressure Mother    ??? High Blood Pressure Father      Current Outpatient Prescriptions on File Prior to Visit   Medication Sig Dispense Refill   ??? amLODIPine (NORVASC) 10 MG tablet TAKE 1 TABLET BY MOUTH EVERY DAY.  90 tablet  1   ??? Blood Glucose Monitoring Suppl (BAYER BREEZE 2 SYSTEM) W/DEVICE KIT by Does not apply route.  3 kit  0   ??? aspirin (ASPIRIN CHILDRENS) 81 MG chewable tablet Take 1 tablet by mouth daily.  90 tablet  3     No current facility-administered medications on file prior to visit.     BP 112/70   Pulse 68   Temp(Src) 97.6 ??F (36.4 ??C)   Resp 12   Ht 5' 6.5" (1.689 m)   Wt 154 lb (69.854 kg)   BMI 24.49 kg/m2      Diabetes  She presents for her follow-up diabetic visit. She has type 2 diabetes mellitus. Pertinent negatives for  hypoglycemia include no confusion, dizziness or headaches. Pertinent negatives for diabetes include no blurred vision, no chest pain and no fatigue. Risk factors for coronary artery disease include dyslipidemia, sedentary lifestyle and post-menopausal. Current diabetic treatment includes oral agent (triple therapy). She is compliant with treatment all of the time. She has not had a previous visit with a dietitian. She does not see a podiatrist.Eye exam is current.       Review of Systems   Constitutional: Negative for fatigue.   Eyes: Negative for blurred vision.   Cardiovascular: Negative for chest pain.   Neurological: Negative for dizziness and headaches.   Psychiatric/Behavioral: Negative for confusion.     Treatment Adherence:   Medication compliance:  compliant all of the time  Diet compliance:  compliant most of the time  Weight trend: stable  Current exercise: no regular exercise      Diabetes Mellitus Type 2: Current symptoms/problems include none.    Home blood sugar records:  patient tests 1 time(s) per day  Any episodes of hypoglycemia? no  Eye exam current (within one year): yes  Tobacco history: She  reports that she has never smoked. She has never used smokeless tobacco.   Daily Aspirin? No:   Known diabetic complications: none    Hypertension:  Home blood pressure monitoring: No.  She is adherent to a low sodium diet. Patient denies chest pain, shortness of breath, headache, lightheadedness, blurred vision, peripheral edema, palpitations, dry cough and fatigue.  Antihypertensive medication side effects: no medication side effects noted.  Use of agents associated with hypertension: none.     Hyperlipidemia:  No new myalgias or GI upset on atorvastatin (Lipitor).       Lab Results   Component Value Date    LABA1C 9.4* 12/06/2012    LABA1C 8.7* 07/31/2012    LABA1C 8.7* 03/16/2012     Lab Results   Component Value Date    LABMICR 0.6 03/16/2012    CREATININE 0.65 12/06/2012     Lab Results   Component  Value Date    ALT 41* 12/06/2012    AST 27 12/06/2012     Lab Results   Component Value Date    CHOL 131 12/06/2012    TRIG 146 12/06/2012    HDL 49 12/06/2012    LDLCALC 53 12/06/2012            Objective:   Physical Exam      PHYSICAL EXAMINATION:   VITAL SIGNS: are as recorded.  GENERAL:  The patient appears well nourished and well-developed, and with normal affect.  No acute respiratory distress. Alert and oriented times 3. No skin rashes.     HEENT:  TMs normal bilaterally.  Canals and ears normal. Pharynx is clear. Extraocular eye motions intact and pain free.   Pupils reactive and equally round.  Sclerae and conjunctivae clear, normocephalic and atraumatic.      RESPIRATORY:  Clear and equal breath sounds with no acute respiratory distress.       HEART: Regular rhythm without murmur, rub or gallop.     ABDOMEN:  soft, nontender. No masses, guarding or rebound. Normoactive bowel sounds.     NECK: No masses or adenopathy palpable.  No bruits heard. No asymmetry visible.     COMPLETE EXTREMITIES: No edema, all four extremities with posterior tibial and radial pulses strong and palpable.     EXTREMITIES:  no edema in any extremity. No cyanosis or clubbing.    LOW BACK: No tenderness to palpation of inferior lumbar spine or either sacroiliac joint area.       Assessment:      Encounter Diagnoses   Name Primary?   ??? Need for prophylactic vaccination and inoculation against influenza Yes   ??? Type II or unspecified type diabetes mellitus without mention of complication, not stated as uncontrolled (HCC)    ??? Hypertension    ??? OA (osteoarthritis)    ??? Osteopenia    ??? GERD (gastroesophageal reflux disease)    ??? Cancer (HCC)    ??? Breast cancer, left (HCC)    ??? Other malaise and fatigue      Health Maintenance Due   Topic Date Due   ???  TETANUS VACCINE ADULT (11 YEARS AND UP)  05/24/2012   ??? FLU VACCINE YEARLY (ADULT)  01/22/2013   ??? FOOT EXAM  03/16/2013   ??? MICROALBUMINURIA  03/16/2013   ??? DEPRESSION SCREENING  04/26/2013             Plan:      Orders Placed This Encounter   Medications   ??? sitaGLIPtin (JANUVIA) 100 MG tablet     Sig: TAKE 1 TABLET BY MOUTH EVERY DAY.     Dispense:  90 tablet     Refill:  1   ??? esomeprazole (NEXIUM) 40 MG capsule     Sig: TAKE 1 CAPSULE BY MOUTH EVERY MORNING (BEFORE BREAKFAST).     Dispense:  90 capsule     Refill:  1   ??? valsartan (DIOVAN) 80 MG tablet     Sig: Take 1 tablet by mouth daily.     Dispense:  90 tablet     Refill:  1   ??? metFORMIN ER (GLUCOPHAGE-XR) 500 MG XR tablet     Sig: TAKE 1 TABLET BY MOUTH 4 TIMES DAILY.     Dispense:  360 tablet     Refill:  1   ??? alendronate (FOSAMAX) 70 MG tablet     Sig: TAKE 1 TABLET BY MOUTH ONCE A WEEK     Dispense:  12 tablet     Refill:  1   ??? glimepiride (AMARYL) 4 MG tablet     Sig: TAKE 2 TABLETS BY MOUTH EVERY DAY     Dispense:  180 tablet     Refill:  1   ??? atorvastatin (LIPITOR) 10 MG tablet     Sig: TAKE 1 TABLET BY MOUTH EVERY DAY.     Dispense:  90 tablet     Refill:  1   ??? dapagliflozin (FARXIGA) 5 MG tablet     Sig: Take 1 tablet by mouth every morning.     Dispense:  90 tablet     Refill:  1   ??? BAYER MICROLET LANCETS MISC     Sig: Once daily     Dispense:  100 each     Refill:  1     Dx 250.00     Orders Placed This Encounter   Procedures   ??? FLU VACCINE => 3 YEARS IM QUADRIVALENT   ??? CBC Auto Differential   ??? Comprehensive Metabolic Panel   ??? Lipid Panel     Order Specific Question:  Is Patient Fasting?/# of Hours     Answer:  yes   ??? Hemoglobin A1C   ??? TSH without Reflex   ??? Microalbumin / Creatinine Urine Ratio   ??? HM DIABETES EYE EXAM   ??? HM DIABETES FOOT EXAM     Return in about 4 weeks (around 05/07/2013).         Check sugars qd    Symptoms and complaints should be improving over 48 hours and entirely resolved in two weeks, if not, patient should call the office for a follow-up appointment.    See pv      DIABETIC FOOT EXAM:    Dorsalis pedis and posterior tibial pulses are symmetric.  No fissures between the toes.  No open sores on the  feet.  Sensation intact .

## 2013-04-10 LAB — CBC WITH DIFFERENTIAL
Basophils %: 0.6 %
Basophils Absolute: 0.1 10*3/uL (ref 0.0–0.2)
Eosinophils %: 2.1 %
Eosinophils Absolute: 0.3 10*3/uL (ref 0.0–0.7)
Hematocrit: 39.5 % (ref 37.0–47.0)
Hemoglobin: 12.2 g/dL (ref 12.0–16.0)
Lymphocytes %: 21.1 %
Lymphocytes Absolute: 3 10*3/uL (ref 1.0–4.8)
MCH: 24.6 pg — ABNORMAL LOW (ref 27.0–31.3)
MCHC: 31 % — ABNORMAL LOW (ref 33.0–37.0)
MCV: 79.5 fL — ABNORMAL LOW (ref 82.0–100.0)
MPV: 11.6 fL — ABNORMAL HIGH (ref 7.4–10.4)
Monocytes %: 8.3 %
Monocytes Absolute: 1.2 10*3/uL — ABNORMAL HIGH (ref 0.2–0.8)
Neutrophils %: 67.9 %
Neutrophils Absolute: 9.8 10*3/uL — ABNORMAL HIGH (ref 1.4–6.5)
Platelets: 301 10*3/uL (ref 130–400)
RBC: 4.96 M/uL (ref 4.20–5.40)
RDW: 17.1 % — ABNORMAL HIGH (ref 11.5–14.5)
WBC: 14.5 10*3/uL — ABNORMAL HIGH (ref 4.8–10.8)

## 2013-04-10 LAB — COMPREHENSIVE METABOLIC PANEL
ALT: 44 U/L — ABNORMAL HIGH (ref 0–33)
AST: 30 U/L (ref 0–35)
Albumin: 4.6 g/dL (ref 3.9–4.9)
Alkaline Phosphatase: 65 U/L (ref 40–130)
Anion Gap: 14 mEq/L — ABNORMAL HIGH (ref 7–13)
BUN: 17 mg/dL (ref 8–23)
CO2: 25 mEq/L (ref 22–29)
Calcium: 10.8 mg/dL — ABNORMAL HIGH (ref 8.6–10.2)
Chloride: 95 mEq/L — ABNORMAL LOW (ref 98–107)
Creatinine: 0.66 mg/dL (ref 0.50–0.90)
GFR African American: >60 (ref >60)
GFR Non-African American: >60 (ref >60)
Globulin: 3.3 g/dL (ref 2.3–3.5)
Glucose: 145 mg/dL — ABNORMAL HIGH (ref 74–109)
Potassium: 4.7 mEq/L (ref 3.5–5.1)
Sodium: 134 mEq/L (ref 132–144)
Total Bilirubin: 0.2 mg/dL (ref 0.0–1.2)
Total Protein: 7.9 g/dL (ref 6.4–8.1)

## 2013-04-10 LAB — MICROALBUMIN / CREATININE URINE RATIO
Creatinine, Ur: 27.3 mg/dL
Microalbumin Creatinine Ratio: 44 mg/G — ABNORMAL HIGH (ref 0.0–30.0)
Microalbumin, Random Urine: 1.2 mg/dL

## 2013-04-10 LAB — TSH, HIGH SENSITIVE: TSH: 0.957 u[IU]/mL (ref 0.270–4.200)

## 2013-04-10 LAB — HEMOGLOBIN A1C: Hemoglobin A1C: 8.3 % — ABNORMAL HIGH (ref 4.8–5.9)

## 2013-04-10 NOTE — Care Coordination-Inpatient (Signed)
Called [name] for for monthly progress and inform her of improved AIC from 9.4 to 8.3/ left message to return my call.

## 2013-04-11 LAB — LIPID PANEL
Cholesterol, Total: 148 mg/dL (ref 0–199)
HDL: 53 mg/dL (ref 40–59)
LDL Calculated: 63 mg/dL (ref 0–129)
Triglycerides: 158 mg/dL (ref 0–200)

## 2013-05-09 NOTE — Progress Notes (Signed)
Chief Complaint   Patient presents with   ??? Diabetes     1 month follow up   ??? Blood Work     Past Medical History   Diagnosis Date   ??? Type II or unspecified type diabetes mellitus without mention of complication, not stated as uncontrolled (HCC)    ??? Anxiety    ??? Hypertension    ??? Cancer (HCC)      breast   ??? 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 Right knee pain 01/07/2012   ??? Colon polyp      Past Surgical History   Procedure Laterality Date   ??? Cholecystectomy     ??? Tonsillectomy  age 65   ??? Breast lumpectomy  2007   ??? Colonoscopy  09/18/12     [name]     Allergies   Allergen Reactions   ??? Morphine    ??? Oxycontin [Oxycodone]      History     Social History   ??? Marital Status: Married     Spouse Name: N/A     Number of Children: N/A   ??? Years of Education: N/A     Occupational History   ??? Not on file.     Social History Main Topics   ??? Smoking status: Never Smoker    ??? Smokeless tobacco: Never Used   ??? Alcohol Use: Yes      Comment: occ wine   ??? Drug Use: No   ??? Sexual Activity: Not on file     Other Topics Concern   ??? Not on file     Social History Narrative     Family History   Problem Relation Age of Onset   ??? High Blood Pressure Mother    ??? High Blood Pressure Father      Current Outpatient Prescriptions on File Prior to Visit   Medication Sig Dispense Refill   ??? sitaGLIPtin (JANUVIA) 100 MG tablet TAKE 1 TABLET BY MOUTH EVERY DAY.  90 tablet  1   ??? esomeprazole (NEXIUM) 40 MG capsule TAKE 1 CAPSULE BY MOUTH EVERY MORNING (BEFORE BREAKFAST).  90 capsule  1   ??? valsartan (DIOVAN) 80 MG tablet Take 1 tablet by mouth daily.  90 tablet  1   ??? metFORMIN ER (GLUCOPHAGE-XR) 500 MG XR tablet TAKE 1 TABLET BY MOUTH 4 TIMES DAILY.  360 tablet  1   ??? alendronate (FOSAMAX) 70 MG tablet TAKE 1 TABLET BY MOUTH ONCE A WEEK  12 tablet  1   ??? glimepiride (AMARYL) 4 MG tablet TAKE 2 TABLETS BY MOUTH EVERY DAY  180 tablet  1   ??? atorvastatin (LIPITOR) 10 MG tablet TAKE 1 TABLET BY MOUTH EVERY DAY.  90 tablet  1   ???  dapagliflozin (FARXIGA) 5 MG tablet Take 1 tablet by mouth every morning.  90 tablet  1   ??? BAYER MICROLET LANCETS MISC Once daily  100 each  1   ??? amLODIPine (NORVASC) 10 MG tablet TAKE 1 TABLET BY MOUTH EVERY DAY.  90 tablet  1   ??? Blood Glucose Monitoring Suppl (BAYER BREEZE 2 SYSTEM) W/DEVICE KIT by Does not apply route.  3 kit  0   ??? aspirin (ASPIRIN CHILDRENS) 81 MG chewable tablet Take 1 tablet by mouth daily.  90 tablet  3     No current facility-administered medications on file prior to visit.           Subjective  Lori  E Chase, 72 y.o. female  HPI Comments: Patient in today to gt blood work done to have her sugars checked, also in to follow up on diabetes, she comes once a month.    Diabetes  She presents for her follow-up diabetic visit. Her disease course has been stable. Hypoglycemia symptoms include sleepiness. Pertinent negatives for hypoglycemia include no confusion, dizziness, headaches, hunger, mood changes, nervousness/anxiousness, seizures, speech difficulty, sweats or tremors. Pertinent negatives for diabetes include no blurred vision, no chest pain, no fatigue, no foot ulcerations, no visual change, no weakness and no weight loss. Symptoms are stable.         Review of Systems   Constitutional: Negative for weight loss and fatigue.   Eyes: Negative for blurred vision.   Cardiovascular: Negative for chest pain.   Neurological: Negative for dizziness, tremors, seizures, speech difficulty, weakness and headaches.   Psychiatric/Behavioral: Negative for confusion. The patient is not nervous/anxious.            Objective    Filed Vitals:    05/09/13 1359   BP: 138/72   Pulse: 72   Temp: 97.9 ??F (36.6 ??C)   TempSrc: Oral   Resp: 20   Height: 5' 6.5" (1.689 m)   Weight: 153 lb (69.4 kg)     Physical Exam      PHYSICAL EXAMINATION:   VITAL SIGNS: are as recorded.  GENERAL:  The patient appears well nourished and well-developed, and with normal affect.  No acute respiratory distress. Alert and  oriented times 3. No skin rashes.     HEENT:  TMs normal bilaterally.  Canals and ears normal. Pharynx is clear. Extraocular eye motions intact and pain free.   Pupils reactive and equally round.  Sclerae and conjunctivae clear, normocephalic and atraumatic.      RESPIRATORY:  Clear and equal breath sounds with no acute respiratory distress.       HEART: Regular rhythm without murmur, rub or gallop.     ABDOMEN:  soft, nontender. No masses, guarding or rebound. Normoactive bowel sounds.     NECK: No masses or adenopathy palpable.  No bruits heard. No asymmetry visible.     COMPLETE EXTREMITIES: No edema, all four extremities with posterior tibial and radial pulses strong and palpable.     EXTREMITIES:  no edema in any extremity. No cyanosis or clubbing.    LOW BACK: No tenderness to palpation of inferior lumbar spine or either sacroiliac joint area.     OA knee L      Assessment & Plan   1. Depression screening  Negative Screen for Clinical Depression, Follow-up not Required G8510   2. Type II or unspecified type diabetes mellitus without mention of complication, not stated as uncontrolled     3. Hypertension     4. GERD (gastroesophageal reflux disease)     5. OA (osteoarthritis)     6. OA (osteoarthritis) of knee  PR TRIAMCINOLONE ACETONIDE INJ    PR DRAIN/INJECT LARGE JOINT/BURSA     Orders Placed This Encounter   Procedures   ??? Negative Screen for Clinical Depression, Follow-up not Required G8510   ??? PR TRIAMCINOLONE ACETONIDE INJ     80 mg   ??? PR DRAIN/INJECT LARGE JOINT/BURSA     No orders of the defined types were placed in this encounter.     There are no discontinued medications.  No health maintenance topics applied.    Risks were explained.  Consent was obtained.  Joint was  injected with Kenalog & Lidocaine.  Pt tolerated procedure well.  There were no complications.L knee    See pv    PT & ice    See BW    Return in about 4 weeks (around 06/06/2013).    Norva Riffle, MD

## 2013-06-06 MED ORDER — ESOMEPRAZOLE MAGNESIUM 40 MG PO CPDR
40 MG | ORAL_CAPSULE | ORAL | Status: DC
Start: 2013-06-06 — End: 2013-09-05

## 2013-06-06 MED ORDER — BAYER MICROLET LANCETS MISC
Status: DC
Start: 2013-06-06 — End: 2013-09-05

## 2013-06-06 MED ORDER — DAPAGLIFLOZIN PROPANEDIOL 5 MG PO TABS
5 MG | ORAL_TABLET | Freq: Every morning | ORAL | Status: DC
Start: 2013-06-06 — End: 2013-08-10

## 2013-06-06 MED ORDER — VALSARTAN 80 MG PO TABS
80 MG | ORAL_TABLET | Freq: Every day | ORAL | Status: DC
Start: 2013-06-06 — End: 2013-09-05

## 2013-06-06 MED ORDER — GLIMEPIRIDE 4 MG PO TABS
4 MG | ORAL_TABLET | ORAL | Status: DC
Start: 2013-06-06 — End: 2013-09-05

## 2013-06-06 MED ORDER — SITAGLIPTIN PHOSPHATE 100 MG PO TABS
100 MG | ORAL_TABLET | ORAL | Status: DC
Start: 2013-06-06 — End: 2013-09-05

## 2013-06-06 MED ORDER — AMLODIPINE BESYLATE 10 MG PO TABS
10 MG | ORAL_TABLET | ORAL | Status: DC
Start: 2013-06-06 — End: 2013-09-05

## 2013-06-06 MED ORDER — PREDNISONE 10 MG PO TABS
10 MG | ORAL_TABLET | ORAL | Status: AC
Start: 2013-06-06 — End: 2013-06-20

## 2013-06-06 MED ORDER — ALENDRONATE SODIUM 70 MG PO TABS
70 MG | ORAL_TABLET | ORAL | Status: DC
Start: 2013-06-06 — End: 2013-09-05

## 2013-06-06 MED ORDER — ATORVASTATIN CALCIUM 10 MG PO TABS
10 MG | ORAL_TABLET | ORAL | Status: DC
Start: 2013-06-06 — End: 2013-09-05

## 2013-06-06 MED ORDER — METFORMIN HCL ER 500 MG PO TB24
500 MG | ORAL_TABLET | ORAL | Status: DC
Start: 2013-06-06 — End: 2013-09-05

## 2013-06-06 NOTE — Progress Notes (Signed)
Chief Complaint   Patient presents with   ??? Knee Pain     Past Medical History   Diagnosis Date   ??? Type II or unspecified type diabetes mellitus without mention of complication, not stated as uncontrolled ([place])    ??? Anxiety    ??? Hypertension    ??? Cancer ([place])      breast   ??? 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 Right knee pain 01/07/2012   ??? Colon polyp      Past Surgical History   Procedure Laterality Date   ??? Cholecystectomy     ??? Tonsillectomy  age 71   ??? Breast lumpectomy  2007   ??? Colonoscopy  09/18/12     [name]     Allergies   Allergen Reactions   ??? Morphine    ??? Oxycontin [Oxycodone]      History     Social History   ??? Marital Status: Married     Spouse Name: N/A     Number of Children: N/A   ??? Years of Education: N/A     Occupational History   ??? Not on file.     Social History Main Topics   ??? Smoking status: Never Smoker    ??? Smokeless tobacco: Never Used   ??? Alcohol Use: Yes      Comment: occ wine   ??? Drug Use: No   ??? Sexual Activity: Not on file     Other Topics Concern   ??? Not on file     Social History Narrative     Family History   Problem Relation Age of Onset   ??? High Blood Pressure Mother    ??? High Blood Pressure Father      Current Outpatient Prescriptions on File Prior to Visit   Medication Sig Dispense Refill   ??? Blood Glucose Monitoring Suppl (BAYER BREEZE 2 SYSTEM) W/DEVICE KIT by Does not apply route.  3 kit  0   ??? aspirin (ASPIRIN CHILDRENS) 81 MG chewable tablet Take 1 tablet by mouth daily.  90 tablet  3     No current facility-administered medications on file prior to visit.           Subjective  Lori Chase, 73 y.o. female  Knee Pain   The incident occurred more than 1 week ago. The pain is present in the left knee. The quality of the pain is described as shooting. The pain is at a severity of 6/10. The pain is moderate. The pain has been constant since onset. Associated symptoms include an inability to bear weight. Pertinent negatives include no loss of motion, loss of  sensation, muscle weakness, numbness or tingling. The symptoms are aggravated by movement and weight bearing. She has tried immobilization, acetaminophen, elevation and rest for the symptoms. The treatment provided no relief.         Review of Systems   Neurological: Negative for tingling and numbness.       FEMALE ROS:    REVIEW OF SYSTEMS:  The patient reports no problems with hearing or headaches.  She reports no problems with vision. She is advised to have a yearly eye examination. She has no chest pains or pressures, or shortness of breath.  She has no indigestion, heartburn, nausea, or vomiting.   She has no constipation, diarrhea, or  black, bloody, mucousy, or tarry stool.  She has no trouble passing urine or losing urine.  Libido seems to be  normal.  She reports no musculoskeletal aches or pains.  No paresthesias or loss of strength.          Objective    Filed Vitals:    06/06/13 1341   BP: 130/78   Pulse: 88   Temp: 97.7 ??F (36.5 ??C)   TempSrc: Oral   Resp: 18   Height: 5' 6"  (1.676 m)   Weight: 151 lb (68.493 kg)     Physical Exam      PHYSICAL EXAMINATION:   VITAL SIGNS: are as recorded.  GENERAL:  The patient appears well nourished and well-developed, and with normal affect.  No acute respiratory distress. Alert and oriented times 3. No skin rashes.     HEENT:  TMs normal bilaterally.  Canals and ears normal. Pharynx is clear. Extraocular eye motions intact and pain free.   Pupils reactive and equally round.  Sclerae and conjunctivae clear, normocephalic and atraumatic.      RESPIRATORY:  Clear and equal breath sounds with no acute respiratory distress.       HEART: Regular rhythm without murmur, rub or gallop.     ABDOMEN:  soft, nontender. No masses, guarding or rebound. Normoactive bowel sounds.     NECK: No masses or adenopathy palpable.  No bruits heard. No asymmetry visible.     COMPLETE EXTREMITIES: No edema, all four extremities with posterior tibial and radial pulses strong and palpable.      EXTREMITIES:  no edema in any extremity. No cyanosis or clubbing.    LOW BACK: No tenderness to palpation of inferior lumbar spine or either sacroiliac joint area.     oa B knees      Assessment & Plan   1. Type II or unspecified type diabetes mellitus without mention of complication, not stated as uncontrolled ([place])  sitaGLIPtin (JANUVIA) 100 MG tablet    esomeprazole (NEXIUM) 40 MG capsule    valsartan (DIOVAN) 80 MG tablet    metFORMIN ER (GLUCOPHAGE-XR) 500 MG XR tablet    alendronate (FOSAMAX) 70 MG tablet    atorvastatin (LIPITOR) 10 MG tablet    glimepiride (AMARYL) 4 MG tablet    BAYER MICROLET LANCETS MISC    amLODIPine (NORVASC) 10 MG tablet   2. Hypertension  sitaGLIPtin (JANUVIA) 100 MG tablet    esomeprazole (NEXIUM) 40 MG capsule    valsartan (DIOVAN) 80 MG tablet    metFORMIN ER (GLUCOPHAGE-XR) 500 MG XR tablet    alendronate (FOSAMAX) 70 MG tablet    atorvastatin (LIPITOR) 10 MG tablet    glimepiride (AMARYL) 4 MG tablet    amLODIPine (NORVASC) 10 MG tablet   3. OA (osteoarthritis)  sitaGLIPtin (JANUVIA) 100 MG tablet    esomeprazole (NEXIUM) 40 MG capsule    valsartan (DIOVAN) 80 MG tablet    metFORMIN ER (GLUCOPHAGE-XR) 500 MG XR tablet    alendronate (FOSAMAX) 70 MG tablet    atorvastatin (LIPITOR) 10 MG tablet    glimepiride (AMARYL) 4 MG tablet    amLODIPine (NORVASC) 10 MG tablet   4. Osteopenia  sitaGLIPtin (JANUVIA) 100 MG tablet    esomeprazole (NEXIUM) 40 MG capsule    valsartan (DIOVAN) 80 MG tablet    metFORMIN ER (GLUCOPHAGE-XR) 500 MG XR tablet    alendronate (FOSAMAX) 70 MG tablet    atorvastatin (LIPITOR) 10 MG tablet    glimepiride (AMARYL) 4 MG tablet    amLODIPine (NORVASC) 10 MG tablet   5. GERD (gastroesophageal reflux disease)  sitaGLIPtin (JANUVIA) 100 MG tablet  esomeprazole (NEXIUM) 40 MG capsule    valsartan (DIOVAN) 80 MG tablet    metFORMIN ER (GLUCOPHAGE-XR) 500 MG XR tablet    alendronate (FOSAMAX) 70 MG tablet    atorvastatin (LIPITOR) 10 MG tablet    glimepiride  (AMARYL) 4 MG tablet    amLODIPine (NORVASC) 10 MG tablet   6. Cancer (HCC)  sitaGLIPtin (JANUVIA) 100 MG tablet    esomeprazole (NEXIUM) 40 MG capsule    valsartan (DIOVAN) 80 MG tablet    metFORMIN ER (GLUCOPHAGE-XR) 500 MG XR tablet    alendronate (FOSAMAX) 70 MG tablet    atorvastatin (LIPITOR) 10 MG tablet    glimepiride (AMARYL) 4 MG tablet    amLODIPine (NORVASC) 10 MG tablet     No orders of the defined types were placed in this encounter.     Orders Placed This Encounter   Medications   ??? sitaGLIPtin (JANUVIA) 100 MG tablet     Sig: TAKE 1 TABLET BY MOUTH EVERY DAY.     Dispense:  90 tablet     Refill:  1   ??? esomeprazole (NEXIUM) 40 MG capsule     Sig: TAKE 1 CAPSULE BY MOUTH EVERY MORNING (BEFORE BREAKFAST).     Dispense:  90 capsule     Refill:  1   ??? valsartan (DIOVAN) 80 MG tablet     Sig: Take 1 tablet by mouth daily.     Dispense:  90 tablet     Refill:  1   ??? metFORMIN ER (GLUCOPHAGE-XR) 500 MG XR tablet     Sig: TAKE 1 TABLET BY MOUTH 4 TIMES DAILY.     Dispense:  360 tablet     Refill:  1   ??? alendronate (FOSAMAX) 70 MG tablet     Sig: TAKE 1 TABLET BY MOUTH ONCE A WEEK     Dispense:  12 tablet     Refill:  1   ??? atorvastatin (LIPITOR) 10 MG tablet     Sig: TAKE 1 TABLET BY MOUTH EVERY DAY.     Dispense:  90 tablet     Refill:  1   ??? glimepiride (AMARYL) 4 MG tablet     Sig: TAKE 2 TABLETS BY MOUTH EVERY DAY     Dispense:  180 tablet     Refill:  1   ??? dapagliflozin (FARXIGA) 5 MG tablet     Sig: Take 1 tablet by mouth every morning.     Dispense:  90 tablet     Refill:  1   ??? BAYER MICROLET LANCETS MISC     Sig: Once daily     Dispense:  100 each     Refill:  1     Dx 250.00   ??? amLODIPine (NORVASC) 10 MG tablet     Sig: TAKE 1 TABLET BY MOUTH EVERY DAY.     Dispense:  90 tablet     Refill:  1   ??? predniSONE (DELTASONE) 10 MG tablet     Sig: Take one tablet 4 times a day for 3 days, then one tablet 3 times a day for 3 days, then take one tablet 2 times a day for 3 days, and finally take one  tablet 1 times a day for three days.  [phone])     Dispense:  30 tablet     Refill:  0     Medications Discontinued During This Encounter   Medication Reason   ??? sitaGLIPtin (JANUVIA) 100 MG  tablet Reorder   ??? esomeprazole (NEXIUM) 40 MG capsule Reorder   ??? valsartan (DIOVAN) 80 MG tablet Reorder   ??? metFORMIN ER (GLUCOPHAGE-XR) 500 MG XR tablet Reorder   ??? alendronate (FOSAMAX) 70 MG tablet Reorder   ??? atorvastatin (LIPITOR) 10 MG tablet Reorder   ??? glimepiride (AMARYL) 4 MG tablet Reorder   ??? dapagliflozin (FARXIGA) 5 MG tablet Reorder   ??? BAYER MICROLET LANCETS MISC Reorder   ??? amLODIPine (NORVASC) 10 MG tablet Reorder     No health maintenance topics applied.    Return in about 4 weeks (around 07/04/2013).    Check knees next    Consider Synvisc next    Symptoms and complaints should be improving over 48 hours and entirely resolved in two weeks, if not, patient should call the office for a follow-up appointment.    See pv      BW next    Meryle Ready, MD

## 2013-06-15 NOTE — Telephone Encounter (Signed)
Prednisone needs pa. Submitted.

## 2013-06-20 NOTE — Telephone Encounter (Signed)
Approved through 06/18/16

## 2013-07-09 NOTE — Progress Notes (Signed)
Chief Complaint   Patient presents with   ??? Diabetes     Patient is here for a follow up on her diabetes     Past Medical History   Diagnosis Date   ??? Type II or unspecified type diabetes mellitus without mention of complication, not stated as uncontrolled ([place])    ??? Anxiety    ??? Hypertension    ??? Cancer ([place])      breast   ??? 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 Right knee pain 01/07/2012   ??? Colon polyp      Past Surgical History   Procedure Laterality Date   ??? Cholecystectomy     ??? Tonsillectomy  age 19   ??? Breast lumpectomy  2007   ??? Colonoscopy  09/18/12     [name]     Allergies   Allergen Reactions   ??? Morphine    ??? Oxycontin [Oxycodone]      History     Social History   ??? Marital Status: Married     Spouse Name: N/A     Number of Children: N/A   ??? Years of Education: N/A     Occupational History   ??? Not on file.     Social History Main Topics   ??? Smoking status: Never Smoker    ??? Smokeless tobacco: Never Used   ??? Alcohol Use: Yes      Comment: occ wine   ??? Drug Use: No   ??? Sexual Activity: Not on file     Other Topics Concern   ??? Not on file     Social History Narrative     Family History   Problem Relation Age of Onset   ??? High Blood Pressure Mother    ??? High Blood Pressure Father      Current Outpatient Prescriptions on File Prior to Visit   Medication Sig Dispense Refill   ??? sitaGLIPtin (JANUVIA) 100 MG tablet TAKE 1 TABLET BY MOUTH EVERY DAY.  90 tablet  1   ??? esomeprazole (NEXIUM) 40 MG capsule TAKE 1 CAPSULE BY MOUTH EVERY MORNING (BEFORE BREAKFAST).  90 capsule  1   ??? valsartan (DIOVAN) 80 MG tablet Take 1 tablet by mouth daily.  90 tablet  1   ??? metFORMIN ER (GLUCOPHAGE-XR) 500 MG XR tablet TAKE 1 TABLET BY MOUTH 4 TIMES DAILY.  360 tablet  1   ??? alendronate (FOSAMAX) 70 MG tablet TAKE 1 TABLET BY MOUTH ONCE A WEEK  12 tablet  1   ??? atorvastatin (LIPITOR) 10 MG tablet TAKE 1 TABLET BY MOUTH EVERY DAY.  90 tablet  1   ??? glimepiride (AMARYL) 4 MG tablet TAKE 2 TABLETS BY MOUTH EVERY DAY  180  tablet  1   ??? dapagliflozin (FARXIGA) 5 MG tablet Take 1 tablet by mouth every morning.  90 tablet  1   ??? BAYER MICROLET LANCETS MISC Once daily  100 each  1   ??? amLODIPine (NORVASC) 10 MG tablet TAKE 1 TABLET BY MOUTH EVERY DAY.  90 tablet  1   ??? Blood Glucose Monitoring Suppl (BAYER BREEZE 2 SYSTEM) W/DEVICE KIT by Does not apply route.  3 kit  0   ??? aspirin (ASPIRIN CHILDRENS) 81 MG chewable tablet Take 1 tablet by mouth daily.  90 tablet  3     No current facility-administered medications on file prior to visit.           Subjective  Lori Chase, 73 y.o. female  Diabetes  She presents for her follow-up diabetic visit. She has type 2 diabetes mellitus. Pertinent negatives for hypoglycemia include no confusion, dizziness or headaches. Pertinent negatives for diabetes include no blurred vision, no chest pain and no fatigue. There are no hypoglycemic complications. There are no diabetic complications. Risk factors for coronary artery disease include diabetes mellitus, dyslipidemia, sedentary lifestyle, post-menopausal and hypertension. Current diabetic treatment includes oral agent (triple therapy). She is compliant with treatment most of the time. She has not had a previous visit with a dietitian. She never participates in exercise. She does not see a podiatrist.Eye exam is current.     Treatment Adherence:   Medication compliance:  compliant most of the time  Diet compliance:  compliant most of the time  Weight trend: stable  Current exercise: no regular exercise      Diabetes Mellitus Type 2: Current symptoms/problems include none.    Home blood sugar records:  patient tests 1 time(s) per day  Any episodes of hypoglycemia? no  Eye exam current (within one year): yes  Tobacco history: She  reports that she has never smoked. She has never used smokeless tobacco.   Daily Aspirin? Yes  Known diabetic complications: none    Hypertension:  Home blood pressure monitoring: No.  She is adherent to a low sodium diet.  Patient denies chest pain, shortness of breath, headache, lightheadedness, blurred vision, peripheral edema, palpitations, dry cough and fatigue.  Antihypertensive medication side effects: no medication side effects noted.  Use of agents associated with hypertension: none.     Hyperlipidemia:  No new myalgias or GI upset on atorvastatin (Lipitor).       Lab Results   Component Value Date    LABA1C 8.3* 04/09/2013    LABA1C 9.4* 12/06/2012    LABA1C 8.7* 07/31/2012     Lab Results   Component Value Date    LABMICR 1.20 04/09/2013    CREATININE 0.66 04/09/2013     Lab Results   Component Value Date    ALT 44* 04/09/2013    AST 30 04/09/2013     Lab Results   Component Value Date    CHOL 148 04/09/2013    TRIG 158 04/09/2013    HDL 53 04/09/2013    LDLCALC 63 04/09/2013              Review of Systems   Constitutional: Negative for fatigue.   Eyes: Negative for blurred vision.   Cardiovascular: Negative for chest pain.   Neurological: Negative for dizziness and headaches.   Psychiatric/Behavioral: Negative for confusion.           Objective    Filed Vitals:    07/09/13 1222 07/09/13 1327   BP: 150/74 176/68   Pulse: 68    Temp: 97.9 ??F (36.6 ??C)    Resp: 12    Weight: 150 lb (68.04 kg)      Physical Exam        Assessment & Plan   1. OA (osteoarthritis)     2. OA (osteoarthritis) of knee  PR ARTHROCENTESIS ASPIR&/INJ MAJOR JT/BURSA W/O [place]    PR SYNVISC OR SYNVISC-ONE   3. Type II or unspecified type diabetes mellitus without mention of complication, not stated as uncontrolled     4. Hypertension     5. GERD (gastroesophageal reflux disease)     6. Symptomatic menopausal or female climacteric states       Orders Placed This  Encounter   Procedures   ??? PR ARTHROCENTESIS ASPIR&/INJ MAJOR JT/BURSA W/O [place]   ??? PR SYNVISC OR SYNVISC-ONE     synvisc one (48 units)     No orders of the defined types were placed in this encounter.     There are no discontinued medications.  No health maintenance topics applied.    Return in about 4  weeks (around 08/06/2013).    Risks were explained.  Consent was obtained.  Joint was injected Synvisc.  Pt tolerated procedure well.  There were no complications.L knee    BW next    PT & ice    See pv    Symptoms and complaints should be improving over 48 hours and entirely resolved in two weeks, if not, patient should call the office for a follow-up appointment.      Meryle Ready, MD

## 2013-08-10 NOTE — Telephone Encounter (Signed)
Pt called requesting Rx Refill

## 2013-08-11 MED ORDER — DAPAGLIFLOZIN PROPANEDIOL 5 MG PO TABS
5 MG | ORAL_TABLET | Freq: Every morning | ORAL | Status: DC
Start: 2013-08-11 — End: 2013-11-12

## 2013-09-05 MED ORDER — GLIMEPIRIDE 4 MG PO TABS
4 MG | ORAL_TABLET | ORAL | Status: DC
Start: 2013-09-05 — End: 2013-11-12

## 2013-09-05 MED ORDER — ATORVASTATIN CALCIUM 10 MG PO TABS
10 MG | ORAL_TABLET | ORAL | Status: DC
Start: 2013-09-05 — End: 2013-11-12

## 2013-09-05 MED ORDER — VALSARTAN 80 MG PO TABS
80 MG | ORAL_TABLET | Freq: Every day | ORAL | Status: DC
Start: 2013-09-05 — End: 2013-11-12

## 2013-09-05 MED ORDER — ALENDRONATE SODIUM 70 MG PO TABS
70 MG | ORAL_TABLET | ORAL | Status: DC
Start: 2013-09-05 — End: 2013-11-12

## 2013-09-05 MED ORDER — METFORMIN HCL ER 500 MG PO TB24
500 MG | ORAL_TABLET | ORAL | Status: DC
Start: 2013-09-05 — End: 2013-11-12

## 2013-09-05 MED ORDER — SITAGLIPTIN PHOSPHATE 100 MG PO TABS
100 MG | ORAL_TABLET | ORAL | Status: DC
Start: 2013-09-05 — End: 2013-11-12

## 2013-09-05 MED ORDER — AMLODIPINE BESYLATE 10 MG PO TABS
10 MG | ORAL_TABLET | ORAL | Status: DC
Start: 2013-09-05 — End: 2013-11-12

## 2013-09-05 MED ORDER — BAYER MICROLET LANCETS MISC
Status: DC
Start: 2013-09-05 — End: 2016-02-06

## 2013-09-05 MED ORDER — ESOMEPRAZOLE MAGNESIUM 40 MG PO CPDR
40 MG | ORAL_CAPSULE | ORAL | Status: DC
Start: 2013-09-05 — End: 2013-11-12

## 2013-09-05 NOTE — Progress Notes (Signed)
Chief Complaint   Patient presents with   ??? Hypertension     here for checkup. Medication refills.     Past Medical History   Diagnosis Date   ??? Type II or unspecified type diabetes mellitus without mention of complication, not stated as uncontrolled ([place])    ??? Anxiety    ??? Hypertension    ??? Cancer ([place])      breast   ??? 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 Right knee pain 01/07/2012   ??? Colon polyp      Past Surgical History   Procedure Laterality Date   ??? Cholecystectomy     ??? Tonsillectomy  age 62   ??? Breast lumpectomy  2007   ??? Colonoscopy  09/18/12     [name]     Allergies   Allergen Reactions   ??? Morphine    ??? Oxycontin [Oxycodone]      History     Social History   ??? Marital Status: Married     Spouse Name: N/A     Number of Children: N/A   ??? Years of Education: N/A     Occupational History   ??? Not on file.     Social History Main Topics   ??? Smoking status: Never Smoker    ??? Smokeless tobacco: Never Used   ??? Alcohol Use: Yes      Comment: occ wine   ??? Drug Use: No   ??? Sexual Activity: Not on file     Other Topics Concern   ??? Not on file     Social History Narrative     Family History   Problem Relation Age of Onset   ??? High Blood Pressure Mother    ??? High Blood Pressure Father      Current Outpatient Prescriptions on File Prior to Visit   Medication Sig Dispense Refill   ??? dapagliflozin (FARXIGA) 5 MG tablet Take 1 tablet by mouth every morning.  90 tablet  1   ??? Blood Glucose Monitoring Suppl (BAYER BREEZE 2 SYSTEM) W/DEVICE KIT by Does not apply route.  3 kit  0   ??? aspirin (ASPIRIN CHILDRENS) 81 MG chewable tablet Take 1 tablet by mouth daily.  90 tablet  3     No current facility-administered medications on file prior to visit.           Subjective  Abram Sander, 73 y.o. female  Hypertension  This is a chronic problem. The current episode started more than 1 year ago. The problem has been resolved since onset. The problem is controlled. Pertinent negatives include no anxiety, blurred vision,  chest pain, headaches, malaise/fatigue, neck pain, palpitations, shortness of breath or sweats.         Review of Systems   Constitutional: Negative for malaise/fatigue.   Eyes: Negative for blurred vision.   Respiratory: Negative for shortness of breath.    Cardiovascular: Negative for chest pain and palpitations.   Musculoskeletal: Negative for neck pain.   Neurological: Negative for headaches.           Objective    Filed Vitals:    09/05/13 1501   BP: 132/66   Pulse: 80   Temp: 97.3 ??F (36.3 ??C)   TempSrc: Temporal   Resp: 24   Height: 5' 6"  (1.676 m)   Weight: 145 lb (65.772 kg)     Physical Exam      PHYSICAL EXAMINATION:   VITAL SIGNS: are  as recorded.  GENERAL:  The patient appears well nourished and well-developed, and with normal affect.  No acute respiratory distress. Alert and oriented times 3. No skin rashes.     HEENT:  TMs normal bilaterally.  Canals and ears normal. Pharynx is clear. Extraocular eye motions intact and pain free.   Pupils reactive and equally round.  Sclerae and conjunctivae clear, normocephalic and atraumatic.      RESPIRATORY:  Clear and equal breath sounds with no acute respiratory distress.       HEART: Regular rhythm without murmur, rub or gallop.     ABDOMEN:  soft, nontender. No masses, guarding or rebound. Normoactive bowel sounds.          Assessment & Plan   1. Type II or unspecified type diabetes mellitus without mention of complication, not stated as uncontrolled ([facility])  sitaGLIPtin (JANUVIA) 100 MG tablet    esomeprazole (NEXIUM) 40 MG capsule    valsartan (DIOVAN) 80 MG tablet    metFORMIN ER (GLUCOPHAGE-XR) 500 MG XR tablet    alendronate (FOSAMAX) 70 MG tablet    atorvastatin (LIPITOR) 10 MG tablet    glimepiride (AMARYL) 4 MG tablet    BAYER MICROLET LANCETS MISC    amLODIPine (NORVASC) 10 MG tablet    Comprehensive Metabolic Panel    Lipid Panel    Hemoglobin A1C   2. Hypertension  sitaGLIPtin (JANUVIA) 100 MG tablet    esomeprazole (NEXIUM) 40 MG capsule    valsartan  (DIOVAN) 80 MG tablet    metFORMIN ER (GLUCOPHAGE-XR) 500 MG XR tablet    alendronate (FOSAMAX) 70 MG tablet    atorvastatin (LIPITOR) 10 MG tablet    glimepiride (AMARYL) 4 MG tablet    amLODIPine (NORVASC) 10 MG tablet   3. OA (osteoarthritis)  sitaGLIPtin (JANUVIA) 100 MG tablet    esomeprazole (NEXIUM) 40 MG capsule    valsartan (DIOVAN) 80 MG tablet    metFORMIN ER (GLUCOPHAGE-XR) 500 MG XR tablet    alendronate (FOSAMAX) 70 MG tablet    atorvastatin (LIPITOR) 10 MG tablet    glimepiride (AMARYL) 4 MG tablet    amLODIPine (NORVASC) 10 MG tablet   4. Osteopenia  sitaGLIPtin (JANUVIA) 100 MG tablet    esomeprazole (NEXIUM) 40 MG capsule    valsartan (DIOVAN) 80 MG tablet    metFORMIN ER (GLUCOPHAGE-XR) 500 MG XR tablet    alendronate (FOSAMAX) 70 MG tablet    atorvastatin (LIPITOR) 10 MG tablet    glimepiride (AMARYL) 4 MG tablet    amLODIPine (NORVASC) 10 MG tablet   5. GERD (gastroesophageal reflux disease)  sitaGLIPtin (JANUVIA) 100 MG tablet    esomeprazole (NEXIUM) 40 MG capsule    valsartan (DIOVAN) 80 MG tablet    metFORMIN ER (GLUCOPHAGE-XR) 500 MG XR tablet    alendronate (FOSAMAX) 70 MG tablet    atorvastatin (LIPITOR) 10 MG tablet    glimepiride (AMARYL) 4 MG tablet    amLODIPine (NORVASC) 10 MG tablet   6. Cancer (HCC)  sitaGLIPtin (JANUVIA) 100 MG tablet    esomeprazole (NEXIUM) 40 MG capsule    valsartan (DIOVAN) 80 MG tablet    metFORMIN ER (GLUCOPHAGE-XR) 500 MG XR tablet    alendronate (FOSAMAX) 70 MG tablet    atorvastatin (LIPITOR) 10 MG tablet    glimepiride (AMARYL) 4 MG tablet    amLODIPine (NORVASC) 10 MG tablet   7. Screening      Fall Risk Assessment Completed   8. Other malaise and fatigue  CBC Auto Differential     Orders Placed This Encounter   Procedures   ??? CBC Auto Differential   ??? Comprehensive Metabolic Panel   ??? Lipid Panel     Order Specific Question:  Is Patient Fasting?/# of Hours     Answer:  yes   ??? Hemoglobin A1C     Orders Placed This Encounter   Medications   ???  sitaGLIPtin (JANUVIA) 100 MG tablet     Sig: TAKE 1 TABLET BY MOUTH EVERY DAY.     Dispense:  90 tablet     Refill:  1   ??? esomeprazole (NEXIUM) 40 MG capsule     Sig: TAKE 1 CAPSULE BY MOUTH EVERY MORNING (BEFORE BREAKFAST).     Dispense:  90 capsule     Refill:  1   ??? valsartan (DIOVAN) 80 MG tablet     Sig: Take 1 tablet by mouth daily.     Dispense:  90 tablet     Refill:  1   ??? metFORMIN ER (GLUCOPHAGE-XR) 500 MG XR tablet     Sig: TAKE 1 TABLET BY MOUTH 4 TIMES DAILY.     Dispense:  360 tablet     Refill:  1   ??? alendronate (FOSAMAX) 70 MG tablet     Sig: TAKE 1 TABLET BY MOUTH ONCE A WEEK     Dispense:  12 tablet     Refill:  1   ??? atorvastatin (LIPITOR) 10 MG tablet     Sig: TAKE 1 TABLET BY MOUTH EVERY DAY.     Dispense:  90 tablet     Refill:  1   ??? glimepiride (AMARYL) 4 MG tablet     Sig: TAKE 2 TABLETS BY MOUTH EVERY DAY     Dispense:  180 tablet     Refill:  1   ??? BAYER MICROLET LANCETS MISC     Sig: Once daily     Dispense:  100 each     Refill:  1     Dx 250.00   ??? amLODIPine (NORVASC) 10 MG tablet     Sig: TAKE 1 TABLET BY MOUTH EVERY DAY.     Dispense:  90 tablet     Refill:  1     Medications Discontinued During This Encounter   Medication Reason   ??? sitaGLIPtin (JANUVIA) 100 MG tablet Reorder   ??? esomeprazole (NEXIUM) 40 MG capsule Reorder   ??? valsartan (DIOVAN) 80 MG tablet Reorder   ??? metFORMIN ER (GLUCOPHAGE-XR) 500 MG XR tablet Reorder   ??? alendronate (FOSAMAX) 70 MG tablet Reorder   ??? atorvastatin (LIPITOR) 10 MG tablet Reorder   ??? glimepiride (AMARYL) 4 MG tablet Reorder   ??? BAYER MICROLET LANCETS MISC Reorder   ??? amLODIPine (NORVASC) 10 MG tablet Reorder     Health Maintenance Due   Topic Date Due   ??? TETANUS VACCINE ADULT (11 YEARS AND UP)  05/24/2012     Lose wt    Check sugars    Symptoms and complaints should be improving over 48 hours and entirely resolved in two weeks, if not, patient should call the office for a follow-up appointment.      Return in about 5 weeks (around  10/10/2013).    Meryle Ready, MD

## 2013-09-06 LAB — COMPREHENSIVE METABOLIC PANEL
ALT: 27 U/L (ref 0–33)
AST: 20 U/L (ref 0–35)
Albumin: 4.2 g/dL (ref 3.9–4.9)
Alkaline Phosphatase: 65 U/L (ref 40–130)
Anion Gap: 14 mEq/L — ABNORMAL HIGH (ref 7–13)
BUN: 10 mg/dL (ref 8–23)
CO2: 23 mEq/L (ref 22–29)
Calcium: 9.7 mg/dL (ref 8.6–10.2)
Chloride: 100 mEq/L (ref 98–107)
Creatinine: 0.59 mg/dL (ref 0.50–0.90)
GFR African American: >60 (ref >60)
GFR Non-African American: >60 (ref >60)
Globulin: 3.2 g/dL (ref 2.3–3.5)
Glucose: 145 mg/dL — ABNORMAL HIGH (ref 74–109)
Potassium: 4.8 mEq/L (ref 3.5–5.1)
Sodium: 137 mEq/L (ref 132–144)
Total Bilirubin: 0.2 mg/dL (ref 0.0–1.2)
Total Protein: 7.4 g/dL (ref 6.4–8.1)

## 2013-09-06 LAB — CBC WITH DIFFERENTIAL
Basophils %: 0.4 %
Basophils Absolute: 0 10*3/uL (ref 0.0–0.2)
Eosinophils %: 3 %
Eosinophils Absolute: 0.3 10*3/uL (ref 0.0–0.7)
Hematocrit: 37.7 % (ref 37.0–47.0)
Hemoglobin: 11.7 g/dL — ABNORMAL LOW (ref 12.0–16.0)
Lymphocytes %: 25 %
Lymphocytes Absolute: 2.7 10*3/uL (ref 1.0–4.8)
MCH: 24 pg — ABNORMAL LOW (ref 27.0–31.3)
MCHC: 30.9 % — ABNORMAL LOW (ref 33.0–37.0)
MCV: 77.7 fL — ABNORMAL LOW (ref 82.0–100.0)
MPV: 11.3 fL — ABNORMAL HIGH (ref 7.4–10.4)
Monocytes %: 7.7 %
Monocytes Absolute: 0.8 10*3/uL (ref 0.2–0.8)
Neutrophils %: 63.9 %
Neutrophils Absolute: 6.8 10*3/uL — ABNORMAL HIGH (ref 1.4–6.5)
Platelets: 261 10*3/uL (ref 130–400)
RBC: 4.86 M/uL (ref 4.20–5.40)
RDW: 19.3 % — ABNORMAL HIGH (ref 11.5–14.5)
WBC: 10.6 10*3/uL (ref 4.8–10.8)

## 2013-09-06 LAB — HEMOGLOBIN A1C: Hemoglobin A1C: 8.2 % — ABNORMAL HIGH (ref 4.8–5.9)

## 2013-09-06 LAB — LIPID PANEL
Cholesterol, Total: 148 mg/dL (ref 0–199)
HDL: 52 mg/dL (ref 40–59)
LDL Calculated: 61 mg/dL (ref 0–129)
Triglycerides: 176 mg/dL (ref 0–200)

## 2013-09-06 NOTE — Care Coordination-Inpatient (Signed)
Met with Lori Chase today at dr appt. States her blood sugar is 160-170. She has lost 12 pounds. She does monitor carbohydrate intake. She is eating 3 meals a day. She walks daily and exercises with weights and stretch bands. She is feeling well. At this time, she does not need any Scientist, clinical (histocompatibility and immunogenetics)

## 2013-09-11 NOTE — Telephone Encounter (Signed)
farxiga needs pa. Submitted.

## 2013-09-13 NOTE — Telephone Encounter (Signed)
Approved 08/12/13-09/11/14

## 2013-10-10 NOTE — Progress Notes (Signed)
Chief Complaint   Patient presents with   ??? Diabetes     F/U     Past Medical History   Diagnosis Date   ??? Type II or unspecified type diabetes mellitus without mention of complication, not stated as uncontrolled ([place])    ??? Anxiety    ??? Hypertension    ??? Cancer ([place])      breast   ??? 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 Right knee pain 01/07/2012   ??? Colon polyp      Past Surgical History   Procedure Laterality Date   ??? Cholecystectomy     ??? Tonsillectomy  age 3   ??? Breast lumpectomy  2007   ??? Colonoscopy  09/18/12     [name]     Allergies   Allergen Reactions   ??? Morphine    ??? Oxycontin [Oxycodone]      History     Social History   ??? Marital Status: Married     Spouse Name: N/A     Number of Children: N/A   ??? Years of Education: N/A     Occupational History   ??? Not on file.     Social History Main Topics   ??? Smoking status: Never Smoker    ??? Smokeless tobacco: Never Used   ??? Alcohol Use: Yes      Comment: occ wine   ??? Drug Use: No   ??? Sexual Activity: Not on file     Other Topics Concern   ??? Not on file     Social History Narrative     Family History   Problem Relation Age of Onset   ??? High Blood Pressure Mother    ??? High Blood Pressure Father      Current Outpatient Prescriptions on File Prior to Visit   Medication Sig Dispense Refill   ??? sitaGLIPtin (JANUVIA) 100 MG tablet TAKE 1 TABLET BY MOUTH EVERY DAY.  90 tablet  1   ??? esomeprazole (NEXIUM) 40 MG capsule TAKE 1 CAPSULE BY MOUTH EVERY MORNING (BEFORE BREAKFAST).  90 capsule  1   ??? valsartan (DIOVAN) 80 MG tablet Take 1 tablet by mouth daily.  90 tablet  1   ??? metFORMIN ER (GLUCOPHAGE-XR) 500 MG XR tablet TAKE 1 TABLET BY MOUTH 4 TIMES DAILY.  360 tablet  1   ??? alendronate (FOSAMAX) 70 MG tablet TAKE 1 TABLET BY MOUTH ONCE A WEEK  12 tablet  1   ??? atorvastatin (LIPITOR) 10 MG tablet TAKE 1 TABLET BY MOUTH EVERY DAY.  90 tablet  1   ??? glimepiride (AMARYL) 4 MG tablet TAKE 2 TABLETS BY MOUTH EVERY DAY  180 tablet  1   ??? BAYER MICROLET LANCETS MISC  Once daily  100 each  1   ??? amLODIPine (NORVASC) 10 MG tablet TAKE 1 TABLET BY MOUTH EVERY DAY.  90 tablet  1   ??? dapagliflozin (FARXIGA) 5 MG tablet Take 1 tablet by mouth every morning.  90 tablet  1   ??? Blood Glucose Monitoring Suppl (BAYER BREEZE 2 SYSTEM) W/DEVICE KIT by Does not apply route.  3 kit  0   ??? aspirin (ASPIRIN CHILDRENS) 81 MG chewable tablet Take 1 tablet by mouth daily.  90 tablet  3     No current facility-administered medications on file prior to visit.           Subjective  Lori Chase, 73 y.o. female  Diabetes  She presents for her follow-up diabetic visit. She has type 2 diabetes mellitus. No MedicAlert identification noted. The initial diagnosis of diabetes was made 10 years ago. Her disease course has been stable. There are no hypoglycemic associated symptoms. There are no diabetic associated symptoms. There are no hypoglycemic complications. Symptoms are stable. There are no diabetic complications. Risk factors for coronary artery disease include hypertension and post-menopausal. Current diabetic treatment includes oral agent (triple therapy). She is compliant with treatment all of the time. Her weight is stable. Diabetic current diet: no diet followed. When asked about meal planning, she reported none. She has not had a previous visit with a dietitian. She rarely participates in exercise. She monitors blood glucose at home 1-2 x per day. Blood glucose monitoring compliance is good. There is no change in her home blood glucose trend. Her breakfast blood glucose is taken between 7-8 am. Her breakfast blood glucose range is generally 140-180 mg/dl. Her dinner blood glucose is taken between 5-6 pm. Her dinner blood glucose range is generally 140-180 mg/dl. An ACE inhibitor/angiotensin II receptor blocker is being taken. She does not see a podiatrist.Eye exam is current.     Chief Complaint   Patient presents with   ??? Diabetes     F/U     Hypertension:  Home blood pressure monitoring: Yes  - weekly.  She is not adherent to a low sodium diet. Patient denies chest pain, shortness of breath, headache, lightheadedness, blurred vision, peripheral edema, palpitations, dry cough and fatigue.  Antihypertensive medication side effects: no medication side effects noted.  Use of agents associated with hypertension: none.     Hyperlipidemia:  No new myalgias or GI upset on atorvastatin (Lipitor).       Lab Results   Component Value Date    LABA1C 8.2* 09/05/2013    LABA1C 8.3* 04/09/2013    LABA1C 9.4* 12/06/2012     Lab Results   Component Value Date    LABMICR 1.20 04/09/2013    CREATININE 0.59 09/05/2013     Lab Results   Component Value Date    ALT 27 09/05/2013    AST 20 09/05/2013     Lab Results   Component Value Date    CHOL 148 09/05/2013    TRIG 176 09/05/2013    HDL 52 09/05/2013    LDLCALC 61 09/05/2013            Review of Systems    FEMALE ROS:    REVIEW OF SYSTEMS:  The patient reports no problems with hearing or headaches.  She reports no problems with vision. She is advised to have a yearly eye examination. She has no chest pains or pressures, or shortness of breath.  She has no indigestion, heartburn, nausea, or vomiting.   She has no constipation, diarrhea, or  black, bloody, mucousy, or tarry stool.  She has no trouble passing urine or losing urine.  Libido seems to be normal.  She reports no musculoskeletal aches or pains.  No paresthesias or loss of strength.            Objective    Filed Vitals:    10/10/13 1343   BP: 122/70   Pulse: 88   Temp: 96.7 ??F (35.9 ??C)   TempSrc: Oral   Resp: 14   Height: _0  (1.676 m)   Weight: 151 lb (68.493 kg)     Physical Exam      PHYSICAL EXAMINATION:   VITAL SIGNS: are as  recorded.  GENERAL:  The patient appears well nourished and well-developed, and with normal affect.  No acute respiratory distress. Alert and oriented times 3. No skin rashes.     HEENT:  TMs normal bilaterally.  Canals and ears normal. Pharynx is clear. Extraocular eye motions intact and pain  free.   Pupils reactive and equally round.  Sclerae and conjunctivae clear, normocephalic and atraumatic.      RESPIRATORY:  Clear and equal breath sounds with no acute respiratory distress.       HEART: Regular rhythm without murmur, rub or gallop.     ABDOMEN:  soft, nontender. No masses, guarding or rebound. Normoactive bowel sounds.     NECK: No masses or adenopathy palpable.  No bruits heard. No asymmetry visible.         Assessment & Plan   1. Type II or unspecified type diabetes mellitus without mention of complication, not stated as uncontrolled    2. OA (osteoarthritis) of knee    3. GERD (gastroesophageal reflux disease)    4. Hypertension    5. OA (osteoarthritis)    6. 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)      No orders of the defined types were placed in this encounter.     No orders of the defined types were placed in this encounter.     There are no discontinued medications.  Health Maintenance Due   Topic Date Due   ??? TETANUS VACCINE ADULT (11 YEARS AND UP)  05/24/2012     Symptoms and complaints should be improving over 48 hours and entirely resolved in two weeks, if not, patient should call the office for a follow-up appointment.     Lose wt    Return in about 4 weeks (around 11/07/2013).    Meryle Ready, MD

## 2013-10-11 NOTE — Care Coordination-Inpatient (Signed)
BRENLYNN FAKE  10/11/2013                Nurse Care Coordinator Progress Note    Ryland is a 73 y.o. female seen in the office today for follow up patient education/ health coaching.     Overall goals:  Goals    ??? Blood sugars BID, log and bring to office.      ??? HEMOGLOBIN A1C < 7.0     ??? LDL CALC < 130     ??? Omit white flour pasta, rice and bread.     ??? Portion control           Assessment/ Progress:  Met with Meara today at Dr visit. She states she takes medications as prescribed and follows exercise program. Her blood sugar today 150. She had questions regarding portion sizes. Reviewed portion sizes    Education Materials given:   Booklet carb counting and meal planning    Plan of Care:  Will meet with Zuleika at next dr appt      Alphonzo Grieve RN, BSN  Nurse Care Coordinator

## 2013-11-12 MED ORDER — ALENDRONATE SODIUM 70 MG PO TABS
70 MG | ORAL_TABLET | ORAL | Status: DC
Start: 2013-11-12 — End: 2014-06-01

## 2013-11-12 MED ORDER — SITAGLIPTIN PHOSPHATE 100 MG PO TABS
100 MG | ORAL_TABLET | ORAL | Status: DC
Start: 2013-11-12 — End: 2014-06-01

## 2013-11-12 MED ORDER — DAPAGLIFLOZIN PROPANEDIOL 10 MG PO TABS
10 MG | ORAL_TABLET | ORAL | Status: DC
Start: 2013-11-12 — End: 2014-05-13

## 2013-11-12 MED ORDER — AMLODIPINE BESYLATE 10 MG PO TABS
10 MG | ORAL_TABLET | ORAL | Status: DC
Start: 2013-11-12 — End: 2014-06-01

## 2013-11-12 MED ORDER — GLIMEPIRIDE 4 MG PO TABS
4 MG | ORAL_TABLET | ORAL | Status: DC
Start: 2013-11-12 — End: 2014-05-23

## 2013-11-12 MED ORDER — ATORVASTATIN CALCIUM 10 MG PO TABS
10 MG | ORAL_TABLET | ORAL | Status: DC
Start: 2013-11-12 — End: 2014-05-30

## 2013-11-12 MED ORDER — ESOMEPRAZOLE MAGNESIUM 40 MG PO CPDR
40 MG | ORAL_CAPSULE | ORAL | Status: DC
Start: 2013-11-12 — End: 2014-05-12

## 2013-11-12 MED ORDER — METFORMIN HCL ER 500 MG PO TB24
500 MG | ORAL_TABLET | ORAL | Status: DC
Start: 2013-11-12 — End: 2014-05-30

## 2013-11-12 MED ORDER — VALSARTAN 80 MG PO TABS
80 MG | ORAL_TABLET | Freq: Every day | ORAL | Status: DC
Start: 2013-11-12 — End: 2014-05-23

## 2013-11-12 NOTE — Progress Notes (Signed)
Chief Complaint   Patient presents with   ??? Diabetes     1 month follow up.     Past Medical History   Diagnosis Date   ??? Type II or unspecified type diabetes mellitus without mention of complication, not stated as uncontrolled ([place])    ??? Anxiety    ??? Hypertension    ??? Cancer ([place])      breast   ??? 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 Right knee pain 01/07/2012   ??? Colon polyp      Past Surgical History   Procedure Laterality Date   ??? Cholecystectomy     ??? Tonsillectomy  age 36   ??? Breast lumpectomy  2007   ??? Colonoscopy  09/18/12     [name]     Allergies   Allergen Reactions   ??? Morphine    ??? Oxycontin [Oxycodone]      History     Social History   ??? Marital Status: Married     Spouse Name: N/A     Number of Children: N/A   ??? Years of Education: N/A     Occupational History   ??? Not on file.     Social History Main Topics   ??? Smoking status: Never Smoker    ??? Smokeless tobacco: Never Used   ??? Alcohol Use: Yes      Comment: occ wine   ??? Drug Use: No   ??? Sexual Activity: Not on file     Other Topics Concern   ??? Not on file     Social History Narrative     Family History   Problem Relation Age of Onset   ??? High Blood Pressure Mother    ??? High Blood Pressure Father      Current Outpatient Prescriptions on File Prior to Visit   Medication Sig Dispense Refill   ??? BAYER MICROLET LANCETS MISC Once daily  100 each  1   ??? Blood Glucose Monitoring Suppl (BAYER BREEZE 2 SYSTEM) W/DEVICE KIT by Does not apply route.  3 kit  0   ??? aspirin (ASPIRIN CHILDRENS) 81 MG chewable tablet Take 1 tablet by mouth daily.  90 tablet  3     No current facility-administered medications on file prior to visit.           Subjective  Lori Chase, 73 y.o. female  Diabetes  She presents for her follow-up diabetic visit. She has type 2 diabetes mellitus. Her disease course has been stable. Symptoms are stable. Current diabetic treatments: medication prescribed. She is compliant with treatment all of the time. Her weight is stable. She is  following a generally healthy diet.         Review of Systems        Objective    Filed Vitals:    11/12/13 1423   BP: 128/72   Pulse: 80   Temp: 98.2 ??F (36.8 ??C)   TempSrc: Temporal   Resp: 20   Height: 5' 6"  (1.676 m)   Weight: 150 lb 6.4 oz (68.221 kg)     Physical Exam        Assessment & Plan   1. Type II or unspecified type diabetes mellitus without mention of complication, not stated as uncontrolled ([facility])  sitaGLIPtin (JANUVIA) 100 MG tablet    esomeprazole (NEXIUM) 40 MG capsule    valsartan (DIOVAN) 80 MG tablet    metFORMIN ER (GLUCOPHAGE-XR) 500 MG XR tablet  alendronate (FOSAMAX) 70 MG tablet    atorvastatin (LIPITOR) 10 MG tablet    glimepiride (AMARYL) 4 MG tablet    amLODIPine (NORVASC) 10 MG tablet   2. Hypertension  sitaGLIPtin (JANUVIA) 100 MG tablet    esomeprazole (NEXIUM) 40 MG capsule    valsartan (DIOVAN) 80 MG tablet    metFORMIN ER (GLUCOPHAGE-XR) 500 MG XR tablet    alendronate (FOSAMAX) 70 MG tablet    atorvastatin (LIPITOR) 10 MG tablet    glimepiride (AMARYL) 4 MG tablet    amLODIPine (NORVASC) 10 MG tablet   3. OA (osteoarthritis)  sitaGLIPtin (JANUVIA) 100 MG tablet    esomeprazole (NEXIUM) 40 MG capsule    valsartan (DIOVAN) 80 MG tablet    metFORMIN ER (GLUCOPHAGE-XR) 500 MG XR tablet    alendronate (FOSAMAX) 70 MG tablet    atorvastatin (LIPITOR) 10 MG tablet    glimepiride (AMARYL) 4 MG tablet    amLODIPine (NORVASC) 10 MG tablet   4. Osteopenia  sitaGLIPtin (JANUVIA) 100 MG tablet    esomeprazole (NEXIUM) 40 MG capsule    valsartan (DIOVAN) 80 MG tablet    metFORMIN ER (GLUCOPHAGE-XR) 500 MG XR tablet    alendronate (FOSAMAX) 70 MG tablet    atorvastatin (LIPITOR) 10 MG tablet    glimepiride (AMARYL) 4 MG tablet    amLODIPine (NORVASC) 10 MG tablet   5. GERD (gastroesophageal reflux disease)  sitaGLIPtin (JANUVIA) 100 MG tablet    esomeprazole (NEXIUM) 40 MG capsule    valsartan (DIOVAN) 80 MG tablet    metFORMIN ER (GLUCOPHAGE-XR) 500 MG XR tablet    alendronate (FOSAMAX) 70  MG tablet    atorvastatin (LIPITOR) 10 MG tablet    glimepiride (AMARYL) 4 MG tablet    amLODIPine (NORVASC) 10 MG tablet   6. Cancer ([facility]CC)  sitaGLIPtin (JANUVIA) 100 MG tablet    esomeprazole (NEXIUM) 40 MG capsule    valsartan (DIOVAN) 80 MG tablet    metFORMIN ER (GLUCOPHAGE-XR) 500 MG XR tablet    alendronate (FOSAMAX) 70 MG tablet    atorvastatin (LIPITOR) 10 MG tablet    glimepiride (AMARYL) 4 MG tablet    amLODIPine (NORVASC) 10 MG tablet     No orders of the defined types were placed in this encounter.     Orders Placed This Encounter   Medications   ??? sitaGLIPtin (JANUVIA) 100 MG tablet     Sig: TAKE 1 TABLET BY MOUTH EVERY DAY.     Dispense:  90 tablet     Refill:  1   ??? esomeprazole (NEXIUM) 40 MG capsule     Sig: TAKE 1 CAPSULE BY MOUTH EVERY MORNING (BEFORE BREAKFAST).     Dispense:  90 capsule     Refill:  1   ??? valsartan (DIOVAN) 80 MG tablet     Sig: Take 1 tablet by mouth daily     Dispense:  90 tablet     Refill:  1   ??? metFORMIN ER (GLUCOPHAGE-XR) 500 MG XR tablet     Sig: TAKE 1 TABLET BY MOUTH 4 TIMES DAILY.     Dispense:  360 tablet     Refill:  1   ??? alendronate (FOSAMAX) 70 MG tablet     Sig: TAKE 1 TABLET BY MOUTH ONCE A WEEK     Dispense:  12 tablet     Refill:  1   ??? atorvastatin (LIPITOR) 10 MG tablet     Sig: TAKE 1 TABLET BY MOUTH EVERY  DAY.     Dispense:  90 tablet     Refill:  1   ??? glimepiride (AMARYL) 4 MG tablet     Sig: TAKE 2 TABLETS BY MOUTH EVERY DAY     Dispense:  180 tablet     Refill:  1   ??? amLODIPine (NORVASC) 10 MG tablet     Sig: TAKE 1 TABLET BY MOUTH EVERY DAY.     Dispense:  90 tablet     Refill:  1   ??? dapagliflozin (FARXIGA) 10 MG tablet     Sig: qd     Dispense:  90 tablet     Refill:  1     Medications Discontinued During This Encounter   Medication Reason   ??? sitaGLIPtin (JANUVIA) 100 MG tablet Reorder   ??? esomeprazole (NEXIUM) 40 MG capsule Reorder   ??? valsartan (DIOVAN) 80 MG tablet Reorder   ??? metFORMIN ER (GLUCOPHAGE-XR) 500 MG XR tablet Reorder   ???  alendronate (FOSAMAX) 70 MG tablet Reorder   ??? atorvastatin (LIPITOR) 10 MG tablet Reorder   ??? glimepiride (AMARYL) 4 MG tablet Reorder   ??? amLODIPine (NORVASC) 10 MG tablet Reorder   ??? dapagliflozin (FARXIGA) 5 MG tablet Reorder     Health Maintenance Due   Topic Date Due   ??? DIABETES-POOR CONTROL (A1c >=8)  04-28-1941   ??? TETANUS VACCINE ADULT (11 YEARS AND UP)  05/24/2012       Return in about 5 weeks (around 12/18/2013).    Meryle Ready, MD

## 2013-11-13 NOTE — Care Coordination-Inpatient (Signed)
Lori Chase  11/13/2013                Nurse Care Coordinator Progress Note    Lori Chase is a 73 y.o. female seen in the office today for follow up patient education/ health coaching.     Overall goals:  Goals    ??? Blood sugars BID, log and bring to office.      ??? HEMOGLOBIN A1C < 7.0     ??? LDL CALC < 130     ??? Omit white flour pasta, rice and bread.     ??? Portion control           Assessment/ Progress:  Met with Lori Chase at Dr visit. Reports her blood sugar is ranging 150-170. She reports seh is following diabetic diet and exercises daily with weights. Walks 2-3 x week. She takes medication as ordered. She is refusing to consider Insulin.     Education Materials given:  None at this visit    Plan of Care:  Continue monthly monitoring      Lori Grieve RN, Chief Technology Officer

## 2013-12-17 NOTE — Progress Notes (Signed)
Chief Complaint   Patient presents with   ??? Diabetes     5 week follow up.     Past Medical History   Diagnosis Date   ??? Type II or unspecified type diabetes mellitus without mention of complication, not stated as uncontrolled ([place])    ??? Anxiety    ??? Hypertension    ??? Cancer ([place])      breast   ??? 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 Right knee pain 01/07/2012   ??? Colon polyp      Past Surgical History   Procedure Laterality Date   ??? Cholecystectomy     ??? Tonsillectomy  age 48   ??? Breast lumpectomy  2007   ??? Colonoscopy  09/18/12     [name]     Allergies   Allergen Reactions   ??? Morphine    ??? Oxycontin [Oxycodone]      History     Social History   ??? Marital Status: Married     Spouse Name: N/A     Number of Children: N/A   ??? Years of Education: N/A     Occupational History   ??? Not on file.     Social History Main Topics   ??? Smoking status: Never Smoker    ??? Smokeless tobacco: Never Used   ??? Alcohol Use: Yes      Comment: occ wine   ??? Drug Use: No   ??? Sexual Activity: Not on file     Other Topics Concern   ??? Not on file     Social History Narrative     Family History   Problem Relation Age of Onset   ??? High Blood Pressure Mother    ??? High Blood Pressure Father      Current Outpatient Prescriptions on File Prior to Visit   Medication Sig Dispense Refill   ??? sitaGLIPtin (JANUVIA) 100 MG tablet TAKE 1 TABLET BY MOUTH EVERY DAY.  90 tablet  1   ??? esomeprazole (NEXIUM) 40 MG capsule TAKE 1 CAPSULE BY MOUTH EVERY MORNING (BEFORE BREAKFAST).  90 capsule  1   ??? valsartan (DIOVAN) 80 MG tablet Take 1 tablet by mouth daily  90 tablet  1   ??? metFORMIN ER (GLUCOPHAGE-XR) 500 MG XR tablet TAKE 1 TABLET BY MOUTH 4 TIMES DAILY.  360 tablet  1   ??? alendronate (FOSAMAX) 70 MG tablet TAKE 1 TABLET BY MOUTH ONCE A WEEK  12 tablet  1   ??? atorvastatin (LIPITOR) 10 MG tablet TAKE 1 TABLET BY MOUTH EVERY DAY.  90 tablet  1   ??? glimepiride (AMARYL) 4 MG tablet TAKE 2 TABLETS BY MOUTH EVERY DAY  180 tablet  1   ??? amLODIPine  (NORVASC) 10 MG tablet TAKE 1 TABLET BY MOUTH EVERY DAY.  90 tablet  1   ??? dapagliflozin (FARXIGA) 10 MG tablet qd  90 tablet  1   ??? BAYER MICROLET LANCETS MISC Once daily  100 each  1   ??? Blood Glucose Monitoring Suppl (BAYER BREEZE 2 SYSTEM) W/DEVICE KIT by Does not apply route.  3 kit  0   ??? aspirin (ASPIRIN CHILDRENS) 81 MG chewable tablet Take 1 tablet by mouth daily.  90 tablet  3     No current facility-administered medications on file prior to visit.           Subjective  Lori Chase, 73 y.o. female  Diabetes  She presents for  her follow-up diabetic visit. She has type 2 diabetes mellitus. Her disease course has been stable. Pertinent negatives for hypoglycemia include no confusion, dizziness, headaches, hunger, mood changes, nervousness/anxiousness, sleepiness or speech difficulty. Pertinent negatives for diabetes include no blurred vision, no chest pain, no fatigue, no foot ulcerations, no visual change and no weakness. Pertinent negatives for hypoglycemia complications include no blackouts. Symptoms are stable. Current diabetic treatments: See med list. She is compliant with treatment all of the time. Her weight is stable. She is following a generally healthy diet. She participates in exercise intermittently.         Review of Systems   Constitutional: Negative for fatigue.   Eyes: Negative for blurred vision.   Cardiovascular: Negative for chest pain.   Neurological: Negative for dizziness, speech difficulty, weakness and headaches.   Psychiatric/Behavioral: Negative for confusion. The patient is not nervous/anxious.            Objective    Filed Vitals:    12/17/13 1447   BP: 124/78   Pulse: 64   Temp: 97.7 ??F (36.5 ??C)   TempSrc: Temporal   Resp: 20   Height: 5' 6"  (1.676 m)   Weight: 148 lb 6.4 oz (67.314 kg)     Physical Exam      PHYSICAL EXAMINATION:   VITAL SIGNS: are as recorded.  GENERAL:  The patient appears well nourished and well-developed, and with normal affect.  No acute respiratory  distress. Alert and oriented times 3. No skin rashes.     HEENT:  TMs normal bilaterally.  Canals and ears normal. Pharynx is clear. Extraocular eye motions intact and pain free.   Pupils reactive and equally round.  Sclerae and conjunctivae clear, normocephalic and atraumatic.      RESPIRATORY:  Clear and equal breath sounds with no acute respiratory distress.       HEART: Regular rhythm without murmur, rub or gallop.     ABDOMEN:  soft, nontender. No masses, guarding or rebound. Normoactive bowel sounds.     LOW BACK: No tenderness to palpation of inferior lumbar spine or either sacroiliac joint area.     OA B knees L > R      Assessment & Plan   1. OA (osteoarthritis) of knee  PR ARTHROCENTESIS ASPIR&/INJ MAJOR JT/BURSA W/O [place]    PR TRIAMCINOLONE ACETONIDE INJ   2. Type II or unspecified type diabetes mellitus without mention of complication, not stated as uncontrolled     3. DM type 2 with diabetic dyslipidemia ([facility])  Comprehensive Metabolic Panel    Lipid panel    Hemoglobin A1c   4. OA (osteoarthritis)     5. Symptomatic menopausal or female climacteric states     6. Other malaise and fatigue  CBC    CBC Auto Differential     Orders Placed This Encounter   Procedures   ??? CBC   ??? Comprehensive Metabolic Panel   ??? Lipid panel     Order Specific Question:  Is Patient Fasting?/# of Hours     Answer:  N/A   ??? Hemoglobin A1c   ??? CBC Auto Differential   ??? PR ARTHROCENTESIS ASPIR&/INJ MAJOR JT/BURSA W/O [place]   ??? PR TRIAMCINOLONE ACETONIDE INJ     80 mg     No orders of the defined types were placed in this encounter.     There are no discontinued medications.  Health Maintenance Due   Topic Date Due   ??? DIABETES-POOR CONTROL (A1c >=  8)  1941/05/07   ??? TETANUS VACCINE ADULT (11 YEARS AND UP)  05/24/2012   ??? BREAST CANCER SCREENING  01/15/2014   ??? FLU VACCINE YEARLY (ADULT)  01/22/2014     Risks were explained.  Consent was obtained.  Joint was injected with Kenalog & Lidocaine.  Pt tolerated procedure well.  There were no  complications. L knee    See BW    Return in about 4 weeks (around 01/14/2014).    Meryle Ready, MD

## 2013-12-18 LAB — COMPREHENSIVE METABOLIC PANEL
ALT: 24 U/L (ref 0–33)
AST: 18 U/L (ref 0–35)
Albumin: 4.4 g/dL (ref 3.9–4.9)
Alkaline Phosphatase: 59 U/L (ref 40–130)
Anion Gap: 16 mEq/L — ABNORMAL HIGH (ref 7–13)
BUN: 13 mg/dL (ref 8–23)
CO2: 24 mEq/L (ref 22–29)
Calcium: 10.1 mg/dL (ref 8.6–10.2)
Chloride: 97 mEq/L — ABNORMAL LOW (ref 98–107)
Creatinine: 0.61 mg/dL (ref 0.50–0.90)
GFR African American: >60 (ref >60)
GFR Non-African American: >60 (ref >60)
Globulin: 3.4 g/dL (ref 2.3–3.5)
Glucose: 148 mg/dL — ABNORMAL HIGH (ref 74–109)
Potassium: 4.5 mEq/L (ref 3.5–5.1)
Sodium: 137 mEq/L (ref 132–144)
Total Bilirubin: 0.2 mg/dL (ref 0.0–1.2)
Total Protein: 7.8 g/dL (ref 6.4–8.1)

## 2013-12-18 LAB — CBC WITH DIFFERENTIAL
Basophils %: 0.6 %
Basophils Absolute: 0.1 10*3/uL (ref 0.0–0.2)
Eosinophils %: 3.2 %
Eosinophils Absolute: 0.4 10*3/uL (ref 0.0–0.7)
Hematocrit: 34.9 % — ABNORMAL LOW (ref 37.0–47.0)
Hemoglobin: 10.5 g/dL — ABNORMAL LOW (ref 12.0–16.0)
Lymphocytes %: 24.4 %
Lymphocytes Absolute: 2.9 10*3/uL (ref 1.0–4.8)
MCH: 22.2 pg — ABNORMAL LOW (ref 27.0–31.3)
MCHC: 30.1 % — ABNORMAL LOW (ref 33.0–37.0)
MCV: 73.6 fL — ABNORMAL LOW (ref 82.0–100.0)
MPV: 10.9 fL — ABNORMAL HIGH (ref 7.4–10.4)
Monocytes %: 8.3 %
Monocytes Absolute: 1 10*3/uL — ABNORMAL HIGH (ref 0.2–0.8)
Neutrophils %: 63.5 %
Neutrophils Absolute: 7.6 10*3/uL — ABNORMAL HIGH (ref 1.4–6.5)
PLATELET SLIDE REVIEW: NORMAL
Platelets: 328 10*3/uL (ref 130–400)
RBC: 4.74 M/uL (ref 4.20–5.40)
RDW: 18.3 % — ABNORMAL HIGH (ref 11.5–14.5)
WBC: 12 10*3/uL — ABNORMAL HIGH (ref 4.8–10.8)

## 2013-12-18 LAB — LIPID PANEL
Cholesterol, Total: 149 mg/dL (ref 0–199)
HDL: 45 mg/dL (ref 40–59)
LDL Calculated: 44 mg/dL (ref 0–129)
Triglycerides: 298 mg/dL — ABNORMAL HIGH (ref 0–200)

## 2013-12-18 LAB — HEMOGLOBIN A1C: Hemoglobin A1C: 8.3 % — ABNORMAL HIGH (ref 4.8–5.9)

## 2013-12-18 NOTE — Care Coordination-Inpatient (Signed)
Lori Chase  12/18/2013                Nurse Care Coordinator Progress Note    Lori Chase is a 73 y.o. female seen in the office today for follow up patient education/ health coaching.     Overall goals:  Goals    ??? Blood sugars BID, log and bring to office.      ??? HEMOGLOBIN A1C < 7.0     ??? LDL CALC < 130     ??? Omit white flour pasta, rice and bread.     ??? Portion control           Assessment/ Progress:  Met with Lori Chase at dr visit. She reports her blood sugar is in the 170's. Her [place] has been increased, but her blood sugar has not decreased. She continues to monitor carbohydrate intake. 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

## 2014-01-21 NOTE — Progress Notes (Signed)
Chief Complaint   Patient presents with   ??? Arthritis     pt is here for 4 week  f/u on OA   ??? Diabetes   ??? Hyperlipidemia     Past Medical History   Diagnosis Date   ??? Type II or unspecified type diabetes mellitus without mention of complication, not stated as uncontrolled ([place])    ??? Anxiety    ??? Hypertension    ??? Cancer ([place])      breast   ??? 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 Right knee pain 01/07/2012   ??? Colon polyp      Past Surgical History   Procedure Laterality Date   ??? Cholecystectomy     ??? Tonsillectomy  age 79   ??? Breast lumpectomy  2007   ??? Colonoscopy  09/18/12     [name]     Allergies   Allergen Reactions   ??? Morphine    ??? Oxycontin [Oxycodone]      History     Social History   ??? Marital Status: Married     Spouse Name: N/A     Number of Children: N/A   ??? Years of Education: N/A     Occupational History   ??? Not on file.     Social History Main Topics   ??? Smoking status: Never Smoker    ??? Smokeless tobacco: Never Used   ??? Alcohol Use: Yes      Comment: occ wine   ??? Drug Use: No   ??? Sexual Activity: Not on file     Other Topics Concern   ??? Not on file     Social History Narrative     Family History   Problem Relation Age of Onset   ??? High Blood Pressure Mother    ??? High Blood Pressure Father      Current Outpatient Prescriptions on File Prior to Visit   Medication Sig Dispense Refill   ??? sitaGLIPtin (JANUVIA) 100 MG tablet TAKE 1 TABLET BY MOUTH EVERY DAY.  90 tablet  1   ??? esomeprazole (NEXIUM) 40 MG capsule TAKE 1 CAPSULE BY MOUTH EVERY MORNING (BEFORE BREAKFAST).  90 capsule  1   ??? valsartan (DIOVAN) 80 MG tablet Take 1 tablet by mouth daily  90 tablet  1   ??? metFORMIN ER (GLUCOPHAGE-XR) 500 MG XR tablet TAKE 1 TABLET BY MOUTH 4 TIMES DAILY.  360 tablet  1   ??? alendronate (FOSAMAX) 70 MG tablet TAKE 1 TABLET BY MOUTH ONCE A WEEK  12 tablet  1   ??? atorvastatin (LIPITOR) 10 MG tablet TAKE 1 TABLET BY MOUTH EVERY DAY.  90 tablet  1   ??? glimepiride (AMARYL) 4 MG tablet TAKE 2 TABLETS BY MOUTH  EVERY DAY  180 tablet  1   ??? amLODIPine (NORVASC) 10 MG tablet TAKE 1 TABLET BY MOUTH EVERY DAY.  90 tablet  1   ??? dapagliflozin (FARXIGA) 10 MG tablet qd  90 tablet  1   ??? BAYER MICROLET LANCETS MISC Once daily  100 each  1   ??? Blood Glucose Monitoring Suppl (BAYER BREEZE 2 SYSTEM) W/DEVICE KIT by Does not apply route.  3 kit  0   ??? aspirin (ASPIRIN CHILDRENS) 81 MG chewable tablet Take 1 tablet by mouth daily.  90 tablet  3     No current facility-administered medications on file prior to visit.  Subjective  Lori Chase, 73 y.o. female  Diabetes  She has type 2 diabetes mellitus. Her disease course has been stable. There are no hypoglycemic associated symptoms. Pertinent negatives for diabetes include no blurred vision, no chest pain, no fatigue, no foot paresthesias, no foot ulcerations, no polydipsia, no polyphagia, no polyuria, no visual change and no weight loss. There are no hypoglycemic complications. Symptoms are stable. There are no diabetic complications. Risk factors for coronary artery disease include diabetes mellitus, dyslipidemia and hypertension. Current diabetic treatment includes oral agent (triple therapy). She is compliant with treatment all of the time. She is following a diabetic and high fat/cholesterol diet. When asked about meal planning, she reported none. She has not had a previous visit with a dietitian. She participates in exercise intermittently. Her breakfast blood glucose is taken between 8-9 am. Her breakfast blood glucose range is generally 180-200 mg/dl. She does not see a podiatrist.Eye exam is current.   Hyperlipidemia  This is a chronic problem. The problem is controlled. Exacerbating diseases include diabetes. She has no history of hypothyroidism, liver disease, obesity or nephrotic syndrome. There are no known factors aggravating her hyperlipidemia. Pertinent negatives include no chest pain, focal sensory loss, leg pain, myalgias or shortness of breath. Current  antihyperlipidemic treatment includes statins. The current treatment provides mild improvement of lipids. There are no compliance problems.  There are no known risk factors for coronary artery disease.         Review of Systems   Constitutional: Negative for weight loss and fatigue.   Eyes: Negative for blurred vision.   Respiratory: Negative for shortness of breath.    Cardiovascular: Negative for chest pain.   Endocrine: Negative for polydipsia, polyphagia and polyuria.   Musculoskeletal: Negative for myalgias.           Objective    Filed Vitals:    01/21/14 1357   BP: 140/80   Pulse: 87   Temp: 98.2 ??F (36.8 ??C)   TempSrc: Tympanic   Height: 5' 6"  (1.676 m)   Weight: 143 lb (64.864 kg)   SpO2: 97%     Physical Exam      PHYSICAL EXAMINATION:   VITAL SIGNS: are as recorded.  GENERAL:  The patient appears well nourished and well-developed, and with normal affect.  No acute respiratory distress. Alert and oriented times 3. No skin rashes.     HEENT:  TMs normal bilaterally.  Canals and ears normal. Pharynx is clear. Extraocular eye motions intact and pain free.   Pupils reactive and equally round.  Sclerae and conjunctivae clear, normocephalic and atraumatic.      RESPIRATORY:  Clear and equal breath sounds with no acute respiratory distress.       HEART: Regular rhythm without murmur, rub or gallop.     ABDOMEN:  soft, nontender. No masses, guarding or rebound. Normoactive bowel sounds.     LOW BACK: No tenderness to palpation of inferior lumbar spine or either sacroiliac joint area.           Assessment & Plan   1. Essential hypertension    2. DM type 2 with diabetic dyslipidemia ([place])    3. Gastroesophageal reflux disease with esophagitis    4. Osteopenia    5. Primary osteoarthritis of both knees      No orders of the defined types were placed in this encounter.     No orders of the defined types were placed in this encounter.  There are no discontinued medications.  Health Maintenance Due   Topic Date Due    ??? DIABETES-POOR CONTROL (A1c >=8)  1941-03-03   ??? TETANUS VACCINE ADULT (11 YEARS AND UP)  05/24/2012   ??? PNEUMO VAC >= 65 (2 of 2 - PCV13) 12/08/2012   ??? BREAST CANCER SCREENING  01/15/2014   ??? FLU VACCINE YEARLY (ADULT)  01/22/2014     Keep losing wt    Return in about 4 weeks (around 02/18/2014).    Meryle Ready, MD

## 2014-01-22 NOTE — Care Coordination-Inpatient (Signed)
BELINDA SCHLICHTING  01/22/2014                Nurse Care Coordinator Progress Note    Lori Chase is a 73 y.o. female seen in the office today for follow up patient education/ health coaching.     Overall goals:  Goals    ??? Blood sugars BID, log and bring to office.      ??? HEMOGLOBIN A1C < 7.0     ??? LDL CALC < 130     ??? Omit white flour pasta, rice and bread.     ??? Portion control           Assessment/ Progress:  Met with [place] at dr visit. She reports her blood sugar is consistently above 170 and is aware that is too high. She closely monitors carbohydrate intake. She will discuss with Dr the need to change medication regime    Education Materials given:  None at this visit    Plan of Care:  Continue monthly monitoring      Alphonzo Grieve RN, BSN  Nurse Care Coordinator

## 2014-02-27 ENCOUNTER — Ambulatory Visit: Admit: 2014-02-27 | Discharge: 2014-02-27 | Payer: MEDICARE | Attending: Family Medicine | Primary: Family Medicine

## 2014-02-27 DIAGNOSIS — I10 Essential (primary) hypertension: Secondary | ICD-10-CM

## 2014-02-27 NOTE — Progress Notes (Signed)
Chief Complaint   Patient presents with   ??? Diabetes     patient here for a follow up and medication refill   ??? Hypertension     patient here for a follow up and medication refill   ??? Hyperlipidemia     patient here for a follow up and medication refill   ??? Flu Vaccine     Past Medical History   Diagnosis Date   ??? Type II or unspecified type diabetes mellitus without mention of complication, not stated as uncontrolled ([place])    ??? Anxiety    ??? Hypertension    ??? Cancer ([place])      breast   ??? 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 Right knee pain 01/07/2012   ??? Colon polyp      Past Surgical History   Procedure Laterality Date   ??? Cholecystectomy     ??? Tonsillectomy  age 77   ??? Breast lumpectomy  2007   ??? Colonoscopy  09/18/12     [name]     Allergies   Allergen Reactions   ??? Morphine    ??? Oxycontin [Oxycodone]      History     Social History   ??? Marital Status: Married     Spouse Name: N/A     Number of Children: N/A   ??? Years of Education: N/A     Occupational History   ??? Not on file.     Social History Main Topics   ??? Smoking status: Never Smoker    ??? Smokeless tobacco: Never Used   ??? Alcohol Use: Yes      Comment: occ wine   ??? Drug Use: No   ??? Sexual Activity: Not on file     Other Topics Concern   ??? Not on file     Social History Narrative     Family History   Problem Relation Age of Onset   ??? High Blood Pressure Mother    ??? High Blood Pressure Father      Current Outpatient Prescriptions on File Prior to Visit   Medication Sig Dispense Refill   ??? sitaGLIPtin (JANUVIA) 100 MG tablet TAKE 1 TABLET BY MOUTH EVERY DAY. 90 tablet 1   ??? esomeprazole (NEXIUM) 40 MG capsule TAKE 1 CAPSULE BY MOUTH EVERY MORNING (BEFORE BREAKFAST). 90 capsule 1   ??? valsartan (DIOVAN) 80 MG tablet Take 1 tablet by mouth daily 90 tablet 1   ??? metFORMIN ER (GLUCOPHAGE-XR) 500 MG XR tablet TAKE 1 TABLET BY MOUTH 4 TIMES DAILY. 360 tablet 1   ??? alendronate (FOSAMAX) 70 MG tablet TAKE 1 TABLET BY MOUTH ONCE A WEEK 12 tablet 1   ???  atorvastatin (LIPITOR) 10 MG tablet TAKE 1 TABLET BY MOUTH EVERY DAY. 90 tablet 1   ??? glimepiride (AMARYL) 4 MG tablet TAKE 2 TABLETS BY MOUTH EVERY DAY 180 tablet 1   ??? amLODIPine (NORVASC) 10 MG tablet TAKE 1 TABLET BY MOUTH EVERY DAY. 90 tablet 1   ??? dapagliflozin (FARXIGA) 10 MG tablet qd 90 tablet 1   ??? BAYER MICROLET LANCETS MISC Once daily 100 each 1   ??? Blood Glucose Monitoring Suppl (BAYER BREEZE 2 SYSTEM) W/DEVICE KIT by Does not apply route. 3 kit 0   ??? aspirin (ASPIRIN CHILDRENS) 81 MG chewable tablet Take 1 tablet by mouth daily. 90 tablet 3     No current facility-administered medications on file prior to visit.  Subjective  Lori Chase, 73 y.o. female  Diabetes  She presents for her follow-up diabetic visit. She has type 2 diabetes mellitus. No MedicAlert identification noted. Her disease course has been stable. There are no hypoglycemic associated symptoms. Pertinent negatives for hypoglycemia include no dizziness. There are no diabetic associated symptoms. Pertinent negatives for diabetes include no chest pain. There are no hypoglycemic complications. Symptoms are stable. There are no diabetic complications. Pertinent negatives for diabetic complications include no CVA, PVD or retinopathy. Risk factors for coronary artery disease include diabetes mellitus and hypertension. Current diabetic treatment includes oral agent (monotherapy). She is compliant with treatment all of the time. Her weight is stable. She is following a generally healthy diet. When asked about meal planning, she reported none. She has not had a previous visit with a dietitian. She participates in exercise weekly. There is no change in her home blood glucose trend. An ACE inhibitor/angiotensin II receptor blocker is being taken. She does not see a podiatrist.Eye exam is current.   Hypertension  This is a chronic problem. The current episode started more than 1 year ago. The problem is unchanged. The problem is  controlled. Pertinent negatives include no chest pain or shortness of breath. There are no associated agents to hypertension. Risk factors for coronary artery disease include diabetes mellitus and female gender. Past treatments include ACE inhibitors. The current treatment provides moderate improvement. There are no compliance problems.  There is no history of angina, kidney disease, CAD/MI, CVA, heart failure, left ventricular hypertrophy, PVD, renovascular disease, retinopathy or a thyroid problem. There is no history of chronic renal disease, coarctation of the aorta, hyperaldosteronism, hypercortisolism, hyperparathyroidism, a hypertension causing med, pheochromocytoma or sleep apnea.   Hyperlipidemia  This is a chronic problem. The current episode started more than 1 year ago. The problem is controlled. Recent lipid tests were reviewed and are normal. She has no history of chronic renal disease, diabetes, hypothyroidism, liver disease, obesity or nephrotic syndrome. There are no known factors aggravating her hyperlipidemia. Pertinent negatives include no chest pain, focal sensory loss, focal weakness, leg pain, myalgias or shortness of breath. Current antihyperlipidemic treatment includes statins. The current treatment provides no improvement of lipids. There are no compliance problems.  Risk factors for coronary artery disease include diabetes mellitus and hypertension.     Sugars improving    Vaccine Information Sheet, "Influenza - Inactivated" OR "Live - Intranasal"  given to Lori Chase, or parent/legal guardian of  LUCIENNE SAWYERS and verbalized understanding.    Patient responses:    Have you ever had a reaction to a flu vaccine? No  Are you able to eat eggs without adverse effects?  Yes  Do you have any current illness?  No  Have you ever had Guillian Barre Syndrome?  No    Flu vaccine given per order. Please see immunization tab.                Review of Systems   Constitutional: Negative.  Negative  for activity change.   HENT: Negative.  Negative for congestion.    Eyes: Negative.  Negative for discharge.   Respiratory: Negative.  Negative for apnea and shortness of breath.    Cardiovascular: Negative.  Negative for chest pain.   Gastrointestinal: Negative.  Negative for constipation and abdominal distention.   Endocrine: Negative.  Negative for cold intolerance.   Genitourinary: Negative.  Negative for difficulty urinating.   Musculoskeletal: Positive for arthralgias. Negative for myalgias.  Neurological: Negative.  Negative for dizziness and focal weakness.           Objective    Filed Vitals:    02/27/14 1326   BP: 138/68   Pulse: 99   Temp: 98.5 ??F (36.9 ??C)   Resp: 16   Height: 5' 7"  (1.702 m)   Weight: 143 lb 9.6 oz (65.137 kg)   SpO2: 95%     Physical Exam      PHYSICAL EXAMINATION:   VITAL SIGNS: are as recorded.  GENERAL:  The patient appears well nourished and well-developed, and with normal affect.  No acute respiratory distress. Alert and oriented times 3. No skin rashes.     HEENT:  TMs normal bilaterally.  Canals and ears normal. Pharynx is clear. Extraocular eye motions intact and pain free.   Pupils reactive and equally round.  Sclerae and conjunctivae clear, normocephalic and atraumatic.      RESPIRATORY:  Clear and equal breath sounds with no acute respiratory distress.       HEART: Regular rhythm without murmur, rub or gallop.     ABDOMEN:  soft, nontender. No masses, guarding or rebound. Normoactive bowel sounds.     LOW BACK: No tenderness to palpation of inferior lumbar spine or either sacroiliac joint area.           Assessment & Plan   1. Essential hypertension     2. Flu vaccine need  FLU VACCINE HIGH DOSE (FLUZONE HIGH-DOSE) IM   3. DM type 2 with diabetic dyslipidemia ([place])     4. Hyperlipidemia       Orders Placed This Encounter   Procedures   ??? FLU VACCINE HIGH DOSE (FLUZONE HIGH-DOSE) IM     No orders of the defined types were placed in this encounter.     There are no  discontinued medications.  Health Maintenance Due   Topic Date Due   ??? DIABETES-POOR CONTROL (A1c >=8)  1941-02-23   ??? TETANUS VACCINE ADULT (11 YEARS AND UP)  05/24/2012   ??? PNEUMO VAC >= 65 (2 of 2 - PCV13) 12/08/2012   ??? HEMOGLOBIN A1C  03/19/2014     BW next    See BW     Htn,dm,lipids stable    Return in about 4 weeks (around 03/27/2014).    Ignacia Felling, MD

## 2014-02-28 NOTE — Care Coordination-Inpatient (Signed)
Lori Chase  02/27/2014                Nurse Care Coordinator Progress Note    Lori Chase is a 73 y.o. female seen in the office today for follow up patient education/ health coaching.     Overall goals:  Goals     ??? Blood sugars BID, log and bring to office.      ??? HEMOGLOBIN A1C < 7.0     ??? LDL CALC < 130     ??? Omit white flour pasta, rice and bread.     ??? Portion control           Assessment/ Progress:  Met with Lori Chase at dr visit. Her blood sugar today 130. She reports her blood sugar has been lower since starting the [place]. She continues to monitor her carbohydrate intake    Education Materials given:  None at this visit    Plan of Care:  Continue monthly monitoring      Alphonzo Grieve RN, BSN  Nurse Care Coordinator

## 2014-03-27 ENCOUNTER — Encounter: Attending: Family Medicine | Primary: Family Medicine

## 2014-04-03 ENCOUNTER — Ambulatory Visit: Admit: 2014-04-03 | Discharge: 2014-04-03 | Payer: MEDICARE | Attending: Family Medicine | Primary: Family Medicine

## 2014-04-03 DIAGNOSIS — E785 Hyperlipidemia, unspecified: Secondary | ICD-10-CM

## 2014-04-03 NOTE — Patient Instructions (Signed)
Diabetes Management    Blood Sugar Log  Day Breakfast Lunch Dinner    Before 2 hrs After Before 2 hrs After Before 2 hrs After    Time Number Time  Number Time Number Time Number Time  Number Time Number   Sun               Mon               Tues               Wed               Thurs               Fri               Sat                 1. Keep your blood sugar level (A1c) below 7.0%  ??? If your blood sugar is ABOVE 7.0%, get an A1c test every 3 months.   ??? If your blood sugar is BELOW 7.0%, get an A1c test every 6 months.    2. Keep your blood pressure below 130/80  ??? Make sure your blood pressure is measured at every office visit    3. Obtain a foot exam once a year during your doctor???s visit; also, do foot exam on yourself every week or have someone do it for you     4. Keep your LDL (bad) cholesterol level below 100  ??? Make sure your lipids are measured once a year     5. Obtain a urine test (microalbumin) once a year    6. Obtain an eye exam once a year    7. Learn to stay healthy living with Diabetes  ??? Attend Diabetes Education course if ordered by your physician    8. Exercise and slowly progress to (brisk walking, bike riding, etc) 30 minutes 3 to 5 days a week.    9. Obtain annual flu shot    10. Obtain pneumonia shot if you have not done so  11. Refer to patient education handouts given to you today    Diabetes Management Community Resources   Organization Address Program Phone / Website   [facility] [address] [place] [address] Diabetes Self-Management  [phone]   [facility] [address] [place], [place] Diabetes Self-Management  [phone]   [facility] [place] [address] [place], [place]  Diabetes Self-Management  [phone]   [facility] [address] [place] [address] Help with Food [phone]   Partnership for Prescription Assistance n/a Medications [phone]

## 2014-04-03 NOTE — Progress Notes (Signed)
Lori Chase 73 y.o. female presents today with   Chief Complaint   Patient presents with   ??? Annual Exam   ??? Diabetes   ??? Cough     Treatment Adherence:   Medication compliance:  compliant all of the time  Diet compliance:  compliant all of the time  Weight trend: stable  Current exercise: walks 3 time(s) per week    Diabetes Mellitus Type 2: Current symptoms/problems include none.    Home blood sugar records:  fasting range: 135  Any episodes of hypoglycemia? no  Eye exam current (within one year): yes  Tobacco history: Lori Chase  reports that Lori Chase has never smoked. Lori Chase has never used smokeless tobacco.   Daily Aspirin? Yes  Known diabetic complications: none    Hypertension:  Home blood pressure monitoring: Yes - .  Lori Chase is adherent to a low sodium diet. Patient denies chest pain, shortness of breath, headache, lightheadedness, blurred vision, peripheral edema, palpitations, dry cough and fatigue.  Antihypertensive medication side effects: no medication side effects noted.  Use of agents associated with hypertension: none.     Hyperlipidemia:  No new myalgias or GI upset on atorvastatin (Lipitor).       Lab Results   Component Value Date    LABA1C 7.4* 04/03/2014    LABA1C 8.3* 12/17/2013    LABA1C 8.2* 09/05/2013     Lab Results   Component Value Date    LABMICR 1.20 04/09/2013    CREATININE 0.64 04/03/2014     Lab Results   Component Value Date    ALT 19 04/03/2014    AST 20 04/03/2014     Lab Results   Component Value Date    CHOL 141 04/03/2014    TRIG 233* 04/03/2014    HDL 44 04/03/2014    LDLCALC 50 04/03/2014              HPI Comments: Patient is here for an annual exam.  Cough  This is a new problem. The current episode started 1 to 4 weeks ago. The problem has been gradually improving. The problem occurs every few hours. The cough is non-productive. Associated symptoms include nasal congestion. Pertinent negatives include no chest pain, chills, ear congestion, ear pain, fever, headaches, heartburn,  hemoptysis, myalgias, postnasal drip, rash, rhinorrhea, sore throat, shortness of breath, sweats, weight loss or wheezing. Nothing aggravates the symptoms. Lori Chase has tried nothing for the symptoms. The treatment provided mild relief.     Fu dm lipids htn  Past Medical History   Diagnosis Date   ??? Type II or unspecified type diabetes mellitus without mention of complication, not stated as uncontrolled ([place])    ??? Anxiety    ??? Hypertension    ??? Cancer ([place])      breast   ??? 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 Right knee pain 01/07/2012   ??? Colon polyp      Patient Active Problem List    Diagnosis Date Noted   ??? Other and unspecified hyperlipidemia 04/03/2014   ??? Primary osteoarthritis of both knees 01/21/2014   ??? DM type 2 with diabetic dyslipidemia ([place]) 10/10/2013   ??? Cataract 09/24/2013   ??? OA (osteoarthritis) of knee 07/09/2013   ??? Abnormal mammogram with microcalcification 02/02/2013   ??? Leg cramping 04/26/2012   ??? 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 OA (osteoarthritis) 12/09/2011   ??? Symptomatic menopausal or female climacteric states 07/26/2011   ??? Breast cancer 03/01/2011   ???  GERD (gastroesophageal reflux disease) 11/04/2010   ??? Osteopenia 11/04/2010   ??? HIGH CHOLESTEROL 07/16/2010   ??? Type II or unspecified type diabetes mellitus without mention of complication, not stated as uncontrolled    ??? Hypertension    ??? History of malignant neoplasm of breast 12/29/2005     Past Surgical History   Procedure Laterality Date   ??? Cholecystectomy     ??? Tonsillectomy  age 5   ??? Breast lumpectomy  2007   ??? Colonoscopy  09/18/12     [name]     Family History   Problem Relation Age of Onset   ??? High Blood Pressure Mother    ??? High Blood Pressure Father      History     Social History   ??? Marital Status: Married     Spouse Name: N/A     Number of Children: N/A   ??? Years of Education: N/A     Social History Main Topics   ??? Smoking status: Never Smoker    ??? Smokeless tobacco: Never Used   ??? Alcohol Use: Yes       Comment: occ wine   ??? Drug Use: No   ??? Sexual Activity: None     Other Topics Concern   ??? None     Social History Narrative     Allergies   Allergen Reactions   ??? Morphine    ??? Oxycontin [Oxycodone]        Review of Systems   Constitutional: Negative for fever, chills and weight loss.   HENT: Negative for ear pain, postnasal drip, rhinorrhea and sore throat.    Respiratory: Positive for cough. Negative for hemoptysis, shortness of breath and wheezing.    Cardiovascular: Negative for chest pain.   Gastrointestinal: Negative for heartburn.   Musculoskeletal: Negative for myalgias.   Skin: Negative for rash.   Neurological: Negative for headaches.           Filed Vitals:    04/03/14 1346   BP: 130/60   Pulse: 88   Temp: 97.8 ??F (36.6 ??C)   TempSrc: Tympanic   Resp: 16   Height: 5\' 7"  (1.702 m)   Weight: 143 lb (64.864 kg)       Physical Exam      PHYSICAL EXAMINATION:   VITAL SIGNS: are as recorded.  GENERAL:  The patient appears well nourished and well-developed, and with normal affect.  No acute respiratory distress. Alert and oriented times 3. No skin rashes.     HEENT:  TMs normal bilaterally.  Canals and ears normal. Pharynx is clear. Extraocular eye motions intact and pain free.   Pupils reactive and equally round.  Sclerae and conjunctivae clear, normocephalic and atraumatic.      RESPIRATORY:  Clear and equal breath sounds with no acute respiratory distress.       HEART: Regular rhythm without murmur, rub or gallop.     ABDOMEN:  soft, nontender. No masses, guarding or rebound. Normoactive bowel sounds.     LOW BACK: No tenderness to palpation of inferior lumbar spine or either sacroiliac joint area.         Assessment/Plan  Lori Chase was seen today for annual exam, diabetes and cough.    Diagnoses and associated orders for this visit:    DM type 2 with diabetic dyslipidemia ([place])  - Comprehensive Metabolic Panel  - Lipid Panel  - Hemoglobin A1C    Gastroesophageal reflux disease with esophagitis  Essential  hypertension    Other and unspecified hyperlipidemia  - Lipid Panel    Other malaise and fatigue  - CBC Auto Differential    Need for pneumococcal vaccination    Other Orders  - Pneumococcal conjugate vaccine 13-valent IM (PREVNAR 13)            Health Maintenance Due   Topic Date Due   ??? TETANUS VACCINE ADULT (11 YEARS AND UP)  05/24/2012   ??? FOOT EXAM  04/09/2014   ??? OPHTHALMOLOGY EXAM  04/09/2014   ??? DEPRESSION SCREENING  05/09/2014     Diabetes Counseling   Patient was again counseled regarding disease risks and need to adopt/ maintain a healthy lifestyle, proceed with self management in the home setting by keeping logs to record blood sugar as frequently as advised, blood pressure, caloric intake, weight and physical activity level. Patient was instructed to keep logs up-to-date and to always bring logs to all office visits.    Dx stable    Controlled Substances Monitoring:      Return in about 2 months (around 06/05/2014).    Ignacia Felling, MD

## 2014-04-03 NOTE — Care Coordination-Inpatient (Signed)
Lori Chase  04/03/2014                Nurse Care Coordinator Progress Note    Lori Chase is a 73 y.o. female seen in the office today for follow up patient education/ health coaching.     Overall goals:  Goals     ??? Blood sugars BID, log and bring to office.      ??? HEMOGLOBIN A1C < 7.0     ??? LDL CALC < 130     ??? Omit white flour pasta, rice and bread.     ??? Portion control           Assessment/ Progress:  Met with Lori Chase at dr visit. She reports she is feeling well at this time. Her blood sugar 130. She will have lab monitored today and I will call her with result of HbA1c    Education Materials given:  None at this visit    Plan of Care:  Call with result HbA1c and monitor monthly      [facility] RN, BSN  Nurse Care Coordinator

## 2014-04-04 LAB — COMPREHENSIVE METABOLIC PANEL
ALT: 19 U/L (ref 0–33)
AST: 20 U/L (ref 0–35)
Albumin: 4.1 g/dL (ref 3.9–4.9)
Alkaline Phosphatase: 57 U/L (ref 40–130)
Anion Gap: 18 mEq/L — ABNORMAL HIGH (ref 7–13)
BUN: 12 mg/dL (ref 8–23)
CO2: 21 mEq/L — ABNORMAL LOW (ref 22–29)
Calcium: 9.5 mg/dL (ref 8.6–10.2)
Chloride: 99 mEq/L (ref 98–107)
Creatinine: 0.64 mg/dL (ref 0.50–0.90)
GFR African American: >60 (ref >60)
GFR Non-African American: >60 (ref >60)
Globulin: 3.2 g/dL (ref 2.3–3.5)
Glucose: 197 mg/dL — ABNORMAL HIGH (ref 74–109)
Potassium: 5.2 mEq/L — ABNORMAL HIGH (ref 3.5–5.1)
Sodium: 138 mEq/L (ref 132–144)
Total Bilirubin: 0.1 mg/dL (ref 0.0–1.2)
Total Protein: 7.3 g/dL (ref 6.4–8.1)

## 2014-04-04 LAB — CBC WITH DIFFERENTIAL
Basophils %: 0.5 %
Basophils Absolute: 0.1 10*3/uL (ref 0.0–0.2)
Eosinophils %: 2.7 %
Eosinophils Absolute: 0.3 10*3/uL (ref 0.0–0.7)
Hematocrit: 35.9 % — ABNORMAL LOW (ref 37.0–47.0)
Hemoglobin: 10.4 g/dL — ABNORMAL LOW (ref 12.0–16.0)
Lymphocytes %: 20.6 %
Lymphocytes Absolute: 2.3 10*3/uL (ref 1.0–4.8)
MCH: 20.9 pg — ABNORMAL LOW (ref 27.0–31.3)
MCHC: 28.8 % — ABNORMAL LOW (ref 33.0–37.0)
MCV: 72.6 fL — ABNORMAL LOW (ref 82.0–100.0)
Macrocytes: 0
Monocytes %: 8.9 %
Monocytes Absolute: 1 10*3/uL — ABNORMAL HIGH (ref 0.2–0.8)
Neutrophils %: 67.3 %
Neutrophils Absolute: 7.6 10*3/uL — ABNORMAL HIGH (ref 1.4–6.5)
PLATELET SLIDE REVIEW: NORMAL
Platelets: 278 10*3/uL (ref 130–400)
Polychromasia: 0
RBC: 4.95 M/uL (ref 4.20–5.40)
RDW: 21.3 % — ABNORMAL HIGH (ref 11.5–14.5)
WBC: 11.3 10*3/uL — ABNORMAL HIGH (ref 4.8–10.8)

## 2014-04-04 LAB — HEMOGLOBIN A1C: Hemoglobin A1C: 7.4 % — ABNORMAL HIGH (ref 4.8–5.9)

## 2014-04-04 LAB — LIPID PANEL
Cholesterol, Total: 141 mg/dL (ref 0–199)
HDL: 44 mg/dL (ref 40–59)
LDL Calculated: 50 mg/dL (ref 0–129)
Triglycerides: 233 mg/dL — ABNORMAL HIGH (ref 0–200)

## 2014-04-08 NOTE — Care Coordination-Inpatient (Signed)
Called Lori Chase to congratulate on decrease of HbA1c. Message left to return call

## 2014-05-03 NOTE — Care Coordination-Inpatient (Signed)
Called Lori Chase to monitor progress. Lori Chase states she has good understanding of diabetic management. Lori Chase is in agreement with discharge from care coordination at this time ans is aware she can be admitted to care coordination program in the future if need arises

## 2014-05-13 MED ORDER — ESOMEPRAZOLE MAGNESIUM 40 MG PO CPDR
40 MG | ORAL_CAPSULE | ORAL | Status: DC
Start: 2014-05-13 — End: 2014-06-01

## 2014-05-13 NOTE — Telephone Encounter (Signed)
Last OV 04/03/2014

## 2014-05-13 NOTE — Telephone Encounter (Signed)
LAST VISIT 04/03/14

## 2014-05-14 MED ORDER — FARXIGA 10 MG PO TABS
10 MG | ORAL_TABLET | ORAL | Status: DC
Start: 2014-05-14 — End: 2014-06-01

## 2014-05-25 MED ORDER — VALSARTAN 80 MG PO TABS
80 MG | ORAL_TABLET | ORAL | Status: DC
Start: 2014-05-25 — End: 2014-06-01

## 2014-05-25 MED ORDER — GLIMEPIRIDE 4 MG PO TABS
4 MG | ORAL_TABLET | ORAL | Status: DC
Start: 2014-05-25 — End: 2014-06-01

## 2014-05-25 NOTE — Telephone Encounter (Signed)
Patient was last seen on 04/03/14.    Medication request is pending.  Please approve or deny this request.

## 2014-05-27 NOTE — Telephone Encounter (Signed)
Medication approved & sent through electronically.

## 2014-05-30 NOTE — Telephone Encounter (Signed)
Last OV 04/03/2014

## 2014-06-01 ENCOUNTER — Encounter

## 2014-06-01 MED ORDER — ATORVASTATIN CALCIUM 10 MG PO TABS
10 MG | ORAL_TABLET | ORAL | Status: DC
Start: 2014-06-01 — End: 2014-06-01

## 2014-06-01 MED ORDER — METFORMIN HCL ER 500 MG PO TB24
500 MG | ORAL_TABLET | ORAL | Status: DC
Start: 2014-06-01 — End: 2014-06-01

## 2014-06-01 NOTE — Telephone Encounter (Signed)
Last visit 04/03/14

## 2014-06-03 ENCOUNTER — Encounter

## 2014-06-03 MED ORDER — ALENDRONATE SODIUM 70 MG PO TABS
70 MG | ORAL_TABLET | ORAL | Status: DC
Start: 2014-06-03 — End: 2014-10-23

## 2014-06-03 MED ORDER — METFORMIN HCL ER 500 MG PO TB24
500 MG | ORAL_TABLET | ORAL | Status: DC
Start: 2014-06-03 — End: 2014-06-03

## 2014-06-03 MED ORDER — SITAGLIPTIN PHOSPHATE 100 MG PO TABS
100 MG | ORAL_TABLET | ORAL | Status: DC
Start: 2014-06-03 — End: 2014-06-03

## 2014-06-03 MED ORDER — AMLODIPINE BESYLATE 10 MG PO TABS
10 MG | ORAL_TABLET | ORAL | Status: DC
Start: 2014-06-03 — End: 2014-06-03

## 2014-06-03 MED ORDER — GLIMEPIRIDE 4 MG PO TABS
4 MG | ORAL_TABLET | ORAL | Status: DC
Start: 2014-06-03 — End: 2014-06-03

## 2014-06-03 MED ORDER — ESOMEPRAZOLE MAGNESIUM 40 MG PO CPDR
40 MG | ORAL_CAPSULE | ORAL | Status: DC
Start: 2014-06-03 — End: 2014-06-03

## 2014-06-03 MED ORDER — ALENDRONATE SODIUM 70 MG PO TABS
70 MG | ORAL_TABLET | ORAL | Status: DC
Start: 2014-06-03 — End: 2014-06-03

## 2014-06-03 MED ORDER — DAPAGLIFLOZIN PROPANEDIOL 10 MG PO TABS
10 MG | ORAL_TABLET | ORAL | Status: DC
Start: 2014-06-03 — End: 2014-06-03

## 2014-06-03 MED ORDER — VALSARTAN 80 MG PO TABS
80 MG | ORAL_TABLET | ORAL | Status: DC
Start: 2014-06-03 — End: 2014-06-03

## 2014-06-03 MED ORDER — ATORVASTATIN CALCIUM 10 MG PO TABS
10 MG | ORAL_TABLET | ORAL | Status: DC
Start: 2014-06-03 — End: 2014-06-03

## 2014-06-03 NOTE — Telephone Encounter (Signed)
Medication approved & sent through electronically.

## 2014-06-03 NOTE — Telephone Encounter (Signed)
Bobbie, [facility]  He approved these today from 06/01/14 msg, but to wrong pharmacy.

## 2014-06-03 NOTE — Telephone Encounter (Signed)
Patient was last seen on 04-03-14    Medication is pending  Please approve or deny this request

## 2014-06-03 NOTE — Telephone Encounter (Signed)
Last seen 04-03-14

## 2014-06-04 ENCOUNTER — Encounter: Attending: Family Medicine | Primary: Family Medicine

## 2014-06-04 MED ORDER — METFORMIN HCL ER 500 MG PO TB24
500 MG | ORAL_TABLET | ORAL | Status: DC
Start: 2014-06-04 — End: 2014-06-19

## 2014-06-04 MED ORDER — ATORVASTATIN CALCIUM 10 MG PO TABS
10 MG | ORAL_TABLET | ORAL | Status: DC
Start: 2014-06-04 — End: 2014-06-19

## 2014-06-04 MED ORDER — ESOMEPRAZOLE MAGNESIUM 40 MG PO CPDR
40 MG | ORAL_CAPSULE | ORAL | Status: DC
Start: 2014-06-04 — End: 2014-06-19

## 2014-06-04 MED ORDER — GLIMEPIRIDE 4 MG PO TABS
4 MG | ORAL_TABLET | ORAL | Status: DC
Start: 2014-06-04 — End: 2014-07-02

## 2014-06-04 MED ORDER — SITAGLIPTIN PHOSPHATE 100 MG PO TABS
100 MG | ORAL_TABLET | ORAL | Status: DC
Start: 2014-06-04 — End: 2014-12-25

## 2014-06-04 MED ORDER — ALENDRONATE SODIUM 70 MG PO TABS
70 MG | ORAL_TABLET | ORAL | Status: DC
Start: 2014-06-04 — End: 2014-06-19

## 2014-06-04 MED ORDER — AMLODIPINE BESYLATE 10 MG PO TABS
10 MG | ORAL_TABLET | ORAL | Status: DC
Start: 2014-06-04 — End: 2014-06-19

## 2014-06-04 MED ORDER — DAPAGLIFLOZIN PROPANEDIOL 10 MG PO TABS
10 MG | ORAL_TABLET | ORAL | Status: DC
Start: 2014-06-04 — End: 2014-10-29

## 2014-06-04 MED ORDER — VALSARTAN 80 MG PO TABS
80 MG | ORAL_TABLET | ORAL | Status: DC
Start: 2014-06-04 — End: 2014-07-02

## 2014-06-11 ENCOUNTER — Encounter: Payer: MEDICARE | Attending: Family Medicine | Primary: Family Medicine

## 2014-06-19 ENCOUNTER — Ambulatory Visit: Admit: 2014-06-19 | Discharge: 2014-06-19 | Payer: MEDICARE | Attending: Family Medicine | Primary: Family Medicine

## 2014-06-19 DIAGNOSIS — I10 Essential (primary) hypertension: Secondary | ICD-10-CM

## 2014-06-19 MED ORDER — ATORVASTATIN CALCIUM 10 MG PO TABS
10 MG | ORAL_TABLET | ORAL | Status: DC
Start: 2014-06-19 — End: 2014-10-29

## 2014-06-19 MED ORDER — METFORMIN HCL ER 500 MG PO TB24
500 MG | ORAL_TABLET | ORAL | Status: DC
Start: 2014-06-19 — End: 2014-06-27

## 2014-06-19 MED ORDER — DAPAGLIFLOZIN PROPANEDIOL 10 MG PO TABS
10 MG | ORAL_TABLET | Freq: Every morning | ORAL | Status: DC
Start: 2014-06-19 — End: 2014-09-27

## 2014-06-19 MED ORDER — AMLODIPINE BESYLATE 10 MG PO TABS
10 MG | ORAL_TABLET | ORAL | Status: DC
Start: 2014-06-19 — End: 2014-10-29

## 2014-06-19 NOTE — Patient Instructions (Signed)
Diabetes Management    Blood Sugar Log  Day Breakfast Lunch Dinner    Before 2 hrs After Before 2 hrs After Before 2 hrs After    Time Number Time  Number Time Number Time Number Time  Number Time Number   Sun               Mon               Tues               Wed               Thurs               Fri               Sat                 1. Keep your blood sugar level (A1c) below 7.0%  ??? If your blood sugar is ABOVE 7.0%, get an A1c test every 3 months.   ??? If your blood sugar is BELOW 7.0%, get an A1c test every 6 months.    2. Keep your blood pressure below 130/80  ??? Make sure your blood pressure is measured at every office visit    3. Obtain a foot exam once a year during your doctor???s visit; also, do foot exam on yourself every week or have someone do it for you     4. Keep your LDL (bad) cholesterol level below 100  ??? Make sure your lipids are measured once a year     5. Obtain a urine test (microalbumin) once a year    6. Obtain an eye exam once a year    7. Learn to stay healthy living with Diabetes  ??? Attend Diabetes Education course if ordered by your physician    8. Exercise and slowly progress to (brisk walking, bike riding, etc) 30 minutes 3 to 5 days a week.    9. Obtain annual flu shot    10. Obtain pneumonia shot if you have not done so  11. Refer to patient education handouts given to you today    Diabetes Management Community Resources   Organization Address Program Phone / Website   [facility] [address] [place] [address] Diabetes Self-Management  [phone]   [facility] [address] [place], [place] Diabetes Self-Management  [phone]   [facility] [place] [address] [place], [place]  Diabetes Self-Management  [phone]   [facility] [address] [place] [address] Help with Food [phone]   Partnership for Prescription Assistance n/a Medications [phone]

## 2014-06-19 NOTE — Telephone Encounter (Signed)
Last visit 04/03/14

## 2014-06-19 NOTE — Progress Notes (Signed)
Lori Chase 74 y.o. female presents today with   Chief Complaint   Patient presents with   ??? Diabetes     follow up   ??? Hypertension     follow up   ??? Hyperlipidemia     follow up     Treatment Adherence:   Medication compliance:  compliant all of the time  Diet compliance:  compliant most of the time  Weight trend: stable  Current exercise: no regular exercise    Diabetes Mellitus Type 2: Current symptoms/problems include none.    Home blood sugar records:  fasting range: 136-150  Any episodes of hypoglycemia? no  Eye exam current (within one year): yes  Tobacco history: She  reports that she has never smoked. She has never used smokeless tobacco.   Daily Aspirin? Yes  Known diabetic complications: none    Hypertension:  Home blood pressure monitoring: Yes - normal range.  She is adherent to a low sodium diet. Patient denies chest pain, shortness of breath, headache, lightheadedness, blurred vision, peripheral edema, palpitations, dry cough and fatigue.  Antihypertensive medication side effects: no medication side effects noted.  Use of agents associated with hypertension: none.     Hyperlipidemia:  No new myalgias or GI upset on atorvastatin (Lipitor).       Lab Results   Component Value Date    LABA1C 7.4* 04/03/2014    LABA1C 8.3* 12/17/2013    LABA1C 8.2* 09/05/2013     Lab Results   Component Value Date    LABMICR 2.80* 06/19/2014    CREATININE 0.64 04/03/2014     Lab Results   Component Value Date    ALT 19 04/03/2014    AST 20 04/03/2014     Lab Results   Component Value Date    CHOL 141 04/03/2014    TRIG 233* 04/03/2014    HDL 44 04/03/2014    LDLCALC 50 04/03/2014              Diabetes  She presents for her follow-up diabetic visit. She has type 2 diabetes mellitus. There are no hypoglycemic associated symptoms. Pertinent negatives for hypoglycemia include no confusion, dizziness, headaches, mood changes, nervousness/anxiousness, pallor, seizures or sweats. Pertinent negatives for diabetes include no  blurred vision, no chest pain, no fatigue, no foot paresthesias, no foot ulcerations, no polydipsia, no polyphagia, no polyuria, no visual change, no weakness and no weight loss. There are no hypoglycemic complications. Symptoms are stable. Risk factors for coronary artery disease include hypertension, post-menopausal and sedentary lifestyle. She is compliant with treatment all of the time. Her weight is stable. She is following a generally healthy diet.   Hypertension  This is a chronic problem. The current episode started more than 1 year ago. The problem is unchanged. Pertinent negatives include no anxiety, blurred vision, chest pain, headaches, malaise/fatigue, neck pain, orthopnea, palpitations, peripheral edema, PND, shortness of breath or sweats. There are no compliance problems.    Hyperlipidemia  This is a chronic problem. The current episode started more than 1 year ago. There are no known factors aggravating her hyperlipidemia. Pertinent negatives include no chest pain or shortness of breath. Current antihyperlipidemic treatment includes statins. There are no compliance problems.  Risk factors for coronary artery disease include hypertension, post-menopausal and diabetes mellitus.     Fu gerd    Past Medical History   Diagnosis Date   ??? Type II or unspecified type diabetes mellitus without mention of complication, not stated as uncontrolled (  Lori Chase)    ??? Anxiety    ??? Hypertension    ??? Cancer (Lori Chase)      breast   ??? 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 Right knee pain 01/07/2012   ??? Colon polyp      Patient Active Problem List    Diagnosis Date Noted   ??? Other and unspecified hyperlipidemia 04/03/2014   ??? Primary osteoarthritis of both knees 01/21/2014   ??? DM type 2 with diabetic dyslipidemia (Lori Chase) 10/10/2013   ??? Cataract 09/24/2013   ??? OA (osteoarthritis) of knee 07/09/2013   ??? Abnormal mammogram with microcalcification 02/02/2013   ??? Leg cramping 04/26/2012   ??? Transient arthropathy of knee 01/07/2012    ??? OA (osteoarthritis) 12/09/2011   ??? Symptomatic menopausal or female climacteric states 07/26/2011   ??? Breast cancer 03/01/2011   ??? GERD (gastroesophageal reflux disease) 11/04/2010   ??? Osteopenia 11/04/2010   ??? HIGH CHOLESTEROL 07/16/2010   ??? Type II or unspecified type diabetes mellitus without mention of complication, not stated as uncontrolled    ??? Hypertension    ??? History of malignant neoplasm of breast 12/29/2005     Past Surgical History   Procedure Laterality Date   ??? Cholecystectomy     ??? Tonsillectomy  age 59   ??? Breast lumpectomy  2007   ??? Colonoscopy  09/18/12     [name]     Family History   Problem Relation Age of Onset   ??? High Blood Pressure Mother    ??? High Blood Pressure Father      History     Lori History   ??? Marital Status: Married     Spouse Name: Lori Chase     Number of Children: Lori Chase   ??? Years of Education: Lori Chase     Lori History Main Topics   ??? Smoking status: Never Smoker    ??? Smokeless tobacco: Never Used   ??? Alcohol Use: Yes      Comment: occ wine   ??? Drug Use: No   ??? Sexual Activity: None     Other Topics Concern   ??? None     Lori History Narrative     Allergies   Allergen Reactions   ??? Morphine    ??? Oxycontin [Oxycodone]        Review of Systems   Constitutional: Negative for weight loss, malaise/fatigue and fatigue.   Eyes: Negative for blurred vision.   Respiratory: Negative for shortness of breath.    Cardiovascular: Negative for chest pain, palpitations, orthopnea and PND.   Endocrine: Negative for polydipsia, polyphagia and polyuria.   Musculoskeletal: Negative for neck pain.   Skin: Negative for pallor.   Neurological: Negative for dizziness, seizures, weakness and headaches.   Psychiatric/Behavioral: Negative for confusion. The patient is not nervous/anxious.            Filed Vitals:    06/19/14 1437   BP: 130/60   Pulse: 84   Temp: 97.2 ??F (36.2 ??C)   Resp: 14   Height: 5\' 7"  (1.702 m)   Weight: 150 lb (68.04 kg)       Physical Exam      PHYSICAL EXAMINATION:   VITAL SIGNS:  are as recorded.  GENERAL:  The patient appears well nourished and well-developed, and with normal affect.  No acute respiratory distress. Alert and oriented times 3. No skin rashes.     HEENT:  TMs normal bilaterally.  Canals and ears normal.  Pharynx is clear. Extraocular eye motions intact and pain free.   Pupils reactive and equally round.  Sclerae and conjunctivae clear, normocephalic and atraumatic.      RESPIRATORY:  Clear and equal breath sounds with no acute respiratory distress.       HEART: Regular rhythm without murmur, rub or gallop.     ABDOMEN:  soft, nontender. No masses, guarding or rebound. Normoactive bowel sounds.       Assessment/Plan  Latoyna was seen today for diabetes, hypertension and hyperlipidemia.    Diagnoses and associated orders for this visit:    Essential hypertension  - amLODIPine (NORVASC) 10 MG tablet; TAKE 1 TABLET BY MOUTH EVERY DAY.    Gastroesophageal reflux disease with esophagitis    DM type 2 with diabetic dyslipidemia ([facility])  - Microalbumin / Creatinine Urine Ratio    Other and unspecified hyperlipidemia    Other Orders  - atorvastatin (LIPITOR) 10 MG tablet; TAKE 1 TABLET BY MOUTH EVERY DAY.  - Discontinue: metFORMIN ER (GLUCOPHAGE-XR) 500 MG XR tablet; TAKE 1 TABLET BY MOUTH 4 TIMES A DAY  - dapagliflozin (FARXIGA) 10 MG tablet; Take 1 tablet by mouth every morning            Health Maintenance Due   Topic Date Due   ??? TETANUS VACCINE ADULT (11 YEARS AND UP)  05/24/2012   ??? FOOT EXAM  04/09/2014   ??? OPHTHALMOLOGY EXAM  04/09/2014   ??? DEPRESSION SCREENING  05/09/2014   Diabetes Counseling   Patient was again counseled regarding disease risks and need to adopt/ maintain a healthy lifestyle, proceed with self management in the home setting by keeping logs to record blood sugar as frequently as advised, blood pressure, caloric intake, weight and physical activity level. Patient was instructed to keep logs up-to-date and to always bring logs to all office  visits.        Controlled Substances Monitoring:      No Follow-up on file.    Ignacia Felling, MD

## 2014-06-20 LAB — MICROALBUMIN / CREATININE URINE RATIO
Creatinine, Ur: 139.1 mg/dL
Microalbumin Creatinine Ratio: 20.1 mg/G (ref 0.0–30.0)
Microalbumin, Random Urine: 2.8 mg/dL — ABNORMAL HIGH

## 2014-06-20 MED ORDER — ESOMEPRAZOLE MAGNESIUM 40 MG PO CPDR
40 MG | ORAL_CAPSULE | ORAL | Status: DC
Start: 2014-06-20 — End: 2014-11-19

## 2014-06-29 MED ORDER — METFORMIN HCL ER 500 MG PO TB24
500 MG | ORAL_TABLET | ORAL | Status: DC
Start: 2014-06-29 — End: 2014-12-24

## 2014-07-04 MED ORDER — VALSARTAN 80 MG PO TABS
80 MG | ORAL_TABLET | ORAL | Status: DC
Start: 2014-07-04 — End: 2015-01-02

## 2014-07-04 MED ORDER — GLIMEPIRIDE 4 MG PO TABS
4 MG | ORAL_TABLET | ORAL | Status: DC
Start: 2014-07-04 — End: 2014-12-01

## 2014-07-04 NOTE — Telephone Encounter (Signed)
Medication approved & sent through electronically.

## 2014-07-22 ENCOUNTER — Ambulatory Visit: Admit: 2014-07-22 | Discharge: 2014-07-22 | Payer: MEDICARE | Attending: Family Medicine | Primary: Family Medicine

## 2014-07-22 DIAGNOSIS — E785 Hyperlipidemia, unspecified: Secondary | ICD-10-CM

## 2014-07-22 NOTE — Progress Notes (Signed)
Lori Chase 74 y.o. female presents today with   Chief Complaint   Patient presents with   ??? Diabetes   ??? Hypertension   ??? Hyperlipidemia       Diabetes  She presents for her follow-up diabetic visit. She has type 2 diabetes mellitus. Her disease course has been stable. There are no hypoglycemic associated symptoms. Pertinent negatives for hypoglycemia include no confusion, dizziness, headaches, hunger, mood changes, nervousness/anxiousness, pallor, seizures, sleepiness, speech difficulty, sweats or tremors. There are no diabetic associated symptoms. Pertinent negatives for diabetes include no blurred vision, no chest pain, no fatigue, no foot paresthesias, no foot ulcerations and no polydipsia. There are no hypoglycemic complications. Pertinent negatives for hypoglycemia complications include no blackouts, no hospitalization, no nocturnal hypoglycemia, no required assistance and no required glucagon injection. Symptoms are stable. There are no diabetic complications. Pertinent negatives for diabetic complications include no autonomic neuropathy, CVA, heart disease, nephropathy, peripheral neuropathy, PVD or retinopathy. Current diabetic treatment includes diet. She is compliant with treatment all of the time. Her weight is stable. She is following a diabetic and generally healthy diet. When asked about meal planning, she reported none. She participates in exercise daily. There is no change in her home blood glucose trend. Her breakfast blood glucose is taken between 6-7 am. Her breakfast blood glucose range is generally 110-130 mg/dl. Her dinner blood glucose is taken after 8 pm. Her dinner blood glucose range is generally 130-140 mg/dl. Her highest blood glucose is 140-180 mg/dl. Her overall blood glucose range is 110-130 mg/dl. Eye exam is current.   Hypertension  This is a recurrent problem. The current episode started more than 1 year ago. The problem is unchanged. The problem is controlled. Pertinent  negatives include no blurred vision, chest pain, headaches or sweats. There are no associated agents to hypertension. There are no compliance problems.  There is no history of CVA, PVD or retinopathy.   Hyperlipidemia  This is a chronic problem. The current episode started more than 1 year ago. The problem is controlled. Pertinent negatives include no chest pain.     Fu gerd & oa    Past Medical History   Diagnosis Date   ??? Type II or unspecified type diabetes mellitus without mention of complication, not stated as uncontrolled ([place])    ??? Anxiety    ??? Hypertension    ??? Cancer ([place])      breast   ??? 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 Right knee pain 01/07/2012   ??? Colon polyp      Patient Active Problem List    Diagnosis Date Noted   ??? Other and unspecified hyperlipidemia 04/03/2014   ??? Primary osteoarthritis of both knees 01/21/2014   ??? DM type 2 with diabetic dyslipidemia ([place]) 10/10/2013   ??? OA (osteoarthritis) of knee 07/09/2013   ??? Abnormal mammogram with microcalcification 02/02/2013   ??? Leg cramping 04/26/2012   ??? 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 OA (osteoarthritis) 12/09/2011   ??? Symptomatic menopausal or female climacteric states 07/26/2011   ??? Breast cancer 03/01/2011   ??? GERD (gastroesophageal reflux disease) 11/04/2010   ??? Osteopenia 11/04/2010   ??? HIGH CHOLESTEROL 07/16/2010   ??? Type II or unspecified type diabetes mellitus without mention of complication, not stated as uncontrolled    ??? Hypertension    ??? History of malignant neoplasm of breast 12/29/2005     Past Surgical History   Procedure Laterality Date   ??? Cholecystectomy     ???  Tonsillectomy  age 35   ??? Breast lumpectomy  2007   ??? Colonoscopy  09/18/12     [name]     Family History   Problem Relation Age of Onset   ??? High Blood Pressure Mother    ??? High Blood Pressure Father      History     Social History   ??? Marital Status: Married     Spouse Name: N/A     Number of Children: N/A   ??? Years of Education: N/A     Social History  Main Topics   ??? Smoking status: Never Smoker    ??? Smokeless tobacco: Never Used   ??? Alcohol Use: Yes      Comment: occ wine   ??? Drug Use: No   ??? Sexual Activity: None     Other Topics Concern   ??? None     Social History Narrative     Allergies   Allergen Reactions   ??? Morphine    ??? Oxycontin [Oxycodone]        Review of Systems   Constitutional: Negative for fatigue.   HENT: Negative.    Eyes: Negative.  Negative for blurred vision.   Respiratory: Negative.    Cardiovascular: Negative for chest pain.   Gastrointestinal: Negative.    Endocrine: Negative for polydipsia.   Genitourinary: Negative.    Musculoskeletal: Negative.    Skin: Negative.  Negative for pallor.   Allergic/Immunologic: Negative.    Neurological: Negative.  Negative for dizziness, tremors, seizures, speech difficulty and headaches.   Hematological: Negative.    Psychiatric/Behavioral: Negative.  Negative for confusion. The patient is not nervous/anxious.            Filed Vitals:    07/22/14 1348   BP: 132/74   Pulse: 76   Resp: 20   Height: 5' 6.5" (1.689 m)   Weight: 149 lb (67.586 kg)       Physical Exam      PHYSICAL EXAMINATION:   VITAL SIGNS: are as recorded.  GENERAL:  The patient appears well nourished and well-developed, and with normal affect.  No acute respiratory distress. Alert and oriented times 3. No skin rashes.     HEENT:  TMs normal bilaterally.  Canals and ears normal. Pharynx is clear. Extraocular eye motions intact and pain free.   Pupils reactive and equally round.  Sclerae and conjunctivae clear, normocephalic and atraumatic.      RESPIRATORY:  Clear and equal breath sounds with no acute respiratory distress.       HEART: Regular rhythm without murmur, rub or gallop.     ABDOMEN:  soft, nontender. No masses, guarding or rebound. Normoactive bowel sounds.     NECK: No masses or adenopathy palpable.  No bruits heard. No asymmetry visible.     oa b knees    Assessment/Plan  Lori Chase was seen today for diabetes, hypertension and  hyperlipidemia.    Diagnoses and associated orders for this visit:    DM type 2 with diabetic dyslipidemia ([place])  - Comprehensive Metabolic Panel  - Lipid Panel  - Hemoglobin A1C    Primary osteoarthritis of both knees    Other and unspecified hyperlipidemia  - Comprehensive Metabolic Panel  - Lipid Panel  - Hemoglobin A1C    Gastroesophageal reflux disease with esophagitis    Essential hypertension    Other malaise and fatigue  - CBC Auto Differential    Need for tetanus booster  - Td  vaccine preservative free greater than or equal to 7yo IM [facility])        dm,lipids,oa,gerd,htn stable    Health Maintenance Due   Topic Date Due   ??? FOOT EXAM  04/09/2014   ??? OPHTHALMOLOGY EXAM  04/09/2014   ??? DEPRESSION SCREENING  05/09/2014       Controlled Substances Monitoring:      Return in about 4 weeks (around 08/19/2014).    Ignacia Felling, MD

## 2014-07-23 LAB — COMPREHENSIVE METABOLIC PANEL
ALT: 25 U/L (ref 0–33)
AST: 22 U/L (ref 0–35)
Albumin: 4.2 g/dL (ref 3.9–4.9)
Alkaline Phosphatase: 50 U/L (ref 40–130)
Anion Gap: 16 mEq/L — ABNORMAL HIGH (ref 7–13)
BUN: 15 mg/dL (ref 8–23)
CO2: 22 mEq/L (ref 22–29)
Calcium: 9.6 mg/dL (ref 8.6–10.2)
Chloride: 96 mEq/L — ABNORMAL LOW (ref 98–107)
Creatinine: 0.59 mg/dL (ref 0.50–0.90)
GFR African American: >60 (ref >60)
GFR Non-African American: >60 (ref >60)
Globulin: 3.1 g/dL (ref 2.3–3.5)
Glucose: 208 mg/dL — ABNORMAL HIGH (ref 74–109)
Potassium: 4.4 mEq/L (ref 3.5–5.1)
Sodium: 134 mEq/L (ref 132–144)
Total Bilirubin: 0.2 mg/dL (ref 0.0–1.2)
Total Protein: 7.3 g/dL (ref 6.4–8.1)

## 2014-07-23 LAB — CBC WITH DIFFERENTIAL
Basophils %: 0.5 %
Basophils Absolute: 0.1 10*3/uL (ref 0.0–0.2)
Eosinophils %: 2.2 %
Eosinophils Absolute: 0.2 10*3/uL (ref 0.0–0.7)
Hematocrit: 34.8 % — ABNORMAL LOW (ref 37.0–47.0)
Hemoglobin: 10.9 g/dL — ABNORMAL LOW (ref 12.0–16.0)
Lymphocytes %: 21.8 %
Lymphocytes Absolute: 2.4 10*3/uL (ref 1.0–4.8)
MCH: 22.6 pg — ABNORMAL LOW (ref 27.0–31.3)
MCHC: 31.2 % — ABNORMAL LOW (ref 33.0–37.0)
MCV: 72.3 fL — ABNORMAL LOW (ref 82.0–100.0)
Monocytes %: 7.7 %
Monocytes Absolute: 0.8 10*3/uL (ref 0.2–0.8)
Neutrophils %: 67.8 %
Neutrophils Absolute: 7.4 10*3/uL — ABNORMAL HIGH (ref 1.4–6.5)
PLATELET SLIDE REVIEW: NORMAL
Platelets: 267 10*3/uL (ref 130–400)
RBC: 4.81 M/uL (ref 4.20–5.40)
RDW: 20.3 % — ABNORMAL HIGH (ref 11.5–14.5)
WBC: 10.9 10*3/uL — ABNORMAL HIGH (ref 4.8–10.8)

## 2014-07-23 LAB — LIPID PANEL
Cholesterol, Total: 140 mg/dL (ref 0–199)
HDL: 45 mg/dL (ref 40–59)
LDL Calculated: 40 mg/dL (ref 0–129)
Triglycerides: 275 mg/dL — ABNORMAL HIGH (ref 0–200)

## 2014-07-23 LAB — HEMOGLOBIN A1C: Hemoglobin A1C: 7.7 % — ABNORMAL HIGH (ref 4.8–5.9)

## 2014-08-19 ENCOUNTER — Encounter: Attending: Family Medicine | Primary: Family Medicine

## 2014-08-28 ENCOUNTER — Ambulatory Visit: Admit: 2014-08-28 | Discharge: 2014-08-28 | Payer: MEDICARE | Attending: Family Medicine | Primary: Family Medicine

## 2014-08-28 DIAGNOSIS — E785 Hyperlipidemia, unspecified: Secondary | ICD-10-CM

## 2014-08-28 NOTE — Progress Notes (Signed)
Lori Chase 74 y.o. female presents today with   Chief Complaint   Patient presents with   ??? Diabetes     folow up for Rx refill   ??? Hypertension     follow up     Treatment Adherence:   Medication compliance:  compliant all of the time  Diet compliance:  compliant most of the time  Weight trend: stable  Current exercise: walks 2 time(s) per week      Diabetes Mellitus Type 2: Current symptoms/problems include none.    Home blood sugar records:  fasting range: 110  Any episodes of hypoglycemia? no  Eye exam current (within one year): yes  Tobacco history: She  reports that she has never smoked. She has never used smokeless tobacco.   Daily Aspirin? Yes  Known diabetic complications: none    Hypertension:  Home blood pressure monitoring: Yes - 120/60.  She is adherent to a low sodium diet. Patient denies chest pain, shortness of breath, headache, lightheadedness, blurred vision, peripheral edema, palpitations, dry cough and fatigue.  Antihypertensive medication side effects: no medication side effects noted.  Use of agents associated with hypertension: none.     Hyperlipidemia:  No new myalgias or GI upset on atorvastatin (Lipitor).       Lab Results   Component Value Date    LABA1C 7.7* 07/22/2014    LABA1C 7.4* 04/03/2014    LABA1C 8.3* 12/17/2013     Lab Results   Component Value Date    LABMICR 2.80* 06/19/2014    CREATININE 0.59 07/22/2014     Lab Results   Component Value Date    ALT 25 07/22/2014    AST 22 07/22/2014     Lab Results   Component Value Date    CHOL 140 07/22/2014    TRIG 275* 07/22/2014    HDL 45 07/22/2014    LDLCALC 40 07/22/2014              Diabetes  She presents for her follow-up diabetic visit. She has type 2 diabetes mellitus. Her disease course has been stable. There are no hypoglycemic associated symptoms. Pertinent negatives for hypoglycemia include no headaches or sweats. There are no diabetic associated symptoms. Pertinent negatives for diabetes include no blurred vision and no  chest pain. There are no hypoglycemic complications. Symptoms are stable. There are no diabetic complications. Risk factors for coronary artery disease include diabetes mellitus, hypertension and post-menopausal. Current diabetic treatments: 4 oral agent. She is compliant with treatment all of the time. Her weight is stable. She is following a generally healthy diet. She participates in exercise intermittently. Eye exam is current.   Hypertension  This is a chronic problem. The current episode started more than 1 year ago. The problem is unchanged. Pertinent negatives include no anxiety, blurred vision, chest pain, headaches, malaise/fatigue, neck pain, orthopnea, palpitations, peripheral edema, PND, shortness of breath or sweats. There are no associated agents to hypertension. Risk factors for coronary artery disease include diabetes mellitus and post-menopausal state. There are no compliance problems.      Fu oa,lipids    Past Medical History   Diagnosis Date   ??? Type II or unspecified type diabetes mellitus without mention of complication, not stated as uncontrolled ([place])    ??? Anxiety    ??? Hypertension    ??? Cancer ([place])      breast   ??? 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 Right knee pain 01/07/2012   ???  Colon polyp      Patient Active Problem List    Diagnosis Date Noted   ??? Other and unspecified hyperlipidemia 04/03/2014   ??? Primary osteoarthritis of both knees 01/21/2014   ??? DM type 2 with diabetic dyslipidemia ([place]) 10/10/2013   ??? OA (osteoarthritis) of knee 07/09/2013   ??? Abnormal mammogram with microcalcification 02/02/2013   ??? 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 OA (osteoarthritis) 12/09/2011   ??? Symptomatic menopausal or female climacteric states 07/26/2011   ??? Breast cancer 03/01/2011   ??? GERD (gastroesophageal reflux disease) 11/04/2010   ??? Osteopenia 11/04/2010   ??? HIGH CHOLESTEROL 07/16/2010   ??? Type II or unspecified type diabetes mellitus without mention of complication, not stated as  uncontrolled    ??? Hypertension    ??? History of malignant neoplasm of breast 12/29/2005     Past Surgical History   Procedure Laterality Date   ??? Cholecystectomy     ??? Tonsillectomy  age 6   ??? Breast lumpectomy  2007   ??? Colonoscopy  09/18/12     [name]     Family History   Problem Relation Age of Onset   ??? High Blood Pressure Mother    ??? High Blood Pressure Father      History     Social History   ??? Marital Status: Married     Spouse Name: N/A     Number of Children: N/A   ??? Years of Education: N/A     Social History Main Topics   ??? Smoking status: Never Smoker    ??? Smokeless tobacco: Never Used   ??? Alcohol Use: Yes      Comment: occ wine   ??? Drug Use: No   ??? Sexual Activity: None     Other Topics Concern   ??? None     Social History Narrative     Allergies   Allergen Reactions   ??? Morphine    ??? Oxycontin [Oxycodone]        Review of Systems   Constitutional: Negative for chills, malaise/fatigue and appetite change.   Eyes: Negative for blurred vision.   Respiratory: Negative for apnea, chest tightness, shortness of breath and stridor.    Cardiovascular: Negative for chest pain, palpitations, orthopnea and PND.   Gastrointestinal: Negative for abdominal pain and abdominal distention.   Genitourinary: Negative for difficulty urinating.   Musculoskeletal: Negative for arthralgias and neck pain.   Neurological: Negative for headaches.           Filed Vitals:    08/28/14 1448   BP: 120/60   Pulse: 84   Temp: 97.3 ??F (36.3 ??C)   Resp: 12   Height: 5' 6.5" (1.689 m)   Weight: 154 lb (69.854 kg)       Physical Exam      PHYSICAL EXAMINATION:   VITAL SIGNS: are as recorded.  GENERAL:  The patient appears well nourished and well-developed, and with normal affect.  No acute respiratory distress. Alert and oriented times 3. No skin rashes.     HEENT:  TMs normal bilaterally.  Canals and ears normal. Pharynx is clear. Extraocular eye motions intact and pain free.   Pupils reactive and equally round.  Sclerae and conjunctivae  clear, normocephalic and atraumatic.      RESPIRATORY:  Clear and equal breath sounds with no acute respiratory distress.       HEART: Regular rhythm without murmur, rub or gallop.  ABDOMEN:  soft, nontender. No masses, guarding or rebound. Normoactive bowel sounds.     NECK: No masses or adenopathy palpable.  No bruits heard. No asymmetry visible.         Assessment/Plan  Lori Chase was seen today for diabetes and hypertension.    Diagnoses and associated orders for this visit:    DM type 2 with diabetic dyslipidemia ([place])    Other and unspecified hyperlipidemia    Primary osteoarthritis of both knees    Primary osteoarthritis involving multiple joints              Health Maintenance Due   Topic Date Due   ??? FOOT EXAM  04/09/2014   ??? OPHTHALMOLOGY EXAM  04/09/2014   Diabetes Counseling   Patient was again counseled regarding disease risks and need to adopt/ maintain a healthy lifestyle, check blood sugar        daily at home and keep log, watch blood pressure, caloric intake, weight and physical activity. Patient was instructed to keep logs up-to-date and to always bring logs to all office visits.    See bw      Controlled Substances Monitoring:      Return in about 4 weeks (around 09/25/2014).    Ignacia Felling, MD

## 2014-08-28 NOTE — Patient Instructions (Signed)
Diabetes Management    Blood Sugar Log  Day Breakfast Lunch Dinner    Before 2 hrs After Before 2 hrs After Before 2 hrs After    Time Number Time  Number Time Number Time Number Time  Number Time Number   Sun               Mon               Tues               Wed               Thurs               Fri               Sat                 1. Keep your blood sugar level (A1c) below 7.0%  ??? If your blood sugar is ABOVE 7.0%, get an A1c test every 3 months.   ??? If your blood sugar is BELOW 7.0%, get an A1c test every 6 months.    2. Keep your blood pressure below 130/80  ??? Make sure your blood pressure is measured at every office visit    3. Obtain a foot exam once a year during your doctor???s visit; also, do foot exam on yourself every week or have someone do it for you     4. Keep your LDL (bad) cholesterol level below 100  ??? Make sure your lipids are measured once a year     5. Obtain a urine test (microalbumin) once a year    6. Obtain an eye exam once a year    7. Learn to stay healthy living with Diabetes  ??? Attend Diabetes Education course if ordered by your physician    8. Exercise and slowly progress to (brisk walking, bike riding, etc) 30 minutes 3 to 5 days a week.    9. Obtain annual flu shot    10. Obtain pneumonia shot if you have not done so  11. Refer to patient education handouts given to you today    Diabetes Management Community Resources   Organization Address Program Phone / Website   [facility] [address] [place] [address] Diabetes Self-Management  [phone]   [facility] [address] [place], [place] Diabetes Self-Management  [phone]   [facility] [place] [address] [place], [place]  Diabetes Self-Management  [phone]   [facility] [address] [place] [address] Help with Food [phone]   Partnership for Prescription Assistance n/a Medications [phone]

## 2014-09-05 NOTE — Telephone Encounter (Signed)
Lori Chase needs PA. Submitted. Approved

## 2014-09-27 ENCOUNTER — Ambulatory Visit: Admit: 2014-09-27 | Discharge: 2014-09-27 | Payer: MEDICARE | Attending: Family Medicine | Primary: Family Medicine

## 2014-09-27 DIAGNOSIS — E785 Hyperlipidemia, unspecified: Secondary | ICD-10-CM

## 2014-09-27 MED ORDER — EMPAGLIFLOZIN 10 MG PO TABS
10 MG | ORAL_TABLET | Freq: Every day | ORAL | Status: DC
Start: 2014-09-27 — End: 2014-12-23

## 2014-09-27 NOTE — Patient Instructions (Signed)
Diabetes Management    Blood Sugar Log  Day Breakfast Lunch Dinner    Before 2 hrs After Before 2 hrs After Before 2 hrs After    Time Number Time  Number Time Number Time Number Time  Number Time Number   Sun               Mon               Tues               Wed               Thurs               Fri               Sat                 1. Keep your blood sugar level (A1c) below 7.0%  ??? If your blood sugar is ABOVE 7.0%, get an A1c test every 3 months.   ??? If your blood sugar is BELOW 7.0%, get an A1c test every 6 months.    2. Keep your blood pressure below 130/80  ??? Make sure your blood pressure is measured at every office visit    3. Obtain a foot exam once a year during your doctor???s visit; also, do foot exam on yourself every week or have someone do it for you     4. Keep your LDL (bad) cholesterol level below 100  ??? Make sure your lipids are measured once a year     5. Obtain a urine test (microalbumin) once a year    6. Obtain an eye exam once a year    7. Learn to stay healthy living with Diabetes  ??? Attend Diabetes Education course if ordered by your physician    8. Exercise and slowly progress to (brisk walking, bike riding, etc) 30 minutes 3 to 5 days a week.    9. Obtain annual flu shot    10. Obtain pneumonia shot if you have not done so  11. Refer to patient education handouts given to you today    Diabetes Management Community Resources   Organization Address Program Phone / Website   [facility] [address] [place] [address] Diabetes Self-Management  [phone]   [facility] [address] [place], [place] Diabetes Self-Management  [phone]   [facility] [place] [address] [place], [place]  Diabetes Self-Management  [phone]   [facility] [address] [place] [address] Help with Food [phone]   Partnership for Prescription Assistance n/a Medications [phone]

## 2014-09-27 NOTE — Progress Notes (Signed)
Lori Chase 74 y.o. female presents today with   Chief Complaint   Patient presents with   ??? Diabetes     follow up   ??? Hypertension     follow up     Treatment Adherence:   Medication compliance:  compliant all of the time  Diet compliance:  compliant most of the time  Weight trend: stable  Current exercise: no regular exercise      Diabetes Mellitus Type 2: Current symptoms/problems include none.    Home blood sugar records:  fasting range: 110-118  Any episodes of hypoglycemia? no  Eye exam current (within one year): no  Tobacco history: She  reports that she has never smoked. She has never used smokeless tobacco.   Daily Aspirin? Yes  Known diabetic complications: none    Hypertension:  Home blood pressure monitoring: No.  She is adherent to a low sodium diet. Patient denies chest pain, shortness of breath, headache, lightheadedness, blurred vision, peripheral edema, palpitations, dry cough and fatigue.  Antihypertensive medication side effects: no medication side effects noted.  Use of agents associated with hypertension: none.     Hyperlipidemia:  No new myalgias or GI upset on atorvastatin (Lipitor).       Lab Results   Component Value Date    LABA1C 7.7* 07/22/2014    LABA1C 7.4* 04/03/2014    LABA1C 8.3* 12/17/2013     Lab Results   Component Value Date    LABMICR 2.80* 06/19/2014    CREATININE 0.59 07/22/2014     Lab Results   Component Value Date    ALT 25 07/22/2014    AST 22 07/22/2014     Lab Results   Component Value Date    CHOL 140 07/22/2014    TRIG 275* 07/22/2014    HDL 45 07/22/2014    LDLCALC 40 07/22/2014              Diabetes  She presents for her follow-up diabetic visit. She has type 2 diabetes mellitus. There are no hypoglycemic associated symptoms. Pertinent negatives for hypoglycemia include no headaches or sweats. There are no diabetic associated symptoms. Pertinent negatives for diabetes include no blurred vision and no chest pain. There are no hypoglycemic complications. There are  no diabetic complications. Risk factors for coronary artery disease include diabetes mellitus, hypertension and post-menopausal. Current diabetic treatment includes oral agent (triple therapy). She is compliant with treatment all of the time. Her weight is stable. She is following a generally healthy diet. She rarely participates in exercise. Eye exam is not current.   Hypertension  This is a chronic problem. The current episode started more than 1 year ago. The problem is unchanged. Pertinent negatives include no anxiety, blurred vision, chest pain, headaches, malaise/fatigue, neck pain, orthopnea, palpitations, peripheral edema, PND, shortness of breath or sweats. There are no associated agents to hypertension. Risk factors for coronary artery disease include diabetes mellitus and post-menopausal state. Past treatments include calcium channel blockers. Compliance problems include exercise.        Past Medical History   Diagnosis Date   ??? Type II or unspecified type diabetes mellitus without mention of complication, not stated as uncontrolled ([place])    ??? Anxiety    ??? Hypertension    ??? Cancer ([place])      breast   ??? 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 Right knee pain 01/07/2012   ??? Colon polyp      Patient Active Problem List  Diagnosis Date Noted   ??? Other and unspecified hyperlipidemia 04/03/2014   ??? Primary osteoarthritis of both knees 01/21/2014   ??? 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 OA (osteoarthritis) of knee 07/09/2013   ??? Abnormal mammogram with microcalcification 02/02/2013   ??? 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 OA (osteoarthritis) 12/09/2011   ??? Symptomatic menopausal or female climacteric states 07/26/2011   ??? Breast cancer 03/01/2011   ??? GERD (gastroesophageal reflux disease) 11/04/2010   ??? Osteopenia 11/04/2010   ??? HIGH CHOLESTEROL 07/16/2010   ??? Type II or unspecified type diabetes mellitus without mention of complication, not stated as uncontrolled    ???  Hypertension    ??? History of malignant neoplasm of breast 12/29/2005     Past Surgical History   Procedure Laterality Date   ??? Cholecystectomy     ??? Tonsillectomy  age 79   ??? Breast lumpectomy  2007   ??? Colonoscopy  09/18/12     [name]     Family History   Problem Relation Age of Onset   ??? High Blood Pressure Mother    ??? High Blood Pressure Father      History     Social History   ??? Marital Status: Married     Spouse Name: N/A     Number of Children: N/A   ??? Years of Education: N/A     Social History Main Topics   ??? Smoking status: Never Smoker    ??? Smokeless tobacco: Never Used   ??? Alcohol Use: Yes      Comment: occ wine   ??? Drug Use: No   ??? Sexual Activity: None     Other Topics Concern   ??? None     Social History Narrative     Allergies   Allergen Reactions   ??? Morphine    ??? Oxycontin [Oxycodone]        Review of Systems   Constitutional: Negative for malaise/fatigue.   Eyes: Negative for blurred vision.   Respiratory: Negative for shortness of breath.    Cardiovascular: Negative for chest pain, palpitations, orthopnea and PND.   Musculoskeletal: Negative for neck pain.   Neurological: Negative for headaches.           Filed Vitals:    09/27/14 1322   BP: 126/60   Pulse: 72   Temp: 96.8 ??F (36 ??C)   Resp: 16   Height: 5' 6.5" (1.689 m)   Weight: 150 lb (68.04 kg)       Physical Exam      PHYSICAL EXAMINATION:   VITAL SIGNS: are as recorded.  GENERAL:  The patient appears well nourished and well-developed, and with normal affect.  No acute respiratory distress. Alert and oriented times 3. No skin rashes.     HEENT:  TMs normal bilaterally.  Canals and ears normal. Pharynx is clear. Extraocular eye motions intact and pain free.   Pupils reactive and equally round.  Sclerae and conjunctivae clear, normocephalic and atraumatic.      RESPIRATORY:  Clear and equal breath sounds with no acute respiratory distress.       HEART: Regular rhythm without murmur, rub or gallop.     ABDOMEN:  soft, nontender. No masses,  guarding or rebound. Normoactive bowel sounds.     NECK: No masses or adenopathy palpable.  No bruits heard. No asymmetry visible.     Assessment/Plan  Azari was seen today for diabetes  and hypertension.    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 FOOT EXAM  04/09/2014   ??? OPHTHALMOLOGY EXAM  04/09/2014   Diabetes Counseling   Patient was again counseled regarding disease risks and need to adopt/ maintain a healthy lifestyle, check blood sugar        daily at home and keep log, watch blood pressure, caloric intake, weight and physical activity. Patient was instructed to keep logs up-to-date and to always bring logs to all office visits.        Controlled Substances Monitoring:      Return in about 4 weeks (around 10/25/2014).    Ignacia Felling, MD

## 2014-10-23 ENCOUNTER — Encounter

## 2014-10-23 NOTE — Telephone Encounter (Signed)
Patient was last seen on 09/27/14.    Medication request is pending.  Please approve or deny this request.

## 2014-10-24 MED ORDER — ALENDRONATE SODIUM 70 MG PO TABS
70 MG | ORAL_TABLET | ORAL | Status: DC
Start: 2014-10-24 — End: 2015-01-02

## 2014-10-28 NOTE — Telephone Encounter (Signed)
Medication approved & sent through electronically.

## 2014-10-29 ENCOUNTER — Ambulatory Visit: Admit: 2014-10-29 | Discharge: 2014-10-29 | Payer: MEDICARE | Attending: Family Medicine | Primary: Family Medicine

## 2014-10-29 ENCOUNTER — Encounter

## 2014-10-29 DIAGNOSIS — H6505 Acute serous otitis media, recurrent, left ear: Secondary | ICD-10-CM

## 2014-10-29 MED ORDER — CANAGLIFLOZIN 300 MG PO TABS
300 MG | ORAL_TABLET | Freq: Every day | ORAL | Status: DC
Start: 2014-10-29 — End: 2014-12-19

## 2014-10-29 NOTE — Telephone Encounter (Signed)
Patient was last seen on 10/29/14.    Medication request is pending.  Please approve or deny this request.

## 2014-10-29 NOTE — Progress Notes (Signed)
Lori Chase 74 y.o. female presents today with   Chief Complaint   Patient presents with   ??? Follow-Up from Hospital     Patient is here for a follow up from [facility] ER for bleeding in the left ear.   ??? Diabetes       HPI Comments: Patient is here for a follow up from [facility] ER for bleeding in the left ear.    Diabetes  She presents for her follow-up diabetic visit. She has type 2 diabetes mellitus. There are no hypoglycemic associated symptoms. Pertinent negatives for diabetes include no blurred vision, no chest pain, no fatigue, no visual change and no weakness. There are no hypoglycemic complications. Risk factors for coronary artery disease include sedentary lifestyle, hypertension, diabetes mellitus and dyslipidemia. Current diabetic treatments: Jardiance,Metformin, Amaryl, And Januvia. She is compliant with treatment all of the time. She has not had a previous visit with a dietitian. She never participates in exercise. She does not see a podiatrist.Eye exam is current.     Fu htn,lipids,    Past Medical History   Diagnosis Date   ??? Anxiety    ??? Cancer (HCC)      breast   ??? Colon polyp    ??? Hypertension    ??? Right knee pain 01/07/2012   ??? 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 Type II or unspecified type diabetes mellitus without mention of complication, not stated as uncontrolled      Patient Active Problem List    Diagnosis Date Noted   ??? Transient cerebral ischemia 10/30/2014   ??? Primary osteoarthritis of left knee 10/29/2014   ??? Other and unspecified hyperlipidemia 04/03/2014   ??? Primary osteoarthritis of both knees 01/21/2014   ??? 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 OA (osteoarthritis) of knee 07/09/2013   ??? Abnormal mammogram with microcalcification 02/02/2013   ??? 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 OA (osteoarthritis) 12/09/2011   ??? Symptomatic menopausal or female climacteric states 07/26/2011   ??? Breast cancer ([place]) 03/01/2011   ??? GERD (gastroesophageal reflux  disease) 11/04/2010   ??? Osteopenia 11/04/2010   ??? HIGH CHOLESTEROL 07/16/2010   ??? Type II or unspecified type diabetes mellitus without mention of complication, not stated as uncontrolled    ??? Hypertension    ??? History of malignant neoplasm of breast 12/29/2005     Past Surgical History   Procedure Laterality Date   ??? Cholecystectomy     ??? Tonsillectomy  age 46   ??? Breast lumpectomy  2007   ??? Colonoscopy  09/18/12     [name]     Family History   Problem Relation Age of Onset   ??? High Blood Pressure Mother    ??? High Blood Pressure Father      Social History     Social History   ??? Marital status: Married     Spouse name: N/A   ??? Number of children: N/A   ??? Years of education: N/A     Social History Main Topics   ??? Smoking status: Never Smoker   ??? Smokeless tobacco: Never Used   ??? Alcohol use Yes      Comment: occ wine   ??? Drug use: No   ??? Sexual activity: Not Asked     Other Topics Concern   ??? None     Social History Narrative     Allergies   Allergen Reactions   ???  Morphine    ??? Oxycontin [Oxycodone]    ??? Penicillins Other (See Comments)       Review of Systems   Constitutional: Negative for fatigue.   HENT: Negative for congestion and dental problem.    Eyes: Negative for blurred vision and discharge.   Respiratory: Negative for apnea, choking and chest tightness.    Cardiovascular: Negative for chest pain and leg swelling.   Gastrointestinal: Negative for abdominal distention and abdominal pain.   Neurological: Negative for weakness.           Vitals:    10/29/14 1209   BP: 136/80   Pulse: 80   Resp: 12   Temp: 98.2 ??F (36.8 ??C)   Weight: 150 lb (68 kg)   Height: 5' 6.5" (1.689 m)       Physical Exam      PHYSICAL EXAMINATION:   VITAL SIGNS: are as recorded.  GENERAL:  The patient appears well nourished and well-developed, and with normal affect.  No acute respiratory distress. Alert and oriented times 3. No skin rashes.     HEENT:  TMs normal bilaterally.  Canals and ears normal. Pharynx is clear. Extraocular eye  motions intact and pain free.   Pupils reactive and equally round.  Sclerae and conjunctivae clear, normocephalic and atraumatic.      RESPIRATORY:  Clear and equal breath sounds with no acute respiratory distress.       HEART: Regular rhythm without murmur, rub or gallop.     ABDOMEN:  soft, nontender. No masses, guarding or rebound. Normoactive bowel sounds.     oa b knees    Assessment/Plan  Lori Chase was seen today for follow-up from hospital and diabetes.    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  Consent was obtained.  Joint was injected with Kenalog & Lidocaine.  Pt tolerated procedure well.  There were no complications.L knee    See bw    Health Maintenance Due   Topic Date Due   ??? FOOT EXAM  04/09/2014   ??? OPHTHALMOLOGY EXAM  04/09/2014       Controlled Substances Monitoring:      Return in about 2 weeks (around 11/12/2014).    Lori Felling, MD

## 2014-10-30 ENCOUNTER — Ambulatory Visit: Admit: 2014-10-30 | Discharge: 2014-10-30 | Payer: MEDICARE | Attending: Family Medicine | Primary: Family Medicine

## 2014-10-30 DIAGNOSIS — R4781 Slurred speech: Secondary | ICD-10-CM

## 2014-10-30 LAB — COMPREHENSIVE METABOLIC PANEL
ALT: 25 U/L (ref 0–33)
AST: 20 U/L (ref 0–35)
Albumin: 4.3 g/dL (ref 3.9–4.9)
Alkaline Phosphatase: 65 U/L (ref 40–130)
Anion Gap: 15 mEq/L — ABNORMAL HIGH (ref 7–13)
BUN: 9 mg/dL (ref 8–23)
CO2: 23 mEq/L (ref 22–29)
Calcium: 9.8 mg/dL (ref 8.6–10.2)
Chloride: 96 mEq/L — ABNORMAL LOW (ref 98–107)
Creatinine: 0.65 mg/dL (ref 0.50–0.90)
GFR African American: >60 (ref >60)
GFR Non-African American: >60 (ref >60)
Globulin: 3.1 g/dL (ref 2.3–3.5)
Glucose: 220 mg/dL — ABNORMAL HIGH (ref 74–109)
Potassium: 4.6 mEq/L (ref 3.5–5.1)
Sodium: 134 mEq/L (ref 132–144)
Total Bilirubin: 0.1 mg/dL (ref 0.0–1.2)
Total Protein: 7.4 g/dL (ref 6.4–8.1)

## 2014-10-30 LAB — CBC WITH DIFFERENTIAL
Basophils %: 0.7 %
Basophils Absolute: 0.1 10*3/uL (ref 0.0–0.2)
Eosinophils %: 3.2 %
Eosinophils Absolute: 0.3 10*3/uL (ref 0.0–0.7)
Hematocrit: 35.8 % — ABNORMAL LOW (ref 37.0–47.0)
Hemoglobin: 11.1 g/dL — ABNORMAL LOW (ref 12.0–16.0)
Lymphocytes %: 24.9 %
Lymphocytes Absolute: 2.2 10*3/uL (ref 1.0–4.8)
MCH: 22.7 pg — ABNORMAL LOW (ref 27.0–31.3)
MCHC: 31.1 % — ABNORMAL LOW (ref 33.0–37.0)
MCV: 73 fL — ABNORMAL LOW (ref 82.0–100.0)
Monocytes %: 9.3 %
Monocytes Absolute: 0.8 10*3/uL (ref 0.2–0.8)
Neutrophils %: 61.9 %
Neutrophils Absolute: 5.5 10*3/uL (ref 1.4–6.5)
Platelets: 264 10*3/uL (ref 130–400)
RBC: 4.9 M/uL (ref 4.20–5.40)
RDW: 21.3 % — ABNORMAL HIGH (ref 11.5–14.5)
WBC: 8.9 10*3/uL (ref 4.8–10.8)

## 2014-10-30 LAB — LIPID PANEL
Cholesterol, Total: 190 mg/dL (ref 0–199)
HDL: 47 mg/dL (ref 40–59)
LDL Calculated: 86 mg/dL (ref 0–129)
Triglycerides: 285 mg/dL — ABNORMAL HIGH (ref 0–200)

## 2014-10-30 LAB — HEMOGLOBIN A1C: Hemoglobin A1C: 7.7 % — ABNORMAL HIGH (ref 4.8–5.9)

## 2014-10-30 MED ORDER — ATORVASTATIN CALCIUM 10 MG PO TABS
10 MG | ORAL_TABLET | ORAL | Status: DC
Start: 2014-10-30 — End: 2015-01-02

## 2014-10-30 MED ORDER — AMLODIPINE BESYLATE 10 MG PO TABS
10 MG | ORAL_TABLET | ORAL | Status: DC
Start: 2014-10-30 — End: 2015-01-02

## 2014-10-30 MED ORDER — CEFDINIR 300 MG PO CAPS
300 MG | ORAL_CAPSULE | Freq: Two times a day (BID) | ORAL | Status: AC
Start: 2014-10-30 — End: 2014-11-09

## 2014-10-30 MED ORDER — ALPRAZOLAM 0.25 MG PO TABS
0.25 MG | ORAL_TABLET | Freq: Three times a day (TID) | ORAL | Status: DC | PRN
Start: 2014-10-30 — End: 2014-12-19

## 2014-10-30 NOTE — Progress Notes (Signed)
Lori Chase 74 y.o. female presents today with   Chief Complaint   Patient presents with   ??? Aphasia     pt states she woke up (jumped up) screaming and shaking in the middle of the night (reason unknown) and could not speak. Pt states she'e taking Invokana and is concerned about the FDA warning of heart attack or stroke. Pt also states she's on amoxicillin and has never had a reaction before but has diarrhea. Paramedics were called and after taking vitals which were WNL pt refused transport to the hospital.       HPI Comments: Pt states she woke up (jumped up) screaming and shaking in the middle of the night (reason unknown) and could not speak. Pt states she'e taking Invokana and is concerned about the FDA warning of heart attack or stroke. Pt also states she's on amoxicillin and has never had a reaction before but has diarrhea. Paramedics were called and after taking vitals which were WNL pt refused transport to the hospital.      Past Medical History   Diagnosis Date   ??? Anxiety    ??? Cancer (HCC)      breast   ??? Colon polyp    ??? Hypertension    ??? Right knee pain 01/07/2012   ??? 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 Type II or unspecified type diabetes mellitus without mention of complication, not stated as uncontrolled      Patient Active Problem List    Diagnosis Date Noted   ??? Transient cerebral ischemia 10/30/2014   ??? Primary osteoarthritis of left knee 10/29/2014   ??? Other and unspecified hyperlipidemia 04/03/2014   ??? Primary osteoarthritis of both knees 01/21/2014   ??? 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 OA (osteoarthritis) of knee 07/09/2013   ??? Abnormal mammogram with microcalcification 02/02/2013   ??? 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 OA (osteoarthritis) 12/09/2011   ??? Symptomatic menopausal or female climacteric states 07/26/2011   ??? Breast cancer ([place]) 03/01/2011   ??? GERD (gastroesophageal reflux disease) 11/04/2010   ??? Osteopenia 11/04/2010   ??? HIGH CHOLESTEROL  07/16/2010   ??? Type II or unspecified type diabetes mellitus without mention of complication, not stated as uncontrolled    ??? Hypertension    ??? History of malignant neoplasm of breast 12/29/2005     Past Surgical History   Procedure Laterality Date   ??? Cholecystectomy     ??? Tonsillectomy  age 56   ??? Breast lumpectomy  2007   ??? Colonoscopy  09/18/12     [name]     Family History   Problem Relation Age of Onset   ??? High Blood Pressure Mother    ??? High Blood Pressure Father      Social History     Social History   ??? Marital status: Married     Spouse name: N/A   ??? Number of children: N/A   ??? Years of education: N/A     Social History Main Topics   ??? Smoking status: Never Smoker   ??? Smokeless tobacco: Never Used   ??? Alcohol use Yes      Comment: occ wine   ??? Drug use: No   ??? Sexual activity: Not Asked     Other Topics Concern   ??? None     Social History Narrative     Allergies   Allergen Reactions   ??? Morphine    ???  Oxycontin [Oxycodone]    ??? Penicillins Other (See Comments)       Review of Systems   Constitutional: Negative for activity change and appetite change.   Eyes: Negative for discharge.   Respiratory: Negative for apnea and chest tightness.    Cardiovascular: Negative for chest pain and palpitations.   Gastrointestinal: Positive for diarrhea. Negative for abdominal distention and abdominal pain.   Endocrine: Positive for polydipsia.           Vitals:    10/30/14 1217   BP: 148/74   Pulse: 92   Resp: 16   Temp: 98.3 ??F (36.8 ??C)   Weight: 150 lb (68 kg)   Height: 5' 6.5" (1.689 m)       Physical Exam      PHYSICAL EXAMINATION:   VITAL SIGNS: are as recorded.  GENERAL:  The patient appears well nourished and well-developed, and with normal affect.  No acute respiratory distress. Alert and oriented times 3. No skin rashes.     HEENT:  TMs normal bilaterally.  Canals and ears normal. Pharynx is clear. Extraocular eye motions intact and pain free.   Pupils reactive and equally round.  Sclerae and conjunctivae  clear, normocephalic and atraumatic.      RESPIRATORY:  Clear and equal breath sounds with no acute respiratory distress.       HEART: Regular rhythm without murmur, rub or gallop.     ABDOMEN:  soft, nontender. No masses, guarding or rebound. Normoactive bowel sounds.     NECK: No masses or adenopathy palpable.  No bruits heard. No asymmetry visible.     Neuro wnl    Assessment/Plan  Lori Chase was seen today for aphasia.    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 FOOT EXAM  04/09/2014   ??? OPHTHALMOLOGY EXAM  04/09/2014       Controlled Substances Monitoring:      Return in about 8 days (around 11/07/2014).    Ignacia Felling, MD

## 2014-10-30 NOTE — Telephone Encounter (Signed)
Medication approved & sent through electronically.

## 2014-11-04 ENCOUNTER — Encounter

## 2014-11-04 NOTE — Telephone Encounter (Signed)
Pt said she cannot do the MRI because she in fear of being closed in and the scheduler to her it would like about 45 minutes, so she is wanting to know if you have any other suggestions? She did schedule her carotid testing for this Fri.

## 2014-11-04 NOTE — Telephone Encounter (Signed)
Have her get a CT of brain instead

## 2014-11-04 NOTE — Telephone Encounter (Signed)
Done, [facility] will call her to schedule

## 2014-11-05 ENCOUNTER — Encounter

## 2014-11-05 NOTE — Telephone Encounter (Signed)
Notified patient that [facility] will contact her regarding the CT scan

## 2014-11-07 ENCOUNTER — Encounter: Attending: Family Medicine | Primary: Family Medicine

## 2014-11-13 ENCOUNTER — Ambulatory Visit: Admit: 2014-11-13 | Discharge: 2014-11-13 | Payer: MEDICARE | Attending: Family Medicine | Primary: Family Medicine

## 2014-11-13 ENCOUNTER — Encounter: Payer: MEDICARE | Attending: Family Medicine | Primary: Family Medicine

## 2014-11-13 DIAGNOSIS — I10 Essential (primary) hypertension: Secondary | ICD-10-CM

## 2014-11-13 NOTE — Progress Notes (Signed)
Lori Chase 74 y.o. female presents today with   Chief Complaint   Patient presents with   ??? Knee Pain     patient c/o left knee pain.   ??? Results     patient would like to discuss results of blood work and bone density test.       Knee Pain    The incident occurred more than 1 week ago. The pain is present in the left knee. The quality of the pain is described as aching. The pain is moderate. Pertinent negatives include no loss of sensation, numbness or tingling.       Past Medical History   Diagnosis Date   ??? Anxiety    ??? Cancer (HCC)      breast   ??? Colon polyp    ??? Hypertension    ??? Right knee pain 01/07/2012   ??? 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 Type II or unspecified type diabetes mellitus without mention of complication, not stated as uncontrolled      Patient Active Problem List    Diagnosis Date Noted   ??? Transient cerebral ischemia 10/30/2014   ??? Primary osteoarthritis of left knee 10/29/2014   ??? Other and unspecified hyperlipidemia 04/03/2014   ??? Primary osteoarthritis of both knees 01/21/2014   ??? DM type 2 with diabetic dyslipidemia ([place]) 10/10/2013   ??? OA (osteoarthritis) of knee 07/09/2013   ??? Abnormal mammogram with microcalcification 02/02/2013   ??? 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 OA (osteoarthritis) 12/09/2011   ??? Symptomatic menopausal or female climacteric states 07/26/2011   ??? Breast cancer ([place]) 03/01/2011   ??? GERD (gastroesophageal reflux disease) 11/04/2010   ??? Osteopenia 11/04/2010   ??? HIGH CHOLESTEROL 07/16/2010   ??? Type II or unspecified type diabetes mellitus without mention of complication, not stated as uncontrolled    ??? Hypertension    ??? History of malignant neoplasm of breast 12/29/2005     Past Surgical History   Procedure Laterality Date   ??? Cholecystectomy     ??? Tonsillectomy  age 61   ??? Breast lumpectomy  2007   ??? Colonoscopy  09/18/12     [name]     Family History   Problem Relation Age of Onset   ??? High Blood Pressure Mother    ??? High Blood Pressure  Father      Social History     Social History   ??? Marital status: Married     Spouse name: N/A   ??? Number of children: N/A   ??? Years of education: N/A     Social History Main Topics   ??? Smoking status: Never Smoker   ??? Smokeless tobacco: Never Used   ??? Alcohol use Yes      Comment: occ wine   ??? Drug use: No   ??? Sexual activity: Not Asked     Other Topics Concern   ??? None     Social History Narrative     Allergies   Allergen Reactions   ??? Morphine    ??? Oxycontin [Oxycodone]    ??? Penicillins Other (See Comments)       Review of Systems   Respiratory: Negative for chest tightness and shortness of breath.    Neurological: Negative for dizziness, tingling, numbness and headaches.         FEMALE ROS:    REVIEW OF SYSTEMS:  The patient reports no problems with hearing or headaches.  She reports no problems with vision. She is advised to have a yearly eye examination. She has no chest pains or pressures, or shortness of breath.  She has no indigestion, heartburn, nausea, or vomiting.   She has no constipation, diarrhea, or  black, bloody, mucousy, or tarry stool.  She has no trouble passing urine or losing urine.  Libido seems to be normal.  No paresthesias or loss of strength.          Vitals:    11/13/14 1358   BP: 116/74   Site: Right Arm   Position: Sitting   Cuff Size: Medium Adult   Pulse: 92   Resp: 13   Temp: 98.1 ??F (36.7 ??C)   TempSrc: Tympanic   SpO2: 98%   Weight: 148 lb 9.6 oz (67.4 kg)   Height: 5' 6.5" (1.689 m)       Physical Exam      PHYSICAL EXAMINATION:   VITAL SIGNS: are as recorded.  GENERAL:  The patient appears well nourished and well-developed, and with normal affect.  No acute respiratory distress. Alert and oriented times 3. No skin rashes.     HEENT:  TMs normal bilaterally.  Canals and ears normal. Pharynx is clear. Extraocular eye motions intact and pain free.   Pupils reactive and equally round.  Sclerae and conjunctivae clear, normocephalic and atraumatic.      RESPIRATORY:  Clear and equal  breath sounds with no acute respiratory distress.       HEART: Regular rhythm without murmur, rub or gallop.     ABDOMEN:  soft, nontender. No masses, guarding or rebound. Normoactive bowel sounds.     NECK: No masses or adenopathy palpable.  No bruits heard. No asymmetry visible.     oa b knees    Assessment/Plan  Lafaye was seen today for knee pain and results.    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 FOOT EXAM  04/09/2014   ??? OPHTHALMOLOGY EXAM  04/09/2014       Controlled Substances Monitoring:      Return in about 2 months (around 01/13/2015).    Ignacia Felling, MD

## 2014-11-19 NOTE — Telephone Encounter (Signed)
Patient was last seen 11/13/14    Please approve or deny this request.

## 2014-11-20 MED ORDER — NEXIUM 40 MG PO CPDR
40 MG | ORAL_CAPSULE | ORAL | 0 refills | Status: DC
Start: 2014-11-20 — End: 2015-01-02

## 2014-11-27 NOTE — Telephone Encounter (Signed)
Medication approved & sent through electronically.

## 2014-12-01 NOTE — Telephone Encounter (Signed)
Refill. Last ov 11/13/2014

## 2014-12-02 MED ORDER — GLIMEPIRIDE 4 MG PO TABS
4 MG | ORAL_TABLET | ORAL | 0 refills | Status: DC
Start: 2014-12-02 — End: 2015-01-02

## 2014-12-03 NOTE — Progress Notes (Signed)
Letter sent

## 2014-12-19 ENCOUNTER — Ambulatory Visit: Admit: 2014-12-19 | Discharge: 2014-12-19 | Payer: MEDICARE | Attending: Family Medicine | Primary: Family Medicine

## 2014-12-19 ENCOUNTER — Encounter: Attending: Family Medicine | Primary: Family Medicine

## 2014-12-19 DIAGNOSIS — E785 Hyperlipidemia, unspecified: Secondary | ICD-10-CM

## 2014-12-19 LAB — POCT GLUCOSE: Glucose: 205 mg/dL

## 2014-12-19 MED ORDER — MECLIZINE HCL 25 MG PO TABS
25 MG | ORAL_TABLET | Freq: Three times a day (TID) | ORAL | 0 refills | Status: DC | PRN
Start: 2014-12-19 — End: 2014-12-19

## 2014-12-19 MED ORDER — DAPAGLIFLOZIN PROPANEDIOL 10 MG PO TABS
10 MG | ORAL_TABLET | Freq: Every morning | ORAL | 0 refills | Status: DC
Start: 2014-12-19 — End: 2015-06-06

## 2014-12-19 MED ORDER — ALPRAZOLAM 0.25 MG PO TABS
0.25 MG | ORAL_TABLET | Freq: Three times a day (TID) | ORAL | 0 refills | Status: AC | PRN
Start: 2014-12-19 — End: 2015-01-18

## 2014-12-19 MED ORDER — MECLIZINE HCL 25 MG PO TABS
25 MG | ORAL_TABLET | Freq: Three times a day (TID) | ORAL | 0 refills | Status: DC | PRN
Start: 2014-12-19 — End: 2015-10-07

## 2014-12-19 NOTE — Progress Notes (Signed)
Lori Chase 74 y.o. female presents today with   Chief Complaint   Patient presents with   ??? Dizziness     patient c/o vertigo, nausea and vomitting x 2 days.       Dizziness   This is a new problem. The current episode started yesterday. The problem occurs constantly. The problem has been unchanged. Associated symptoms include fatigue, nausea, vertigo and vomiting. Pertinent negatives include no chest pain, fever or headaches. Nothing (lying down) aggravates the symptoms. She has tried relaxation and sleep for the symptoms. The treatment provided no relief.       Past Medical History   Diagnosis Date   ??? Anxiety    ??? Cancer (HCC)      breast   ??? Colon polyp    ??? Hypertension    ??? Osteopenia      last DXA 10/2014   ??? Right knee pain 01/07/2012   ??? 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 Type II or unspecified type diabetes mellitus without mention of complication, not stated as uncontrolled      Patient Active Problem List    Diagnosis Date Noted   ??? Transient cerebral ischemia 10/30/2014   ??? Primary osteoarthritis of left knee 10/29/2014   ??? Other and unspecified hyperlipidemia 04/03/2014   ??? Primary osteoarthritis of both knees 01/21/2014   ??? 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 OA (osteoarthritis) of knee 07/09/2013   ??? Abnormal mammogram with microcalcification 02/02/2013   ??? 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 OA (osteoarthritis) 12/09/2011   ??? Symptomatic menopausal or female climacteric states 07/26/2011   ??? Breast cancer ([place]) 03/01/2011   ??? GERD (gastroesophageal reflux disease) 11/04/2010   ??? Osteopenia 11/04/2010   ??? HIGH CHOLESTEROL 07/16/2010   ??? Type II or unspecified type diabetes mellitus without mention of complication, not stated as uncontrolled    ??? Hypertension    ??? History of malignant neoplasm of breast 12/29/2005     Past Surgical History   Procedure Laterality Date   ??? Cholecystectomy     ??? Tonsillectomy  age 36   ??? Breast lumpectomy  2007   ??? Colonoscopy   09/18/12     [name]     Family History   Problem Relation Age of Onset   ??? High Blood Pressure Mother    ??? High Blood Pressure Father      Social History     Social History   ??? Marital status: Married     Spouse name: N/A   ??? Number of children: N/A   ??? Years of education: N/A     Social History Main Topics   ??? Smoking status: Never Smoker   ??? Smokeless tobacco: Never Used   ??? Alcohol use Yes      Comment: occ wine   ??? Drug use: No   ??? Sexual activity: Not Asked     Other Topics Concern   ??? None     Social History Narrative     Allergies   Allergen Reactions   ??? Morphine    ??? Oxycontin [Oxycodone]    ??? Penicillins Other (See Comments)       Review of Systems   Constitutional: Positive for fatigue. Negative for fever.   Respiratory: Negative for chest tightness and shortness of breath.    Cardiovascular: Negative for chest pain.   Gastrointestinal: Positive for nausea and vomiting.   Neurological: Positive  for dizziness and vertigo. Negative for headaches.           Vitals:    12/19/14 1415   BP: 164/80   Pulse: 84   Resp: 12   Temp: 96.8 ??F (36 ??C)   TempSrc: Tympanic   SpO2: 97%   Weight: 145 lb 12.8 oz (66.1 kg)   Height: 5' 6.5" (1.689 m)       Physical Exam      PHYSICAL EXAMINATION:   VITAL SIGNS: are as recorded.  GENERAL:  The patient appears well nourished and well-developed, and with normal affect.  No acute respiratory distress. Alert and oriented times 3. No skin rashes.     HEENT:  TMs normal bilaterally.  Canals and ears normal. Pharynx is clear. Extraocular eye motions intact and pain free.   Pupils reactive and equally round.  Sclerae and conjunctivae clear, normocephalic and atraumatic.      RESPIRATORY:  Clear and equal breath sounds with no acute respiratory distress.       HEART: Regular rhythm without murmur, rub or gallop.     ABDOMEN:  soft, nontender. No masses, guarding or rebound. Normoactive bowel sounds.     NECK: No masses or adenopathy palpable.  No bruits heard. No asymmetry visible.      Neuro wnl    Assessment/Plan  Joletta was seen today for dizziness.    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  -     ALPRAZolam (XANAX) 0.25 MG tablet; Take 1 tablet by mouth 3 times daily as needed for Anxiety  -     meclizine (ANTIVERT) 25 MG tablet; Take 1 tablet by mouth 3 times daily as needed (tid prn) Take one (1) tablet one (1) hour before travel, repeat every 12-24 hours if needed.         If patient would develop any chest pain, shortness of breath or symptoms related to either that last >15 minutes, patient is to go to the ER.        Health Maintenance Due   Topic Date Due   ??? FOOT EXAM  04/09/2014   ??? OPHTHALMOLOGY EXAM  04/09/2014   ??? Tetanus (DTaP/Tdap/Td) (1 - Tdap) 07/23/2014   ??? Flu Vaccine (1) 12/23/2014       Controlled Substances Monitoring:      Return in about 4 days (around 12/23/2014).    Ignacia Felling, MD

## 2014-12-23 ENCOUNTER — Ambulatory Visit: Admit: 2014-12-23 | Discharge: 2014-12-23 | Payer: MEDICARE | Attending: Family Medicine | Primary: Family Medicine

## 2014-12-23 DIAGNOSIS — R42 Dizziness and giddiness: Secondary | ICD-10-CM

## 2014-12-23 NOTE — Progress Notes (Signed)
Abram Sander 74 y.o. female presents today with   Chief Complaint   Patient presents with   ??? Dizziness     patient following up x 4 days. patient states she is not as dizzy today and is starting to feel better.        Dizziness   This is a new problem. The current episode started in the past 7 days. The problem occurs constantly. The problem has been rapidly improving. Pertinent negatives include no chest pain, fatigue, headaches, nausea, vertigo or vomiting. Nothing aggravates the symptoms. She has tried rest for the symptoms.       Past Medical History   Diagnosis Date   ??? Anxiety    ??? Cancer (HCC)      breast   ??? Colon polyp    ??? Hypertension    ??? Osteopenia      last DXA 10/2014   ??? Right knee pain 01/07/2012   ??? 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 Type II or unspecified type diabetes mellitus without mention of complication, not stated as uncontrolled      Patient Active Problem List    Diagnosis Date Noted   ??? Transient cerebral ischemia 10/30/2014   ??? Primary osteoarthritis of left knee 10/29/2014   ??? Other and unspecified hyperlipidemia 04/03/2014   ??? Primary osteoarthritis of both knees 01/21/2014   ??? 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 OA (osteoarthritis) of knee 07/09/2013   ??? Abnormal mammogram with microcalcification 02/02/2013   ??? 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 OA (osteoarthritis) 12/09/2011   ??? Symptomatic menopausal or female climacteric states 07/26/2011   ??? Breast cancer ([place]) 03/01/2011   ??? GERD (gastroesophageal reflux disease) 11/04/2010   ??? Osteopenia 11/04/2010   ??? HIGH CHOLESTEROL 07/16/2010   ??? Type II or unspecified type diabetes mellitus without mention of complication, not stated as uncontrolled    ??? Hypertension    ??? History of malignant neoplasm of breast 12/29/2005     Past Surgical History   Procedure Laterality Date   ??? Cholecystectomy     ??? Tonsillectomy  age 22   ??? Breast lumpectomy  2007   ??? Colonoscopy  09/18/12     [name]      Family History   Problem Relation Age of Onset   ??? High Blood Pressure Mother    ??? High Blood Pressure Father      Social History     Social History   ??? Marital status: Married     Spouse name: N/A   ??? Number of children: N/A   ??? Years of education: N/A     Social History Main Topics   ??? Smoking status: Never Smoker   ??? Smokeless tobacco: Never Used   ??? Alcohol use Yes      Comment: occ wine   ??? Drug use: No   ??? Sexual activity: Not Asked     Other Topics Concern   ??? None     Social History Narrative     Allergies   Allergen Reactions   ??? Morphine    ??? Oxycontin [Oxycodone]    ??? Penicillins Other (See Comments)       Review of Systems   Constitutional: Positive for appetite change. Negative for fatigue.   Respiratory: Negative for chest tightness.    Cardiovascular: Negative for chest pain.   Gastrointestinal: Negative for nausea and vomiting.   Neurological: Positive  for dizziness. Negative for vertigo and headaches.           Vitals:    12/23/14 1357   BP: 130/66   Pulse: 90   Resp: 14   Temp: 97.6 ??F (36.4 ??C)   TempSrc: Tympanic   SpO2: 98%   Weight: 147 lb (66.7 kg)   Height: 5' 6.5" (1.689 m)       Physical Exam      PHYSICAL EXAMINATION:   VITAL SIGNS: are as recorded.  GENERAL:  The patient appears well nourished and well-developed, and with normal affect.  No acute respiratory distress. Alert and oriented times 3. No skin rashes.     HEENT:  TMs normal bilaterally.  Canals and ears normal. Pharynx is clear. Extraocular eye motions intact and pain free.   Pupils reactive and equally round.  Sclerae and conjunctivae clear, normocephalic and atraumatic.      RESPIRATORY:  Clear and equal breath sounds with no acute respiratory distress.       HEART: Regular rhythm without murmur, rub or gallop.     ABDOMEN:  soft, nontender. No masses, guarding or rebound. Normoactive bowel sounds.     NECK: No masses or adenopathy palpable.  No bruits heard. No asymmetry visible.     Neuro wnl  Assessment/Plan  Paysley was  seen today for dizziness.    Diagnoses and all orders for this visit:    Vertigo    Essential hypertension    Gastroesophageal reflux disease with esophagitis    Primary osteoarthritis involving multiple joints    Other and unspecified hyperlipidemia          Health Maintenance Due   Topic Date Due   ??? FOOT EXAM  04/09/2014   ??? OPHTHALMOLOGY EXAM  04/09/2014   ??? Tetanus (DTaP/Tdap/Td) (1 - Tdap) 07/23/2014   ??? Flu Vaccine (1) 12/23/2014       Controlled Substances Monitoring:      Return in about 10 days (around 01/02/2015).    Ignacia Felling, MD

## 2014-12-24 NOTE — Telephone Encounter (Signed)
Last seen 12/23/14

## 2014-12-25 ENCOUNTER — Encounter

## 2014-12-25 MED ORDER — SITAGLIPTIN PHOSPHATE 100 MG PO TABS
100 MG | ORAL_TABLET | ORAL | 1 refills | Status: DC
Start: 2014-12-25 — End: 2014-12-25

## 2014-12-25 MED ORDER — SITAGLIPTIN PHOSPHATE 100 MG PO TABS
100 MG | ORAL_TABLET | ORAL | 1 refills | Status: DC
Start: 2014-12-25 — End: 2014-12-26

## 2014-12-25 MED ORDER — METFORMIN HCL ER 500 MG PO TB24
500 MG | ORAL_TABLET | ORAL | 1 refills | Status: DC
Start: 2014-12-25 — End: 2015-01-02

## 2014-12-25 NOTE — Telephone Encounter (Signed)
Pt called requesting Rx Refill  Last OV 12/23/2014

## 2014-12-26 ENCOUNTER — Encounter

## 2014-12-26 MED ORDER — SITAGLIPTIN PHOSPHATE 100 MG PO TABS
100 MG | ORAL_TABLET | ORAL | 1 refills | Status: DC
Start: 2014-12-26 — End: 2015-01-02

## 2014-12-26 NOTE — Telephone Encounter (Signed)
Pt called and said that [facility] have not received approved medication januvia.

## 2015-01-02 ENCOUNTER — Ambulatory Visit: Admit: 2015-01-02 | Discharge: 2015-01-02 | Payer: MEDICARE | Attending: Family Medicine | Primary: Family Medicine

## 2015-01-02 DIAGNOSIS — E785 Hyperlipidemia, unspecified: Secondary | ICD-10-CM

## 2015-01-02 MED ORDER — VALSARTAN 80 MG PO TABS
80 MG | ORAL_TABLET | ORAL | 1 refills | Status: DC
Start: 2015-01-02 — End: 2015-03-20

## 2015-01-02 MED ORDER — METFORMIN HCL ER 500 MG PO TB24
500 MG | ORAL_TABLET | ORAL | 1 refills | Status: DC
Start: 2015-01-02 — End: 2015-03-20

## 2015-01-02 MED ORDER — ATORVASTATIN CALCIUM 10 MG PO TABS
10 MG | ORAL_TABLET | ORAL | 1 refills | Status: DC
Start: 2015-01-02 — End: 2015-03-20

## 2015-01-02 MED ORDER — ESOMEPRAZOLE MAGNESIUM 40 MG PO CPDR
40 MG | ORAL_CAPSULE | ORAL | 1 refills | Status: DC
Start: 2015-01-02 — End: 2015-03-20

## 2015-01-02 MED ORDER — ALENDRONATE SODIUM 70 MG PO TABS
70 MG | ORAL_TABLET | ORAL | 3 refills | Status: DC
Start: 2015-01-02 — End: 2015-03-20

## 2015-01-02 MED ORDER — GLIMEPIRIDE 4 MG PO TABS
4 MG | ORAL_TABLET | ORAL | 1 refills | Status: DC
Start: 2015-01-02 — End: 2015-03-20

## 2015-01-02 MED ORDER — AMLODIPINE BESYLATE 10 MG PO TABS
10 MG | ORAL_TABLET | ORAL | 1 refills | Status: DC
Start: 2015-01-02 — End: 2015-03-20

## 2015-01-02 MED ORDER — SITAGLIPTIN PHOSPHATE 100 MG PO TABS
100 MG | ORAL_TABLET | ORAL | 1 refills | Status: DC
Start: 2015-01-02 — End: 2015-03-20

## 2015-01-02 NOTE — Progress Notes (Signed)
Lori Chase 74 y.o. female presents today with   Chief Complaint   Patient presents with   ??? Dizziness     pt states problem is getting better but when she bends to pick something up she gets slightly dizzy.       Dizziness   This is a new problem. The current episode started 1 to 4 weeks ago. The problem occurs rarely. The problem has been gradually improving. Pertinent negatives include no chest pain, congestion, headaches, nausea, vomiting or weakness. The symptoms are aggravated by bending.     Fu dm,htn,lipids    Past Medical History   Diagnosis Date   ??? Anxiety    ??? Cancer (HCC)      breast   ??? Colon polyp    ??? Hypertension    ??? Osteopenia      last DXA 10/2014   ??? Right knee pain 01/07/2012   ??? 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 Type II or unspecified type diabetes mellitus without mention of complication, not stated as uncontrolled      Patient Active Problem List    Diagnosis Date Noted   ??? Transient cerebral ischemia 10/30/2014   ??? Primary osteoarthritis of left knee 10/29/2014   ??? Other and unspecified hyperlipidemia 04/03/2014   ??? Primary osteoarthritis of both knees 01/21/2014   ??? 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 OA (osteoarthritis) of knee 07/09/2013   ??? Abnormal mammogram with microcalcification 02/02/2013   ??? 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 OA (osteoarthritis) 12/09/2011   ??? Symptomatic menopausal or female climacteric states 07/26/2011   ??? Breast cancer ([place]) 03/01/2011   ??? GERD (gastroesophageal reflux disease) 11/04/2010   ??? Osteopenia 11/04/2010   ??? HIGH CHOLESTEROL 07/16/2010   ??? Type II or unspecified type diabetes mellitus without mention of complication, not stated as uncontrolled    ??? Hypertension    ??? History of malignant neoplasm of breast 12/29/2005     Past Surgical History   Procedure Laterality Date   ??? Cholecystectomy     ??? Tonsillectomy  age 32   ??? Breast lumpectomy  2007   ??? Colonoscopy  09/18/12     [name]     Family History    Problem Relation Age of Onset   ??? High Blood Pressure Mother    ??? High Blood Pressure Father      Social History     Social History   ??? Marital status: Married     Spouse name: N/A   ??? Number of children: N/A   ??? Years of education: N/A     Social History Main Topics   ??? Smoking status: Never Smoker   ??? Smokeless tobacco: Never Used   ??? Alcohol use Yes      Comment: occ wine   ??? Drug use: No   ??? Sexual activity: Not Asked     Other Topics Concern   ??? None     Social History Narrative     Allergies   Allergen Reactions   ??? Morphine    ??? Oxycontin [Oxycodone]    ??? Penicillins Other (See Comments)       Review of Systems   Constitutional: Negative for activity change.   HENT: Negative for congestion.    Eyes: Negative for discharge.   Respiratory: Negative for apnea, choking and chest tightness.    Cardiovascular: Negative for chest pain and leg swelling.  Gastrointestinal: Negative for nausea and vomiting.   Neurological: Positive for dizziness. Negative for facial asymmetry, weakness, light-headedness and headaches.           Vitals:    01/02/15 1348   BP: 124/56   Site: Right Arm   Position: Sitting   Cuff Size: Large Adult   Pulse: 80   Resp: 14   Temp: 97.5 ??F (36.4 ??C)   Weight: 149 lb (67.6 kg)   Height: 5' 6.5" (1.689 m)       Physical Exam      PHYSICAL EXAMINATION:   VITAL SIGNS: are as recorded.  GENERAL:  The patient appears well nourished and well-developed, and with normal affect.  No acute respiratory distress. Alert and oriented times 3. No skin rashes.     HEENT:  TMs normal bilaterally.  Canals and ears normal. Pharynx is clear. Extraocular eye motions intact and pain free.   Pupils reactive and equally round.  Sclerae and conjunctivae clear, normocephalic and atraumatic.      RESPIRATORY:  Clear and equal breath sounds with no acute respiratory distress.       HEART: Regular rhythm without murmur, rub or gallop.     ABDOMEN:  soft, nontender. No masses, guarding or rebound. Normoactive bowel  sounds.     LOW BACK: No tenderness to palpation of inferior lumbar spine or either sacroiliac joint area.         Assessment/Plan  Lori Chase was seen today for dizziness.    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  -     glimepiride (AMARYL) 4 MG tablet; TAKE 2 TABLETS DAILY  -     atorvastatin (LIPITOR) 10 MG tablet; TAKE 1 TABLET BY MOUTH EVERY DAY.    Essential hypertension  Orders:  -     amLODIPine (NORVASC) 10 MG tablet; TAKE 1 TABLET BY MOUTH EVERY DAY.    Osteopenia  Orders:  -     alendronate (FOSAMAX) 70 MG tablet; TAKE 1 TABLET BY MOUTH ONCE A WEEK    Osteoporosis  Orders:  -     alendronate (FOSAMAX) 70 MG tablet; TAKE 1 TABLET BY MOUTH ONCE A WEEK    Other and unspecified hyperlipidemia    Other orders  -     esomeprazole (NEXIUM) 40 MG capsule; TAKE 1 CAPSULE EVERY MORNING BEFORE BREAKFAST  -     valsartan (DIOVAN) 80 MG tablet; TAKE 1 TABLET BY MOUTH DAILY      dx stable      Health Maintenance Due   Topic Date Due   ??? FOOT EXAM  04/09/2014   ??? OPHTHALMOLOGY EXAM  04/09/2014   ??? Tetanus (DTaP/Tdap/Td) (1 - Tdap) 07/23/2014   ??? Flu Vaccine (1) 12/23/2014       Controlled Substances Monitoring:      Return in about 4 weeks (around 01/30/2015).    Ignacia Felling, MD

## 2015-01-13 ENCOUNTER — Encounter: Payer: MEDICARE | Attending: Family Medicine | Primary: Family Medicine

## 2015-02-06 ENCOUNTER — Ambulatory Visit: Admit: 2015-02-06 | Discharge: 2015-02-06 | Payer: MEDICARE | Attending: Family Medicine | Primary: Family Medicine

## 2015-02-06 DIAGNOSIS — I10 Essential (primary) hypertension: Secondary | ICD-10-CM

## 2015-02-06 LAB — HEMOGLOBIN A1C: Hemoglobin A1C: 9.5 % — ABNORMAL HIGH (ref 4.8–5.9)

## 2015-02-06 MED ORDER — CYANOCOBALAMIN 1000 MCG/ML IJ SOLN
1000 MCG/ML | Freq: Once | INTRAMUSCULAR | Status: AC
Start: 2015-02-06 — End: 2015-02-06
  Administered 2015-02-06: 20:00:00 1000 ug via INTRAMUSCULAR

## 2015-02-06 NOTE — Progress Notes (Signed)
Vaccine Information Sheet, "Influenza - Inactivated" OR "Live - Intranasal"  given to Lori Chase, or parent/legal guardian of  Lori Chase and verbalized understanding.    Patient responses:    Have you ever had a reaction to a flu vaccine? No  Are you able to eat eggs without adverse effects?  Yes  Do you have any current illness?  No  Have you ever had Guillian Barre Syndrome?  No    Flu vaccine given per order. Please see immunization tab.

## 2015-02-06 NOTE — Progress Notes (Signed)
Lori Chase 74 y.o. female presents today with   Chief Complaint   Patient presents with   ??? Dizziness     patient following up x 5 weeks.   ??? Flu Vaccine       Dizziness   The problem has been resolved. Associated symptoms include fatigue. Pertinent negatives include no abdominal pain, chest pain, headaches or vertigo.     Fu dm,htn,gerd    Past Medical History   Diagnosis Date   ??? Anxiety    ??? Cancer (HCC)      breast   ??? Colon polyp    ??? Hypertension    ??? Osteopenia      last DXA 10/2014   ??? Right knee pain 01/07/2012   ??? 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 Type II or unspecified type diabetes mellitus without mention of complication, not stated as uncontrolled      Patient Active Problem List    Diagnosis Date Noted   ??? Transient cerebral ischemia 10/30/2014   ??? Primary osteoarthritis of left knee 10/29/2014   ??? Other and unspecified hyperlipidemia 04/03/2014   ??? Primary osteoarthritis of both knees 01/21/2014   ??? 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 OA (osteoarthritis) of knee 07/09/2013   ??? Abnormal mammogram with microcalcification 02/02/2013   ??? 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 OA (osteoarthritis) 12/09/2011   ??? Symptomatic menopausal or female climacteric states 07/26/2011   ??? Breast cancer ([place]) 03/01/2011   ??? GERD (gastroesophageal reflux disease) 11/04/2010   ??? Osteopenia 11/04/2010   ??? HIGH CHOLESTEROL 07/16/2010   ??? Type II or unspecified type diabetes mellitus without mention of complication, not stated as uncontrolled    ??? Hypertension    ??? History of malignant neoplasm of breast 12/29/2005     Past Surgical History   Procedure Laterality Date   ??? Cholecystectomy     ??? Tonsillectomy  age 34   ??? Breast lumpectomy  2007   ??? Colonoscopy  09/18/12     [name]     Family History   Problem Relation Age of Onset   ??? High Blood Pressure Mother    ??? High Blood Pressure Father      Social History     Social History   ??? Marital status: Married     Spouse name: N/A   ???  Number of children: N/A   ??? Years of education: N/A     Social History Main Topics   ??? Smoking status: Never Smoker   ??? Smokeless tobacco: Never Used   ??? Alcohol use Yes      Comment: occ wine   ??? Drug use: No   ??? Sexual activity: Not Asked     Other Topics Concern   ??? None     Social History Narrative     Allergies   Allergen Reactions   ??? Morphine    ??? Oxycontin [Oxycodone]    ??? Penicillins Other (See Comments)       Review of Systems   Constitutional: Positive for fatigue. Negative for appetite change.   Eyes: Negative.    Respiratory: Negative for apnea, chest tightness, shortness of breath and wheezing.    Cardiovascular: Negative for chest pain, palpitations and leg swelling.   Gastrointestinal: Negative for abdominal distention and abdominal pain.   Neurological: Positive for dizziness. Negative for vertigo and headaches.  Vitals:    02/06/15 1435   BP: 120/72   Pulse: 88   Resp: 16   Temp: 98.2 ??F (36.8 ??C)   TempSrc: Tympanic   SpO2: 98%   Weight: 148 lb (67.1 kg)   Height: 5' 6.5" (1.689 m)       Physical Exam      PHYSICAL EXAMINATION:   VITAL SIGNS: are as recorded.  GENERAL:  The patient appears well nourished and well-developed, and with normal affect.  No acute respiratory distress. Alert and oriented times 3. No skin rashes.     HEENT:  TMs normal bilaterally.  Canals and ears normal. Pharynx is clear. Extraocular eye motions intact and pain free.   Pupils reactive and equally round.  Sclerae and conjunctivae clear, normocephalic and atraumatic.      RESPIRATORY:  Clear and equal breath sounds with no acute respiratory distress.       HEART: Regular rhythm without murmur, rub or gallop.     ABDOMEN:  soft, nontender. No masses, guarding or rebound. Normoactive bowel sounds.     NECK: No masses or adenopathy palpable.  No bruits heard. No asymmetry visible.     LOW BACK: No tenderness to palpation of inferior lumbar spine or either sacroiliac joint area.         Assessment/Plan  [name] was  seen today for dizziness and flu vaccine.    Diagnoses and all orders for this visit:    Essential hypertension  -     Lipid Panel  -     Hemoglobin A1C    Needs flu shot  -     INFLUENZA, HIGH DOSE, PF (FLUZONE, HIGH DOSE - 65+)    Gastroesophageal reflux disease with esophagitis    Primary osteoarthritis involving multiple joints    Pernicious anemia  -     CBC Auto Differential  -     TSH without Reflex    DM type 2 with diabetic dyslipidemia (HCC)  -     Comprehensive Metabolic Panel  -     Lipid Panel  -     Hemoglobin A1C    Fatigue, unspecified type  -     cyanocobalamin injection 1,000 mcg; Inject 1 mL into the muscle once      see bw     Dx stable      Health Maintenance Due   Topic Date Due   ??? FOOT EXAM  04/09/2014   ??? OPHTHALMOLOGY EXAM  04/09/2014   ??? DTaP/Tdap/Td vaccine (1 - Tdap) 07/23/2014       Controlled Substances Monitoring:      Return in about 6 weeks (around 03/20/2015).    Lori Felling, MD

## 2015-02-07 LAB — COMPREHENSIVE METABOLIC PANEL
ALT: 26 U/L (ref 0–33)
AST: 25 U/L (ref 0–35)
Albumin: 4.4 g/dL (ref 3.9–4.9)
Alkaline Phosphatase: 56 U/L (ref 40–130)
Anion Gap: 15 mEq/L — ABNORMAL HIGH (ref 7–13)
BUN: 17 mg/dL (ref 8–23)
CO2: 23 mEq/L (ref 22–29)
Calcium: 10.1 mg/dL (ref 8.6–10.2)
Chloride: 96 mEq/L — ABNORMAL LOW (ref 98–107)
Creatinine: 0.57 mg/dL (ref 0.50–0.90)
GFR African American: >60 (ref >60)
GFR Non-African American: >60 (ref >60)
Globulin: 3.5 g/dL (ref 2.3–3.5)
Glucose: 95 mg/dL (ref 74–109)
Potassium: 4.7 mEq/L (ref 3.5–5.1)
Sodium: 134 mEq/L (ref 132–144)
Total Bilirubin: 0.2 mg/dL (ref 0.0–1.2)
Total Protein: 7.9 g/dL (ref 6.4–8.1)

## 2015-02-07 LAB — CBC WITH DIFFERENTIAL
Basophils %: 0.3 %
Basophils Absolute: 0 10*3/uL (ref 0.0–0.2)
Eosinophils %: 2.1 %
Eosinophils Absolute: 0.3 10*3/uL (ref 0.0–0.7)
Hematocrit: 35.7 % — ABNORMAL LOW (ref 37.0–47.0)
Hemoglobin: 11.1 g/dL — ABNORMAL LOW (ref 12.0–16.0)
Lymphocytes %: 19.8 %
Lymphocytes Absolute: 2.5 10*3/uL (ref 1.0–4.8)
MCH: 22.4 pg — ABNORMAL LOW (ref 27.0–31.3)
MCHC: 31 % — ABNORMAL LOW (ref 33.0–37.0)
MCV: 72.2 fL — ABNORMAL LOW (ref 82.0–100.0)
Monocytes %: 7 %
Monocytes Absolute: 0.9 10*3/uL — ABNORMAL HIGH (ref 0.2–0.8)
Neutrophils %: 70.8 %
Neutrophils Absolute: 9.1 10*3/uL — ABNORMAL HIGH (ref 1.4–6.5)
PLATELET SLIDE REVIEW: NORMAL
Platelets: 250 10*3/uL (ref 130–400)
RBC: 4.94 M/uL (ref 4.20–5.40)
RDW: 19 % — ABNORMAL HIGH (ref 11.5–14.5)
WBC: 12.8 10*3/uL — ABNORMAL HIGH (ref 4.8–10.8)

## 2015-02-07 LAB — LIPID PANEL
Cholesterol, Total: 139 mg/dL (ref 0–199)
HDL: 47 mg/dL (ref 40–59)
LDL Calculated: 52 mg/dL (ref 0–129)
Triglycerides: 198 mg/dL (ref 0–200)

## 2015-02-07 LAB — TSH, HIGH SENSITIVE: TSH: 1.03 u[IU]/mL (ref 0.270–4.200)

## 2015-03-20 ENCOUNTER — Ambulatory Visit: Admit: 2015-03-20 | Discharge: 2015-03-20 | Payer: MEDICARE | Attending: Family Medicine | Primary: Family Medicine

## 2015-03-20 DIAGNOSIS — M170 Bilateral primary osteoarthritis of knee: Secondary | ICD-10-CM

## 2015-03-20 MED ORDER — BAYER BREEZE 2 SYSTEM W/DEVICE KIT
PACK | Freq: Every day | 0 refills | Status: DC
Start: 2015-03-20 — End: 2016-01-22

## 2015-03-20 MED ORDER — ATORVASTATIN CALCIUM 10 MG PO TABS
10 MG | ORAL_TABLET | ORAL | 1 refills | Status: DC
Start: 2015-03-20 — End: 2015-05-28

## 2015-03-20 MED ORDER — ALENDRONATE SODIUM 70 MG PO TABS
70 MG | ORAL_TABLET | ORAL | 3 refills | Status: DC
Start: 2015-03-20 — End: 2015-05-28

## 2015-03-20 MED ORDER — GLIMEPIRIDE 4 MG PO TABS
4 MG | ORAL_TABLET | ORAL | 1 refills | Status: DC
Start: 2015-03-20 — End: 2015-05-28

## 2015-03-20 MED ORDER — AMLODIPINE BESYLATE 10 MG PO TABS
10 MG | ORAL_TABLET | ORAL | 1 refills | Status: DC
Start: 2015-03-20 — End: 2015-05-28

## 2015-03-20 MED ORDER — VALSARTAN 80 MG PO TABS
80 MG | ORAL_TABLET | ORAL | 1 refills | Status: DC
Start: 2015-03-20 — End: 2015-05-28

## 2015-03-20 MED ORDER — METFORMIN HCL ER 500 MG PO TB24
500 MG | ORAL_TABLET | ORAL | 1 refills | Status: DC
Start: 2015-03-20 — End: 2015-05-28

## 2015-03-20 MED ORDER — SITAGLIPTIN PHOSPHATE 100 MG PO TABS
100 MG | ORAL_TABLET | ORAL | 1 refills | Status: DC
Start: 2015-03-20 — End: 2015-05-28

## 2015-03-20 MED ORDER — ESOMEPRAZOLE MAGNESIUM 40 MG PO CPDR
40 MG | ORAL_CAPSULE | ORAL | 1 refills | Status: DC
Start: 2015-03-20 — End: 2015-05-28

## 2015-03-20 NOTE — Progress Notes (Signed)
Lori Chase 74 y.o. female presents today with   Chief Complaint   Patient presents with   ??? Diabetes     Patient is here for a follow up.       Diabetes   She presents for her follow-up diabetic visit. She has type 2 diabetes mellitus. There are no hypoglycemic associated symptoms. Pertinent negatives for hypoglycemia include no confusion, dizziness, headaches, hunger, mood changes, nervousness/anxiousness, pallor, seizures, sleepiness, speech difficulty, sweats or tremors. There are no diabetic associated symptoms. Pertinent negatives for diabetes include no blurred vision, no chest pain, no fatigue, no foot paresthesias, no foot ulcerations, no polydipsia, no polyphagia, no polyuria, no visual change, no weakness and no weight loss. There are no hypoglycemic complications. There are no diabetic complications. Risk factors for coronary artery disease include hypertension, diabetes mellitus, dyslipidemia and post-menopausal. Current diabetic treatments: 4 oral therapies. She is compliant with treatment all of the time. Her weight is stable. She is following a generally healthy diet.     Fu oa,dm,lipids,htn    Past Medical History   Diagnosis Date   ??? Anxiety    ??? Cancer (HCC)      breast   ??? Colon polyp    ??? Hypertension    ??? Osteopenia      last DXA 10/2014   ??? Right knee pain 01/07/2012   ??? 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 Type II or unspecified type diabetes mellitus without mention of complication, not stated as uncontrolled      Patient Active Problem List    Diagnosis Date Noted   ??? Transient cerebral ischemia 10/30/2014   ??? Primary osteoarthritis of left knee 10/29/2014   ??? Other and unspecified hyperlipidemia 04/03/2014   ??? Primary osteoarthritis of both knees 01/21/2014   ??? DM type 2 with diabetic dyslipidemia ([place]) 10/10/2013   ??? OA (osteoarthritis) of knee 07/09/2013   ??? Abnormal mammogram with microcalcification 02/02/2013   ??? 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 OA (osteoarthritis)  12/09/2011   ??? Symptomatic menopausal or female climacteric states 07/26/2011   ??? Breast cancer ([place]) 03/01/2011   ??? GERD (gastroesophageal reflux disease) 11/04/2010   ??? Osteopenia 11/04/2010   ??? HIGH CHOLESTEROL 07/16/2010   ??? Type II or unspecified type diabetes mellitus without mention of complication, not stated as uncontrolled    ??? Hypertension    ??? History of malignant neoplasm of breast 12/29/2005     Past Surgical History   Procedure Laterality Date   ??? Cholecystectomy     ??? Tonsillectomy  age 48   ??? Breast lumpectomy  2007   ??? Colonoscopy  09/18/12     [name]     Family History   Problem Relation Age of Onset   ??? High Blood Pressure Mother    ??? High Blood Pressure Father      Social History     Social History   ??? Marital status: Married     Spouse name: N/A   ??? Number of children: N/A   ??? Years of education: N/A     Social History Main Topics   ??? Smoking status: Never Smoker   ??? Smokeless tobacco: Never Used   ??? Alcohol use Yes      Comment: occ wine   ??? Drug use: No   ??? Sexual activity: Not Asked     Other Topics Concern   ??? None     Social History Narrative  Allergies   Allergen Reactions   ??? Morphine    ??? Oxycontin [Oxycodone]    ??? Penicillins Other (See Comments)       Review of Systems   Constitutional: Negative for activity change, appetite change, chills, diaphoresis, fatigue, fever and weight loss.   Eyes: Negative for blurred vision.   Cardiovascular: Negative for chest pain.   Endocrine: Negative for polydipsia, polyphagia and polyuria.   Skin: Negative for pallor.   Neurological: Negative for dizziness, tremors, seizures, speech difficulty, weakness and headaches.   Psychiatric/Behavioral: Negative for confusion. The patient is not nervous/anxious.            Vitals:    03/20/15 1400 03/20/15 1434   BP: 146/60 132/58   Site: Right Arm    Position: Sitting    Cuff Size: Large Adult    Pulse: 84    Resp: 14    Temp: 97.2 ??F (36.2 ??C)    Weight: 150 lb (68 kg)    Height: 5' 6.5" (1.689 m)         Physical Exam      PHYSICAL EXAMINATION:   VITAL SIGNS: are as recorded.  GENERAL:  The patient appears well nourished and well-developed, and with normal affect.  No acute respiratory distress. Alert and oriented times 3. No skin rashes.     HEENT:  TMs normal bilaterally.  Canals and ears normal. Pharynx is clear. Extraocular eye motions intact and pain free.   Pupils reactive and equally round.  Sclerae and conjunctivae clear, normocephalic and atraumatic.      RESPIRATORY:  Clear and equal breath sounds with no acute respiratory distress.       HEART: Regular rhythm without murmur, rub or gallop.     ABDOMEN:  soft, nontender. No masses, guarding or rebound. Normoactive bowel sounds.     NECK: No masses or adenopathy palpable.  No bruits heard. No asymmetry visible.     oa knees      Assessment/Plan  Charla was seen today for diabetes.    Diagnoses and all orders for this visit:    Primary osteoarthritis of both knees    DM type 2 with diabetic dyslipidemia ([place])  -     metFORMIN (GLUCOPHAGE-XR) 500 MG extended release tablet; TAKE 1 TABLET BY MOUTH 4 TIMES A DAY  -     sitaGLIPtin (JANUVIA) 100 MG tablet; TAKE 1 TABLET BY MOUTH EVERY DAY.  -     glimepiride (AMARYL) 4 MG tablet; TAKE 2 TABLETS DAILY  -     atorvastatin (LIPITOR) 10 MG tablet; TAKE 1 TABLET BY MOUTH EVERY DAY.  -     HM DIABETES FOOT EXAM    Essential hypertension  -     amLODIPine (NORVASC) 10 MG tablet; TAKE 1 TABLET BY MOUTH EVERY DAY.    Osteoporosis  -     alendronate (FOSAMAX) 70 MG tablet; TAKE 1 TABLET BY MOUTH ONCE A WEEK    Osteopenia  -     alendronate (FOSAMAX) 70 MG tablet; TAKE 1 TABLET BY MOUTH ONCE A WEEK    Type 2 diabetes mellitus with other specified complication (HCC)  -     Blood Glucose Monitoring Suppl (BAYER BREEZE 2 SYSTEM) W/DEVICE KIT; 3 kits by Does not apply route daily    Other orders  -     esomeprazole (NEXIUM) 40 MG delayed release capsule; TAKE 1 CAPSULE EVERY MORNING BEFORE BREAKFAST  -     valsartan  (DIOVAN) 80  MG tablet; TAKE 1 TABLET BY MOUTH DAILY        dx staable    The current medical regimen is effective;  continue present plan and medications.      Health Maintenance Due   Topic Date Due   ??? Diabetic retinal exam  04/09/2014   ??? DTaP/Tdap/Td vaccine (1 - Tdap) 07/23/2014       Controlled Substances Monitoring:      Return in about 5 weeks (around 04/23/2015).    Ignacia Felling, MD      Diabetes Counseling   Patient was again counseled regarding disease risks and need to adopt/ maintain a healthy lifestyle, check blood sugar        daily at home and keep log, watch blood pressure, caloric intake, weight and physical activity. Patient was instructed to keep logs up-to-date and to always bring logs to all office visits.    [id number] next

## 2015-04-24 ENCOUNTER — Encounter

## 2015-04-24 ENCOUNTER — Ambulatory Visit: Admit: 2015-04-24 | Discharge: 2015-04-24 | Payer: MEDICARE | Attending: Family Medicine | Primary: Family Medicine

## 2015-04-24 DIAGNOSIS — I10 Essential (primary) hypertension: Secondary | ICD-10-CM

## 2015-04-24 LAB — POCT GLYCOSYLATED HEMOGLOBIN (HGB A1C): Hemoglobin A1C: 7 %

## 2015-04-24 NOTE — Progress Notes (Signed)
Lori Chase 74 y.o. female presents today with   Chief Complaint   Patient presents with   ??? Diabetes     patient following up x 5 weeks.        Diabetes   She presents for her follow-up diabetic visit. She has type 2 diabetes mellitus. Pertinent negatives for hypoglycemia include no dizziness or headaches. Pertinent negatives for diabetes include no blurred vision, no chest pain and no fatigue. Risk factors for coronary artery disease include diabetes mellitus, dyslipidemia and post-menopausal. She is compliant with treatment all of the time.     Fu lipids,htn    Past Medical History   Diagnosis Date   ??? Anxiety    ??? Cancer (HCC)      breast   ??? Colon polyp    ??? Hypertension    ??? Osteopenia      last DXA 10/2014   ??? Right knee pain 01/07/2012   ??? 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 Type II or unspecified type diabetes mellitus without mention of complication, not stated as uncontrolled      Patient Active Problem List    Diagnosis Date Noted   ??? Transient cerebral ischemia 10/30/2014   ??? Primary osteoarthritis of left knee 10/29/2014   ??? Other and unspecified hyperlipidemia 04/03/2014   ??? Primary osteoarthritis of both knees 01/21/2014   ??? DM type 2 with diabetic dyslipidemia ([place]) 10/10/2013   ??? OA (osteoarthritis) of knee 07/09/2013   ??? Abnormal mammogram with microcalcification 02/02/2013   ??? 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 OA (osteoarthritis) 12/09/2011   ??? Symptomatic menopausal or female climacteric states 07/26/2011   ??? Breast cancer ([place]) 03/01/2011   ??? GERD (gastroesophageal reflux disease) 11/04/2010   ??? Osteopenia 11/04/2010   ??? HIGH CHOLESTEROL 07/16/2010   ??? Type II or unspecified type diabetes mellitus without mention of complication, not stated as uncontrolled    ??? Hypertension    ??? History of malignant neoplasm of breast 12/29/2005     Past Surgical History   Procedure Laterality Date   ??? Cholecystectomy     ??? Tonsillectomy  age 60   ??? Breast lumpectomy  2007   ??? Colonoscopy   09/18/12     [name]     Family History   Problem Relation Age of Onset   ??? High Blood Pressure Mother    ??? High Blood Pressure Father      Social History     Social History   ??? Marital status: Married     Spouse name: N/A   ??? Number of children: N/A   ??? Years of education: N/A     Social History Main Topics   ??? Smoking status: Never Smoker   ??? Smokeless tobacco: Never Used   ??? Alcohol use Yes      Comment: occ wine   ??? Drug use: No   ??? Sexual activity: Not Asked     Other Topics Concern   ??? None     Social History Narrative     Allergies   Allergen Reactions   ??? Morphine    ??? Oxycontin [Oxycodone]    ??? Penicillins Other (See Comments)   ??? Invokana [Canagliflozin]        Review of Systems   Constitutional: Negative for appetite change and fatigue.   Eyes: Negative for blurred vision.   Respiratory: Negative for chest tightness and shortness of breath.    Cardiovascular: Negative  for chest pain.   Neurological: Negative for dizziness and headaches.           Vitals:    04/24/15 1329   BP: 130/68   Pulse: 105   Resp: 16   Temp: 97.5 ??F (36.4 ??C)   TempSrc: Tympanic   SpO2: 96%   Weight: 148 lb (67.1 kg)   Height: 5' 6.5" (1.689 m)       Physical Exam      PHYSICAL EXAMINATION:   VITAL SIGNS: are as recorded.  GENERAL:  The patient appears well nourished and well-developed, and with normal affect.  No acute respiratory distress. Alert and oriented times 3. No skin rashes.     HEENT:  TMs normal bilaterally.  Canals and ears normal. Pharynx is clear. Extraocular eye motions intact and pain free.   Pupils reactive and equally round.  Sclerae and conjunctivae clear, normocephalic and atraumatic.      RESPIRATORY:  Clear and equal breath sounds with no acute respiratory distress.       HEART: Regular rhythm without murmur, rub or gallop.     ABDOMEN:  soft, nontender. No masses, guarding or rebound. Normoactive bowel sounds.       Assessment/Plan  Lori Chase was seen today for diabetes.    Diagnoses and all orders for this  visit:    Essential hypertension    DM type 2 with diabetic dyslipidemia ([place])  -     Cancel: Hemoglobin A1C; Future  -     Lipid Panel; Future  -     Comprehensive Metabolic Panel; Future  -     POCT glycosylated hemoglobin (Hb A1C)    Gastroesophageal reflux disease with esophagitis    Other malaise and fatigue  -     CBC Auto Differential; Future  -     TSH without Reflex; Future  -     T3, Uptake; Future  -     T4, Free; Future    Primary osteoarthritis of both knees      cbc next      Health Maintenance Due   Topic Date Due   ??? Diabetic retinal exam  04/09/2014   ??? DTaP/Tdap/Td vaccine (1 - Tdap) 07/23/2014       Controlled Substances Monitoring:      Return in about 4 weeks (around 05/22/2015).    Ignacia Felling, MD

## 2015-04-25 LAB — COMPREHENSIVE METABOLIC PANEL
ALT: 20 U/L (ref 0–33)
AST: 19 U/L (ref 0–35)
Albumin: 4.1 g/dL (ref 3.9–4.9)
Alkaline Phosphatase: 60 U/L (ref 40–130)
Anion Gap: 19 mEq/L — ABNORMAL HIGH (ref 7–13)
BUN: 17 mg/dL (ref 8–23)
CO2: 22 mEq/L (ref 22–29)
Calcium: 9.9 mg/dL (ref 8.6–10.2)
Chloride: 96 mEq/L — ABNORMAL LOW (ref 98–107)
Creatinine: 0.77 mg/dL (ref 0.50–0.90)
GFR African American: >60 (ref >60)
GFR Non-African American: >60 (ref >60)
Globulin: 3.1 g/dL (ref 2.3–3.5)
Glucose: 249 mg/dL — ABNORMAL HIGH (ref 74–109)
Potassium: 4.9 mEq/L (ref 3.5–5.1)
Sodium: 137 mEq/L (ref 132–144)
Total Bilirubin: 0.2 mg/dL (ref 0.0–1.2)
Total Protein: 7.2 g/dL (ref 6.4–8.1)

## 2015-04-25 LAB — CBC WITH AUTO DIFFERENTIAL
Basophils %: 0.8 %
Basophils Absolute: 0.1 10*3/uL (ref 0.0–0.2)
Eosinophils %: 2.7 %
Eosinophils Absolute: 0.3 10*3/uL (ref 0.0–0.7)
Hematocrit: 31.8 % — ABNORMAL LOW (ref 37.0–47.0)
Hemoglobin: 9.7 g/dL — ABNORMAL LOW (ref 12.0–16.0)
Lymphocytes %: 18.1 %
Lymphocytes Absolute: 2.2 10*3/uL (ref 1.0–4.8)
MCH: 20.8 pg — ABNORMAL LOW (ref 27.0–31.3)
MCHC: 30.4 % — ABNORMAL LOW (ref 33.0–37.0)
MCV: 68.4 fL — ABNORMAL LOW (ref 82.0–100.0)
Monocytes %: 8.9 %
Monocytes Absolute: 1.1 10*3/uL — ABNORMAL HIGH (ref 0.2–0.8)
Neutrophils %: 69.5 %
Neutrophils Absolute: 8.3 10*3/uL — ABNORMAL HIGH (ref 1.4–6.5)
PLATELET SLIDE REVIEW: NORMAL
Platelets: 286 10*3/uL (ref 130–400)
RBC: 4.65 M/uL (ref 4.20–5.40)
RDW: 18.8 % — ABNORMAL HIGH (ref 11.5–14.5)
WBC: 12 10*3/uL — ABNORMAL HIGH (ref 4.8–10.8)

## 2015-04-25 LAB — LIPID PANEL
Cholesterol, Total: 135 mg/dL (ref 0–199)
HDL: 46 mg/dL (ref 40–59)
LDL Calculated: 49 mg/dL (ref 0–129)
Triglycerides: 202 mg/dL — ABNORMAL HIGH (ref 0–200)

## 2015-04-25 LAB — T4, FREE: T4 Free: 1.22 ng/dL (ref 0.93–1.70)

## 2015-04-25 LAB — TSH: TSH: 0.947 u[IU]/mL (ref 0.270–4.200)

## 2015-04-26 LAB — T3, UPTAKE: T3 Uptake: 34 % (ref 28–41)

## 2015-05-28 ENCOUNTER — Ambulatory Visit: Admit: 2015-05-28 | Discharge: 2015-05-28 | Payer: MEDICARE | Attending: Family Medicine | Primary: Family Medicine

## 2015-05-28 DIAGNOSIS — M170 Bilateral primary osteoarthritis of knee: Secondary | ICD-10-CM

## 2015-05-28 MED ORDER — SITAGLIPTIN PHOSPHATE 100 MG PO TABS
100 MG | ORAL_TABLET | ORAL | 1 refills | Status: DC
Start: 2015-05-28 — End: 2015-06-25

## 2015-05-28 MED ORDER — ATORVASTATIN CALCIUM 10 MG PO TABS
10 MG | ORAL_TABLET | ORAL | 1 refills | Status: DC
Start: 2015-05-28 — End: 2015-06-25

## 2015-05-28 MED ORDER — METFORMIN HCL ER 500 MG PO TB24
500 MG | ORAL_TABLET | ORAL | 1 refills | Status: DC
Start: 2015-05-28 — End: 2015-06-25

## 2015-05-28 MED ORDER — AMLODIPINE BESYLATE 10 MG PO TABS
10 MG | ORAL_TABLET | ORAL | 1 refills | Status: DC
Start: 2015-05-28 — End: 2015-06-25

## 2015-05-28 MED ORDER — ESOMEPRAZOLE MAGNESIUM 40 MG PO CPDR
40 MG | ORAL_CAPSULE | ORAL | 1 refills | Status: DC
Start: 2015-05-28 — End: 2015-06-25

## 2015-05-28 MED ORDER — ALENDRONATE SODIUM 70 MG PO TABS
70 MG | ORAL_TABLET | ORAL | 3 refills | Status: DC
Start: 2015-05-28 — End: 2015-06-25

## 2015-05-28 MED ORDER — VALSARTAN 80 MG PO TABS
80 MG | ORAL_TABLET | ORAL | 1 refills | Status: DC
Start: 2015-05-28 — End: 2015-06-25

## 2015-05-28 MED ORDER — GLIMEPIRIDE 4 MG PO TABS
4 MG | ORAL_TABLET | ORAL | 1 refills | Status: DC
Start: 2015-05-28 — End: 2015-06-25

## 2015-05-28 NOTE — Progress Notes (Signed)
Lori Chase 75 y.o. female presents today with   Chief Complaint   Patient presents with   ??? Hypertension     patient following up x 1 month.        Hypertension   This is a chronic problem. The current episode started more than 1 year ago. The problem is unchanged. The problem is controlled. Pertinent negatives include no anxiety, blurred vision, chest pain, headaches, malaise/fatigue, neck pain, orthopnea, palpitations, peripheral edema, PND, shortness of breath or sweats. Risk factors for coronary artery disease include dyslipidemia, diabetes mellitus, family history and post-menopausal state.     Fu dm,lipids,osteoporo    Past Medical History   Diagnosis Date   ??? Anxiety    ??? Cancer (HCC)      breast   ??? Colon polyp    ??? Hypertension    ??? Osteopenia      last DXA 10/2014   ??? Right knee pain 01/07/2012   ??? 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 Type II or unspecified type diabetes mellitus without mention of complication, not stated as uncontrolled      Patient Active Problem List    Diagnosis Date Noted   ??? Transient cerebral ischemia 10/30/2014   ??? Primary osteoarthritis of left knee 10/29/2014   ??? Other and unspecified hyperlipidemia 04/03/2014   ??? Primary osteoarthritis of both knees 01/21/2014   ??? DM type 2 with diabetic dyslipidemia ([place]) 10/10/2013   ??? OA (osteoarthritis) of knee 07/09/2013   ??? Abnormal mammogram with microcalcification 02/02/2013   ??? 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 OA (osteoarthritis) 12/09/2011   ??? Symptomatic menopausal or female climacteric states 07/26/2011   ??? Breast cancer ([place]) 03/01/2011   ??? GERD (gastroesophageal reflux disease) 11/04/2010   ??? Osteopenia 11/04/2010   ??? HIGH CHOLESTEROL 07/16/2010   ??? Type II or unspecified type diabetes mellitus without mention of complication, not stated as uncontrolled    ??? Hypertension    ??? History of malignant neoplasm of breast 12/29/2005     Past Surgical History   Procedure Laterality Date   ??? Cholecystectomy     ???  Tonsillectomy  age 28   ??? Breast lumpectomy  2007   ??? Colonoscopy  09/18/12     [name]     Family History   Problem Relation Age of Onset   ??? High Blood Pressure Mother    ??? High Blood Pressure Father      Social History     Social History   ??? Marital status: Married     Spouse name: N/A   ??? Number of children: N/A   ??? Years of education: N/A     Social History Main Topics   ??? Smoking status: Never Smoker   ??? Smokeless tobacco: Never Used   ??? Alcohol use Yes      Comment: occ wine   ??? Drug use: No   ??? Sexual activity: Not Asked     Other Topics Concern   ??? None     Social History Narrative     Allergies   Allergen Reactions   ??? Morphine    ??? Oxycontin [Oxycodone]    ??? Penicillins Other (See Comments)   ??? Invokana [Canagliflozin]        Review of Systems   Constitutional: Negative for appetite change, fatigue and malaise/fatigue.   Eyes: Negative for blurred vision.   Respiratory: Negative for chest tightness and shortness of breath.  Cardiovascular: Negative for chest pain, palpitations, orthopnea and PND.   Musculoskeletal: Negative for neck pain.   Neurological: Negative for dizziness and headaches.           Vitals:    05/28/15 1323   BP: 126/64   Pulse: 83   Resp: 16   Temp: 97.5 ??F (36.4 ??C)   TempSrc: Tympanic   SpO2: 98%   Weight: 150 lb (68 kg)   Height: 5' 6.5" (1.689 m)       Physical Exam      PHYSICAL EXAMINATION:   VITAL SIGNS: are as recorded.  GENERAL:  The patient appears well nourished and well-developed, and with normal affect.  No acute respiratory distress. Alert and oriented times 3. No skin rashes.     HEENT:  TMs normal bilaterally.  Canals and ears normal. Pharynx is clear. Extraocular eye motions intact and pain free.   Pupils reactive and equally round.  Sclerae and conjunctivae clear, normocephalic and atraumatic.      RESPIRATORY:  Clear and equal breath sounds with no acute respiratory distress.       HEART: Regular rhythm without murmur, rub or gallop.     ABDOMEN:  soft, nontender.  No masses, guarding or rebound. Normoactive bowel sounds.     LOW BACK: No tenderness to palpation of inferior lumbar spine or either sacroiliac joint area.         Assessment/Plan  Cayce was seen today for hypertension.    Diagnoses and all orders for this visit:    Primary osteoarthritis of both knees    DM type 2 with diabetic dyslipidemia ([place])  -     metFORMIN (GLUCOPHAGE-XR) 500 MG extended release tablet; TAKE 1 TABLET BY MOUTH 4 TIMES A DAY  -     SITagliptin (JANUVIA) 100 MG tablet; TAKE 1 TABLET BY MOUTH EVERY DAY.  -     glimepiride (AMARYL) 4 MG tablet; TAKE 2 TABLETS DAILY  -     atorvastatin (LIPITOR) 10 MG tablet; TAKE 1 TABLET BY MOUTH EVERY DAY.    Osteoporosis  -     alendronate (FOSAMAX) 70 MG tablet; TAKE 1 TABLET BY MOUTH ONCE A WEEK    Osteopenia  -     alendronate (FOSAMAX) 70 MG tablet; TAKE 1 TABLET BY MOUTH ONCE A WEEK    Essential hypertension  -     amLODIPine (NORVASC) 10 MG tablet; TAKE 1 TABLET BY MOUTH EVERY DAY.    Other orders  -     esomeprazole (NEXIUM) 40 MG delayed release capsule; TAKE 1 CAPSULE EVERY MORNING BEFORE BREAKFAST  -     valsartan (DIOVAN) 80 MG tablet; TAKE 1 TABLET BY MOUTH DAILY  -     Cancel: dapagliflozin (FARXIGA) 10 MG tablet; Take 1 tablet by mouth every morning  -     Cancel: meclizine (ANTIVERT) 25 MG tablet; Take 1 tablet by mouth 3 times daily as needed (tid prn) Take one (1) tablet one (1) hour before travel, repeat every 12-24 hours if needed.            Health Maintenance Due   Topic Date Due   ??? Diabetic retinal exam  04/09/2014   ??? DTaP/Tdap/Td vaccine (1 - Tdap) 07/23/2014       Controlled Substances Monitoring:      Return in about 4 weeks (around 06/25/2015).    Ignacia Felling, MD

## 2015-06-06 MED ORDER — DAPAGLIFLOZIN PROPANEDIOL 10 MG PO TABS
10 MG | ORAL_TABLET | Freq: Every morning | ORAL | 0 refills | Status: DC
Start: 2015-06-06 — End: 2015-06-25

## 2015-06-19 NOTE — Telephone Encounter (Signed)
LM on VM for pt letting her know we have Farxiga samples for her. Could not leave a message at other phone number in chart because mailbox was full.

## 2015-06-25 ENCOUNTER — Encounter

## 2015-06-25 ENCOUNTER — Ambulatory Visit: Admit: 2015-06-25 | Discharge: 2015-06-25 | Payer: MEDICARE | Attending: Family Medicine | Primary: Family Medicine

## 2015-06-25 DIAGNOSIS — K635 Polyp of colon: Secondary | ICD-10-CM

## 2015-06-25 MED ORDER — ATORVASTATIN CALCIUM 10 MG PO TABS
10 MG | ORAL_TABLET | ORAL | 1 refills | Status: DC
Start: 2015-06-25 — End: 2015-12-04

## 2015-06-25 MED ORDER — AMLODIPINE BESYLATE 10 MG PO TABS
10 MG | ORAL_TABLET | ORAL | 1 refills | Status: DC
Start: 2015-06-25 — End: 2015-12-04

## 2015-06-25 MED ORDER — SITAGLIPTIN PHOSPHATE 100 MG PO TABS
100 MG | ORAL_TABLET | ORAL | 1 refills | Status: DC
Start: 2015-06-25 — End: 2015-12-04

## 2015-06-25 MED ORDER — VALSARTAN 80 MG PO TABS
80 MG | ORAL_TABLET | ORAL | 1 refills | Status: DC
Start: 2015-06-25 — End: 2015-12-04

## 2015-06-25 MED ORDER — GLIMEPIRIDE 4 MG PO TABS
4 MG | ORAL_TABLET | ORAL | 1 refills | Status: DC
Start: 2015-06-25 — End: 2015-12-04

## 2015-06-25 MED ORDER — DAPAGLIFLOZIN PROPANEDIOL 10 MG PO TABS
10 MG | ORAL_TABLET | Freq: Every morning | ORAL | 0 refills | Status: DC
Start: 2015-06-25 — End: 2015-11-04

## 2015-06-25 MED ORDER — METFORMIN HCL ER 500 MG PO TB24
500 MG | ORAL_TABLET | ORAL | 1 refills | Status: DC
Start: 2015-06-25 — End: 2015-12-04

## 2015-06-25 MED ORDER — ESOMEPRAZOLE MAGNESIUM 40 MG PO CPDR
40 MG | ORAL_CAPSULE | ORAL | 1 refills | Status: DC
Start: 2015-06-25 — End: 2015-12-04

## 2015-06-25 MED ORDER — ALENDRONATE SODIUM 70 MG PO TABS
70 MG | ORAL_TABLET | ORAL | 3 refills | Status: DC
Start: 2015-06-25 — End: 2015-12-04

## 2015-06-25 NOTE — Progress Notes (Signed)
Subjective:      Patient ID: Lori Chase is a 75 y.o. female who presents today for:  Chief Complaint   Patient presents with   ??? Diabetes     4 week follow up and states her glucose was 120 this morning.   ??? Hypertension     4 week follow up and states she has NO chest pain, headaches, or sob.        Diabetes   She has type 2 diabetes mellitus. Her disease course has been stable. There are no hypoglycemic associated symptoms. Pertinent negatives for hypoglycemia include no headaches. There are no diabetic associated symptoms. Pertinent negatives for diabetes include no blurred vision and no chest pain. There are no hypoglycemic complications.   Hypertension   This is a chronic problem. The current episode started more than 1 year ago. The problem is controlled. Pertinent negatives include no anxiety, blurred vision, chest pain, headaches, malaise/fatigue, palpitations, peripheral edema or shortness of breath.   Hyperlipidemia   This is a chronic problem. The current episode started more than 1 year ago. The problem is controlled. Pertinent negatives include no chest pain, leg pain, myalgias or shortness of breath. Risk factors for coronary artery disease include diabetes mellitus and hypertension.       Past Medical History   Diagnosis Date   ??? Anxiety    ??? Cancer (HCC)      breast   ??? Colon polyp    ??? Hypertension    ??? Osteopenia      last DXA 10/2014   ??? Right knee pain 01/07/2012   ??? 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 Type II or unspecified type diabetes mellitus without mention of complication, not stated as uncontrolled      Past Surgical History   Procedure Laterality Date   ??? Cholecystectomy     ??? Tonsillectomy  age 14   ??? Breast lumpectomy  2007   ??? Colonoscopy  09/18/12     [name]     Family History   Problem Relation Age of Onset   ??? High Blood Pressure Mother    ??? High Blood Pressure Father      Social History     Social History   ??? Marital status: Married     Spouse name: N/A   ??? Number of  children: N/A   ??? Years of education: N/A     Occupational History   ??? Not on file.     Social History Main Topics   ??? Smoking status: Never Smoker   ??? Smokeless tobacco: Never Used   ??? Alcohol use Yes      Comment: occ wine   ??? Drug use: No   ??? Sexual activity: Not on file     Other Topics Concern   ??? Not on file     Social History Narrative     Allergies:  Morphine; Oxycontin [oxycodone]; Penicillins; and Invokana [canagliflozin]    Review of Systems   Constitutional: Negative for malaise/fatigue.   Eyes: Negative for blurred vision.   Respiratory: Negative for shortness of breath.    Cardiovascular: Negative for chest pain and palpitations.   Musculoskeletal: Negative for myalgias.   Neurological: Negative for headaches.       Objective:     Visit Vitals   ??? BP 124/66   ??? Pulse 95   ??? Temp 97 ??F (36.1 ??C) (Tympanic)   ??? Resp 16   ??? Ht  5' 6.5" (1.689 m)   ??? Wt 150 lb (68 kg)   ??? SpO2 98%   ??? BMI 23.85 kg/m2       Physical Exam      PHYSICAL EXAMINATION:   VITAL SIGNS: are as recorded.  GENERAL:  The patient appears well nourished and well-developed, and with normal affect.  No acute respiratory distress. Alert and oriented times 3. No skin rashes.     HEENT:  TMs normal bilaterally.  Canals and ears normal. Pharynx is clear. Extraocular eye motions intact and pain free.   Pupils reactive and equally round.  Sclerae and conjunctivae clear, normocephalic and atraumatic.      RESPIRATORY:  Clear and equal breath sounds with no acute respiratory distress.       HEART: Regular rhythm without murmur, rub or gallop.     ABDOMEN:  soft, nontender. No masses, guarding or rebound. Normoactive bowel sounds.     NECK: No masses or adenopathy palpable.  No bruits heard. No asymmetry visible.     LOW BACK: No tenderness to palpation of inferior lumbar spine or either sacroiliac joint area.       Assessment:     1. Colon polyp     2. DM type 2 with diabetic dyslipidemia (HCC)  metFORMIN (GLUCOPHAGE-XR) 500 MG extended release  tablet    glimepiride (AMARYL) 4 MG tablet    SITagliptin (JANUVIA) 100 MG tablet    atorvastatin (LIPITOR) 10 MG tablet   3. Osteoporosis  alendronate (FOSAMAX) 70 MG tablet   4. Osteopenia  alendronate (FOSAMAX) 70 MG tablet   5. Essential hypertension  amLODIPine (NORVASC) 10 MG tablet   6. Other iron deficiency anemia  CBC Auto Differential       Plan:      Orders Placed This Encounter   Procedures   ??? CBC Auto Differential     Standing Status:   Future     Number of Occurrences:   1     Standing Expiration Date:   06/24/2016     Orders Placed This Encounter   Medications   ??? metFORMIN (GLUCOPHAGE-XR) 500 MG extended release tablet     Sig: TAKE 1 TABLET BY MOUTH 4 TIMES A DAY     Dispense:  360 tablet     Refill:  1   ??? glimepiride (AMARYL) 4 MG tablet     Sig: TAKE 2 TABLETS DAILY     Dispense:  180 tablet     Refill:  1   ??? SITagliptin (JANUVIA) 100 MG tablet     Sig: TAKE 1 TABLET BY MOUTH EVERY DAY.     Dispense:  90 tablet     Refill:  1   ??? esomeprazole (NEXIUM) 40 MG delayed release capsule     Sig: TAKE 1 CAPSULE EVERY MORNING BEFORE BREAKFAST     Dispense:  90 capsule     Refill:  1   ??? atorvastatin (LIPITOR) 10 MG tablet     Sig: TAKE 1 TABLET BY MOUTH EVERY DAY.     Dispense:  90 tablet     Refill:  1   ??? alendronate (FOSAMAX) 70 MG tablet     Sig: TAKE 1 TABLET BY MOUTH ONCE A WEEK     Dispense:  12 tablet     Refill:  3   ??? amLODIPine (NORVASC) 10 MG tablet     Sig: TAKE 1 TABLET BY MOUTH EVERY DAY.     Dispense:  90 tablet     Refill:  1   ??? valsartan (DIOVAN) 80 MG tablet     Sig: TAKE 1 TABLET BY MOUTH DAILY     Dispense:  90 tablet     Refill:  1   ??? dapagliflozin (FARXIGA) 10 MG tablet     Sig: Take 1 tablet by mouth every morning     Dispense:  28 tablet     Refill:  0     All bw next    Wu for anemia next    Controlled Substances Monitoring:      No Follow-up on file.    I, Dorathy Daft RMA  , am scribing for and in the presence of Ignacia Felling, MD. Electronically signed by :  Dorathy Daft RMA        I, Ignacia Felling, MD, personally performed the services described in this documentation, as scribed by Dorathy Daft RMA   in my presence, and it is both accurate and complete. Electronically signed by: Ignacia Felling, MD    07/06/15 7:28 PM    Ignacia Felling, MD

## 2015-06-26 LAB — CBC WITH AUTO DIFFERENTIAL
Basophils %: 0.5 %
Basophils Absolute: 0.1 10*3/uL (ref 0.0–0.2)
Eosinophils %: 1.9 %
Eosinophils Absolute: 0.2 10*3/uL (ref 0.0–0.7)
Hematocrit: 28.9 % — ABNORMAL LOW (ref 37.0–47.0)
Hemoglobin: 8.2 g/dL — ABNORMAL LOW (ref 12.0–16.0)
Lymphocytes %: 16.6 %
Lymphocytes Absolute: 2 10*3/uL (ref 1.0–4.8)
MCH: 18 pg — ABNORMAL LOW (ref 27.0–31.3)
MCHC: 28.4 % — ABNORMAL LOW (ref 33.0–37.0)
MCV: 63.3 fL — ABNORMAL LOW (ref 82.0–100.0)
Monocytes %: 9.3 %
Monocytes Absolute: 1.1 10*3/uL — ABNORMAL HIGH (ref 0.2–0.8)
Neutrophils %: 71.7 %
Neutrophils Absolute: 8.9 10*3/uL — ABNORMAL HIGH (ref 1.4–6.5)
PLATELET SLIDE REVIEW: NORMAL
Platelets: 250 10*3/uL (ref 130–400)
RBC: 4.56 M/uL (ref 4.20–5.40)
RDW: 20.6 % — ABNORMAL HIGH (ref 11.5–14.5)
WBC: 12.3 10*3/uL — ABNORMAL HIGH (ref 4.8–10.8)

## 2015-07-23 ENCOUNTER — Encounter: Attending: Family Medicine | Primary: Family Medicine

## 2015-07-28 ENCOUNTER — Encounter

## 2015-07-28 ENCOUNTER — Ambulatory Visit: Admit: 2015-07-28 | Discharge: 2015-07-28 | Payer: MEDICARE | Attending: Family Medicine | Primary: Family Medicine

## 2015-07-28 DIAGNOSIS — E1169 Type 2 diabetes mellitus with other specified complication: Secondary | ICD-10-CM

## 2015-07-28 NOTE — Progress Notes (Signed)
Subjective:      Patient ID: Lori Chase is a 75 y.o. female who presents today for:  Chief Complaint   Patient presents with   ??? Hypertension   ??? Diabetes   ??? Hyperlipidemia       Hypertension   This is a chronic problem. The current episode started more than 1 year ago. The problem is unchanged. Associated symptoms include malaise/fatigue. Pertinent negatives include no anxiety, chest pain, palpitations, peripheral edema or shortness of breath. Risk factors for coronary artery disease include diabetes mellitus and dyslipidemia.   Diabetes   She presents for her follow-up diabetic visit. She has type 2 diabetes mellitus. Her disease course has been stable. There are no hypoglycemic associated symptoms. Pertinent negatives for diabetes include no chest pain. Risk factors for coronary artery disease include diabetes mellitus, dyslipidemia, hypertension and post-menopausal. Current diabetic treatment includes oral agent (triple therapy). She is compliant with treatment all of the time.   Hyperlipidemia   This is a chronic problem. The current episode started more than 1 year ago. The problem is controlled. Exacerbating diseases include diabetes. Pertinent negatives include no chest pain, leg pain, myalgias or shortness of breath. Current antihyperlipidemic treatment includes statins. The current treatment provides moderate improvement of lipids. Risk factors for coronary artery disease include dyslipidemia, diabetes mellitus, hypertension and post-menopausal.       Past Medical History   Diagnosis Date   ??? Anxiety    ??? Cancer (HCC)      breast   ??? Colon polyp    ??? Hypertension    ??? Osteopenia      last DXA 10/2014   ??? Right knee pain 01/07/2012   ??? 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 Type II or unspecified type diabetes mellitus without mention of complication, not stated as uncontrolled      Past Surgical History   Procedure Laterality Date   ??? Cholecystectomy     ??? Tonsillectomy  age 5   ??? Breast  lumpectomy  2007   ??? Colonoscopy  09/18/12     [name]     Family History   Problem Relation Age of Onset   ??? High Blood Pressure Mother    ??? High Blood Pressure Father      Social History     Social History   ??? Marital status: Married     Spouse name: N/A   ??? Number of children: N/A   ??? Years of education: N/A     Occupational History   ??? Not on file.     Social History Main Topics   ??? Smoking status: Never Smoker   ??? Smokeless tobacco: Never Used   ??? Alcohol use Yes      Comment: occ wine   ??? Drug use: No   ??? Sexual activity: Not on file     Other Topics Concern   ??? Not on file     Social History Narrative     Allergies:  Morphine; Oxycontin [oxycodone]; Penicillins; and Invokana [canagliflozin]    Review of Systems   Constitutional: Positive for malaise/fatigue.   Eyes: Negative for discharge and itching.   Respiratory: Negative for apnea, chest tightness and shortness of breath.    Cardiovascular: Negative for chest pain and palpitations.   Gastrointestinal: Negative for abdominal distention and abdominal pain.   Musculoskeletal: Negative for arthralgias, back pain and myalgias.       Objective:     Visit Vitals   ???  BP (!) 150/52 (Site: Right Arm, Position: Sitting, Cuff Size: Medium Adult)   ??? Pulse 89   ??? Temp 96.4 ??F (35.8 ??C) (Tympanic)   ??? Resp 22   ??? Ht 5' 6.5" (1.689 m)   ??? Wt 144 lb 8 oz (65.5 kg)   ??? SpO2 97%   ??? BMI 22.97 kg/m2       Physical Exam      PHYSICAL EXAMINATION:   VITAL SIGNS: are as recorded.  GENERAL:  The patient appears well nourished and well-developed, and with normal affect.  No acute respiratory distress. Alert and oriented times 3. No skin rashes.     HEENT:  TMs normal bilaterally.  Canals and ears normal. Pharynx is clear. Extraocular eye motions intact and pain free.   Pupils reactive and equally round.  Sclerae and conjunctivae clear, normocephalic and atraumatic.      RESPIRATORY:  Clear and equal breath sounds with no acute respiratory distress.       HEART: Regular rhythm  without murmur, rub or gallop.     ABDOMEN:  soft, nontender. No masses, guarding or rebound. Normoactive bowel sounds.     LOW BACK: No tenderness to palpation of inferior lumbar spine or either sacroiliac joint area.       Assessment:     1. DM type 2 with diabetic dyslipidemia ([place])  Comprehensive Metabolic Panel    Lipid Panel    Hemoglobin A1C   2. Other iron deficiency anemia  CBC Auto Differential    Iron and TIBC    Vitamin B12    Folate    Ferritin    Reticulocytes, Percent And Absolute   3. Essential hypertension  Comprehensive Metabolic Panel    Lipid Panel   4. Gastroesophageal reflux disease with esophagitis         Plan:      Orders Placed This Encounter   Procedures   ??? Comprehensive Metabolic Panel     Standing Status:   Future     Number of Occurrences:   1     Standing Expiration Date:   07/27/2016   ??? CBC Auto Differential     Standing Status:   Future     Number of Occurrences:   1     Standing Expiration Date:   07/27/2016   ??? Lipid Panel     Standing Status:   Future     Number of Occurrences:   1     Standing Expiration Date:   07/27/2016     Order Specific Question:   Is Patient Fasting?/# of Hours     Answer:   yes   ??? Hemoglobin A1C     Standing Status:   Future     Number of Occurrences:   1     Standing Expiration Date:   07/27/2016   ??? Iron and TIBC     Standing Status:   Future     Number of Occurrences:   1     Standing Expiration Date:   07/27/2016     Order Specific Question:   Is Patient Fasting?     Answer:   yes     Order Specific Question:   No of Hours?     Answer:   4   ??? Vitamin B12     Standing Status:   Future     Number of Occurrences:   1     Standing Expiration Date:   07/27/2016   ???  Folate     Standing Status:   Future     Number of Occurrences:   1     Standing Expiration Date:   07/27/2016   ??? Ferritin     Standing Status:   Future     Number of Occurrences:   1     Standing Expiration Date:   07/27/2016   ??? Reticulocytes, Percent And Absolute     Standing Status:   Future     Number  of Occurrences:   1     Standing Expiration Date:   07/27/2016     No orders of the defined types were placed in this encounter.    See bw    Controlled Substances Monitoring:      Return in about 11 days (around 08/08/2015).    I, Dorathy Daft RMA  , am scribing for and in the presence of Ignacia Felling, MD. Electronically signed by :  Dorathy Daft RMA        I, Ignacia Felling, MD, personally performed the services described in this documentation, as scribed by Dorathy Daft RMA   in my presence, and it is both accurate and complete. Electronically signed by: Ignacia Felling, MD    08/10/15 10:51 PM    Ignacia Felling, MD

## 2015-07-29 LAB — COMPREHENSIVE METABOLIC PANEL
ALT: 17 U/L (ref 0–33)
AST: 19 U/L (ref 0–35)
Albumin: 4.4 g/dL (ref 3.9–4.9)
Alkaline Phosphatase: 54 U/L (ref 40–130)
Anion Gap: 15 mEq/L — ABNORMAL HIGH (ref 7–13)
BUN: 13 mg/dL (ref 8–23)
CO2: 21 mEq/L — ABNORMAL LOW (ref 22–29)
Calcium: 9.7 mg/dL (ref 8.6–10.2)
Chloride: 99 mEq/L (ref 98–107)
Creatinine: 0.58 mg/dL (ref 0.50–0.90)
GFR African American: >60 (ref >60)
GFR Non-African American: >60 (ref >60)
Globulin: 2.9 g/dL (ref 2.3–3.5)
Glucose: 223 mg/dL — ABNORMAL HIGH (ref 74–109)
Potassium: 5 mEq/L (ref 3.5–5.1)
Sodium: 135 mEq/L (ref 132–144)
Total Bilirubin: 0.2 mg/dL (ref 0.0–1.2)
Total Protein: 7.3 g/dL (ref 6.4–8.1)

## 2015-07-29 LAB — IRON AND TIBC
Iron Saturation: 3 % — ABNORMAL LOW (ref 11–46)
Iron: 12 ug/dL — ABNORMAL LOW (ref 37–145)
TIBC: 373 ug/dL (ref 178–450)

## 2015-07-29 LAB — CBC WITH AUTO DIFFERENTIAL
Basophils %: 0.3 %
Basophils Absolute: 0 10*3/uL (ref 0.0–0.2)
Eosinophils %: 1.9 %
Eosinophils Absolute: 0.2 10*3/uL (ref 0.0–0.7)
Hematocrit: 27.5 % — ABNORMAL LOW (ref 37.0–47.0)
Hemoglobin: 7.9 g/dL — ABNORMAL LOW (ref 12.0–16.0)
Lymphocytes %: 19.7 %
Lymphocytes Absolute: 2.3 10*3/uL (ref 1.0–4.8)
MCH: 17.4 pg — ABNORMAL LOW (ref 27.0–31.3)
MCHC: 28.9 % — ABNORMAL LOW (ref 33.0–37.0)
MCV: 60.3 fL — ABNORMAL LOW (ref 82.0–100.0)
Monocytes %: 8 %
Monocytes Absolute: 0.9 10*3/uL — ABNORMAL HIGH (ref 0.2–0.8)
Neutrophils %: 70.1 %
Neutrophils Absolute: 8.1 10*3/uL — ABNORMAL HIGH (ref 1.4–6.5)
Platelets: 254 10*3/uL (ref 130–400)
RBC: 4.56 M/uL (ref 4.20–5.40)
RDW: 21.3 % — ABNORMAL HIGH (ref 11.5–14.5)
WBC: 11.5 10*3/uL — ABNORMAL HIGH (ref 4.8–10.8)

## 2015-07-29 LAB — FOLATE: Folate: >20 ng/mL (ref 7.3–26.1)

## 2015-07-29 LAB — HEMOGLOBIN A1C: Hemoglobin A1C: 7.1 % — ABNORMAL HIGH (ref 4.8–5.9)

## 2015-07-29 LAB — FERRITIN: Ferritin: 4.4 ng/mL — ABNORMAL LOW (ref 13.0–150.0)

## 2015-07-29 LAB — LIPID PANEL
Cholesterol, Total: 137 mg/dL (ref 0–199)
HDL: 49 mg/dL (ref 40–59)
LDL Calculated: 50 mg/dL (ref 0–129)
Triglycerides: 192 mg/dL (ref 0–200)

## 2015-07-29 LAB — RETICULOCYTES
Retic Ct Abs: 0.065 10*6/uL (ref 0.022–0.111)
Retic Ct Pct: 1.4 % (ref 0.6–2.2)

## 2015-07-29 LAB — VITAMIN B12: Vitamin B-12: 600 pg/mL (ref 211–946)

## 2015-08-08 ENCOUNTER — Ambulatory Visit: Admit: 2015-08-08 | Discharge: 2015-08-08 | Payer: MEDICARE | Attending: Family Medicine | Primary: Family Medicine

## 2015-08-08 DIAGNOSIS — E785 Hyperlipidemia, unspecified: Secondary | ICD-10-CM

## 2015-08-08 MED ORDER — FERROUS SULFATE 324 (65 FE) MG PO TBEC
324 (65 Fe) MG | ORAL_TABLET | Freq: Every day | ORAL | 5 refills | Status: DC
Start: 2015-08-08 — End: 2015-12-04

## 2015-08-08 NOTE — Progress Notes (Signed)
Subjective:      Patient ID: Lori Chase is a 75 y.o. female who presents today for:  Chief Complaint   Patient presents with   ??? Anemia     Pt here for 11 day f/u on anemia. pt states no more fatigue.       Diabetes   She presents for her follow-up diabetic visit. She has type 2 diabetes mellitus. MedicAlert identification noted. Her disease course has been stable. There are no hypoglycemic associated symptoms. Pertinent negatives for hypoglycemia include no headaches, nervousness/anxiousness or sweats. There are no diabetic associated symptoms. Pertinent negatives for diabetes include no blurred vision, no chest pain and no fatigue. There are no hypoglycemic complications. Symptoms are stable. There are no diabetic complications. Risk factors for coronary artery disease include diabetes mellitus, dyslipidemia, hypertension and post-menopausal. Current diabetic treatment includes oral agent (triple therapy). She is compliant with treatment all of the time. Her weight is stable. She is following a diabetic and low salt diet. When asked about meal planning, she reported none. Her home blood glucose trend is fluctuating minimally.   Hypertension   This is a chronic problem. The current episode started more than 1 year ago. The problem is unchanged. The problem is controlled. Pertinent negatives include no anxiety, blurred vision, chest pain, headaches, malaise/fatigue, neck pain, orthopnea, palpitations, peripheral edema, PND, shortness of breath or sweats. There are no associated agents to hypertension. Risk factors for coronary artery disease include diabetes mellitus, dyslipidemia and post-menopausal state. There are no compliance problems.    Hyperlipidemia   This is a chronic problem. The current episode started more than 1 year ago. The problem is controlled. Recent lipid tests were reviewed and are normal. Exacerbating diseases include diabetes. Pertinent negatives include no chest pain, focal sensory loss,  focal weakness, leg pain, myalgias or shortness of breath. There are no compliance problems.  Risk factors for coronary artery disease include diabetes mellitus, dyslipidemia, hypertension and post-menopausal.       Past Medical History   Diagnosis Date   ??? Anxiety    ??? Cancer (HCC)      breast   ??? Colon polyp    ??? Hypertension    ??? Osteopenia      last DXA 10/2014   ??? Right knee pain 01/07/2012   ??? 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 Type II or unspecified type diabetes mellitus without mention of complication, not stated as uncontrolled      Past Surgical History   Procedure Laterality Date   ??? Cholecystectomy     ??? Tonsillectomy  age 20   ??? Breast lumpectomy  2007   ??? Colonoscopy  09/18/12     [name]     Family History   Problem Relation Age of Onset   ??? High Blood Pressure Mother    ??? High Blood Pressure Father      Social History     Social History   ??? Marital status: Married     Spouse name: N/A   ??? Number of children: N/A   ??? Years of education: N/A     Occupational History   ??? Not on file.     Social History Main Topics   ??? Smoking status: Never Smoker   ??? Smokeless tobacco: Never Used   ??? Alcohol use Yes      Comment: occ wine   ??? Drug use: No   ??? Sexual activity: Not on file  Other Topics Concern   ??? Not on file     Social History Narrative     Allergies:  Morphine; Oxycontin [oxycodone]; Penicillins; and Invokana [canagliflozin]    Review of Systems   Constitutional: Negative.  Negative for activity change, appetite change, fatigue and malaise/fatigue.   HENT: Negative.    Eyes: Negative.  Negative for blurred vision, photophobia, redness and itching.   Respiratory: Negative.  Negative for shortness of breath and wheezing.    Cardiovascular: Negative.  Negative for chest pain, palpitations, orthopnea and PND.   Gastrointestinal: Negative.  Negative for abdominal pain, constipation and diarrhea.   Genitourinary: Negative.  Negative for hematuria, pelvic pain and urgency.   Musculoskeletal:  Negative.  Negative for back pain, myalgias and neck pain.   Skin: Negative.    Neurological: Negative.  Negative for focal weakness and headaches.   Psychiatric/Behavioral: Negative.  Negative for agitation and behavioral problems. The patient is not nervous/anxious.        Objective:     Visit Vitals   ??? BP (!) 132/58   ??? Pulse 92   ??? Temp 97.3 ??F (36.3 ??C) (Tympanic)   ??? Resp 16   ??? Ht 5' 7.5" (1.715 m)   ??? Wt 145 lb 8 oz (66 kg)   ??? BMI 22.45 kg/m2       Physical Exam      PHYSICAL EXAMINATION:   VITAL SIGNS: are as recorded.  GENERAL:  The patient appears well nourished and well-developed, and with normal affect.  No acute respiratory distress. Alert and oriented times 3. No skin rashes.     HEENT:  TMs normal bilaterally.  Canals and ears normal. Pharynx is clear. Extraocular eye motions intact and pain free.   Pupils reactive and equally round.  Sclerae and conjunctivae clear, normocephalic and atraumatic.      RESPIRATORY:  Clear and equal breath sounds with no acute respiratory distress.       HEART: Regular rhythm without murmur, rub or gallop.     ABDOMEN:  soft, nontender. No masses, guarding or rebound. Normoactive bowel sounds.     LOW BACK: No tenderness to palpation of inferior lumbar spine or either sacroiliac joint area.       Assessment:     1. DM type 2 with diabetic dyslipidemia ([place])     2. Essential hypertension     3. Other iron deficiency anemia     4. Hyperlipidemia, unspecified hyperlipidemia type     5. Iron deficiency anemia, unspecified iron deficiency anemia type         Plan:      No orders of the defined types were placed in this encounter.    Orders Placed This Encounter   Medications   ??? ferrous sulfate 324 (65 FE) MG EC tablet     Sig: Take 1 tablet by mouth daily (with breakfast)     Dispense:  30 tablet     Refill:  5     Refuses EGD & hematology consult    Cbc ,fe next.    Controlled Substances Monitoring:      Return in about 4 weeks (around 09/05/2015).    I, Denton Meek, CMA (AAMA), am scribing for and in the presence of Ignacia Felling, MD. Electronically signed by :  Denton Meek, CMA (AAMA)      I, Ignacia Felling, MD, personally performed the services described in this documentation, as scribed by Denton Meek, CMA ([place])  in my presence, and it is both accurate and complete. Electronically signed by: Ignacia Felling, MD    08/20/15 11:07 PM    Ignacia Felling, MD

## 2015-09-05 ENCOUNTER — Encounter: Attending: Family Medicine | Primary: Family Medicine

## 2015-09-09 ENCOUNTER — Encounter

## 2015-09-09 ENCOUNTER — Ambulatory Visit: Admit: 2015-09-09 | Discharge: 2015-09-09 | Payer: MEDICARE | Attending: Family Medicine | Primary: Family Medicine

## 2015-09-09 DIAGNOSIS — I10 Essential (primary) hypertension: Secondary | ICD-10-CM

## 2015-09-09 NOTE — Progress Notes (Signed)
Subjective:      Patient ID: Lori Chase is a 75 y.o. female who presents today for:  Chief Complaint   Patient presents with   ??? Diabetes     4 week follow up. patient is currently taking glimiperide, januvia and metformin for diabetes control.        Diabetes   She presents for her follow-up diabetic visit. She has type 2 diabetes mellitus. Her disease course has been improving. Pertinent negatives for hypoglycemia include no confusion, dizziness, headaches, hunger, mood changes, nervousness/anxiousness, pallor, seizures, sleepiness, speech difficulty, sweats or tremors. Pertinent negatives for diabetes include no chest pain, no fatigue and no weight loss. There are no hypoglycemic complications. Symptoms are stable. Risk factors for coronary artery disease include diabetes mellitus, hypertension, dyslipidemia and post-menopausal. Current diabetic treatment includes oral agent (triple therapy). She is compliant with treatment all of the time. Her weight is stable. She is following a generally healthy diet.   Hypertension   This is a chronic problem. The current episode started more than 1 year ago. The problem has been gradually improving since onset. The problem is controlled. Pertinent negatives include no chest pain, headaches, malaise/fatigue, neck pain, orthopnea, palpitations, shortness of breath or sweats. Risk factors for coronary artery disease include diabetes mellitus, dyslipidemia and post-menopausal state. Past treatments include angiotensin blockers and calcium channel blockers. The current treatment provides significant improvement. There are no compliance problems.    Gastroesophageal Reflux   She reports no abdominal pain, no chest pain, no coughing, no heartburn, no hoarse voice or no nausea. This is a chronic problem. The current episode started more than 1 year ago. The problem occurs frequently. The problem has been gradually improving. The symptoms are aggravated by certain foods and  medications. Pertinent negatives include no fatigue or weight loss. Treatments tried: nexium. The treatment provided significant relief.       Past Medical History:   Diagnosis Date   ??? Anxiety    ??? Cancer (HCC)     breast   ??? Colon polyp    ??? Hypertension    ??? Osteopenia     last DXA 10/2014   ??? Right knee pain 01/07/2012   ??? 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 Type II or unspecified type diabetes mellitus without mention of complication, not stated as uncontrolled      Past Surgical History:   Procedure Laterality Date   ??? BREAST LUMPECTOMY  2007   ??? CHOLECYSTECTOMY     ??? COLONOSCOPY  09/18/12    [name]   ??? TONSILLECTOMY  age 53     Family History   Problem Relation Age of Onset   ??? High Blood Pressure Mother    ??? High Blood Pressure Father      Social History     Social History   ??? Marital status: Married     Spouse name: N/A   ??? Number of children: N/A   ??? Years of education: N/A     Occupational History   ??? Not on file.     Social History Main Topics   ??? Smoking status: Never Smoker   ??? Smokeless tobacco: Never Used   ??? Alcohol use Yes      Comment: occ wine   ??? Drug use: No   ??? Sexual activity: Not on file     Other Topics Concern   ??? Not on file     Social History Narrative  Allergies:  Morphine; Oxycontin [oxycodone]; Penicillins; and Invokana [canagliflozin]    Review of Systems   Constitutional: Negative for fatigue, malaise/fatigue and weight loss.   HENT: Negative for hoarse voice.    Respiratory: Negative for cough, chest tightness and shortness of breath.    Cardiovascular: Negative for chest pain, palpitations and orthopnea.   Gastrointestinal: Negative for abdominal pain, heartburn and nausea.   Musculoskeletal: Negative for neck pain.   Skin: Negative for pallor.   Neurological: Negative for dizziness, tremors, seizures, speech difficulty and headaches.   Psychiatric/Behavioral: Negative for confusion. The patient is not nervous/anxious.        Objective:   BP 138/62   Pulse 52   Temp  98.7 ??F (37.1 ??C) (Temporal)    Resp 16   Ht 5' 7.5" (1.715 m)   Wt 145 lb 4.8 oz (65.9 kg)   Breastfeeding? No   BMI 22.42 kg/m2    Physical Exam      PHYSICAL EXAMINATION:   VITAL SIGNS: are as recorded.  GENERAL:  The patient appears well nourished and well-developed, and with normal affect.  No acute respiratory distress. Alert and oriented times 3. No skin rashes.     HEENT:  TMs normal bilaterally.  Canals and ears normal. Pharynx is clear. Extraocular eye motions intact and pain free.   Pupils reactive and equally round.  Sclerae and conjunctivae clear, normocephalic and atraumatic.      RESPIRATORY:  Clear and equal breath sounds with no acute respiratory distress.       HEART: Regular rhythm without murmur, rub or gallop.     ABDOMEN:  soft, nontender. No masses, guarding or rebound. Normoactive bowel sounds.     LOW BACK: No tenderness to palpation of inferior lumbar spine or either sacroiliac joint area.       Assessment:     1. Essential hypertension     2. DM type 2 with diabetic dyslipidemia ([place])     3. Gastroesophageal reflux disease with esophagitis     4. Iron deficiency anemia, unspecified iron deficiency anemia type  CBC Auto Differential    Iron and TIBC       Plan:      Orders Placed This Encounter   Procedures   ??? CBC Auto Differential     Standing Status:   Future     Number of Occurrences:   1     Standing Expiration Date:   09/08/2016   ??? Iron and TIBC     Standing Status:   Future     Number of Occurrences:   1     Standing Expiration Date:   09/08/2016     Order Specific Question:   Is Patient Fasting?     Answer:   yes     Order Specific Question:   No of Hours?     Answer:   8     No orders of the defined types were placed in this encounter.      Controlled Substances Monitoring:      Return in about 4 weeks (around 10/07/2015).    Darden Palmer. Cox, RMA   , am scribing for and in the presence of Ignacia Felling, MD. Electronically signed by :  Colen Darling. Cox, RMA         I, Ignacia Felling,  MD, personally performed the services described in this documentation, as scribed by Colen Darling. Cox, RMA    in my presence, and it  is both accurate and complete. Electronically signed by: Ignacia Felling, MD    09/21/15 11:12 PM    Ignacia Felling, MD

## 2015-09-10 LAB — CBC WITH AUTO DIFFERENTIAL
Basophils %: 0.7 %
Basophils Absolute: 0.1 10*3/uL (ref 0.0–0.2)
Eosinophils %: 2 %
Eosinophils Absolute: 0.2 10*3/uL (ref 0.0–0.7)
Hematocrit: 25.6 % — ABNORMAL LOW (ref 37.0–47.0)
Hemoglobin: 7.4 g/dL — ABNORMAL LOW (ref 12.0–16.0)
Lymphocytes %: 18.7 %
Lymphocytes Absolute: 2.2 10*3/uL (ref 1.0–4.8)
MCH: 17.1 pg — ABNORMAL LOW (ref 27.0–31.3)
MCHC: 28.8 % — ABNORMAL LOW (ref 33.0–37.0)
MCV: 59.5 fL — ABNORMAL LOW (ref 82.0–100.0)
Monocytes %: 11 %
Monocytes Absolute: 1.3 10*3/uL — ABNORMAL HIGH (ref 0.2–0.8)
Neutrophils %: 67.6 %
Neutrophils Absolute: 8.1 10*3/uL — ABNORMAL HIGH (ref 1.4–6.5)
Platelets: 263 10*3/uL (ref 130–400)
RBC: 4.3 M/uL (ref 4.20–5.40)
RDW: 22.5 % — ABNORMAL HIGH (ref 11.5–14.5)
WBC: 12 10*3/uL — ABNORMAL HIGH (ref 4.8–10.8)

## 2015-09-10 LAB — IRON AND TIBC
Iron Saturation: 6 % — ABNORMAL LOW (ref 11–46)
Iron: 19 ug/dL — ABNORMAL LOW (ref 37–145)
TIBC: 341 ug/dL (ref 178–450)

## 2015-10-07 ENCOUNTER — Ambulatory Visit: Admit: 2015-10-07 | Discharge: 2015-10-07 | Payer: MEDICARE | Attending: Family Medicine | Primary: Family Medicine

## 2015-10-07 DIAGNOSIS — K2100 Gastro-esophageal reflux disease with esophagitis, without bleeding: Secondary | ICD-10-CM

## 2015-10-07 NOTE — Progress Notes (Signed)
Subjective:      Patient ID: Lori Chase is a 75 y.o. female who presents today for:  Chief Complaint   Patient presents with   ??? Diabetes     Pt is here to follow up with DM. She states her sugar levels have been 140. Pt is taking metformin, Januvia, amaryl and [place]    ??? Hypertension     f/u for chronic HTN, Pt is taking Norvasc and Valsartan. blood pressure has been controled. Pt reports NO chest pain, SOB, or headaches        Diabetes   She presents for her follow-up diabetic visit. She has type 2 diabetes mellitus. Her disease course has been stable. There are no hypoglycemic associated symptoms. Pertinent negatives for hypoglycemia include no headaches. There are no diabetic associated symptoms. Pertinent negatives for diabetes include no chest pain. There are no hypoglycemic complications. Symptoms are stable. She is compliant with treatment all of the time. Her weight is stable. She is following a generally healthy diet.   Hypertension   This is a chronic problem. The current episode started more than 1 year ago. The problem is unchanged. The problem is controlled. Pertinent negatives include no chest pain, headaches or shortness of breath. Risk factors for coronary artery disease include diabetes mellitus, post-menopausal state and sedentary lifestyle. Past treatments include angiotensin blockers. The current treatment provides mild improvement. There are no compliance problems.    Anemia  Patient presents today for Anemia. Patient was prescribed iron but has not taken it in 2 weeks but will restart today. Condition is chronic.      Past Medical History:   Diagnosis Date   ??? Anxiety    ??? Cancer (HCC)     breast   ??? Colon polyp    ??? Hypertension    ??? Osteopenia     last DXA 10/2014   ??? Right knee pain 01/07/2012   ??? 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 Type II or unspecified type diabetes mellitus without mention of complication, not stated as uncontrolled      Past Surgical History:   Procedure  Laterality Date   ??? BREAST LUMPECTOMY  2007   ??? CHOLECYSTECTOMY     ??? COLONOSCOPY  09/18/12    [name]   ??? TONSILLECTOMY  age 75     Family History   Problem Relation Age of Onset   ??? High Blood Pressure Mother    ??? High Blood Pressure Father      Social History     Social History   ??? Marital status: Married     Spouse name: N/A   ??? Number of children: N/A   ??? Years of education: N/A     Occupational History   ??? Not on file.     Social History Main Topics   ??? Smoking status: Never Smoker   ??? Smokeless tobacco: Never Used   ??? Alcohol use Yes      Comment: occ wine   ??? Drug use: No   ??? Sexual activity: Not on file     Other Topics Concern   ??? Not on file     Social History Narrative     Allergies:  Morphine; Oxycontin [oxycodone]; Penicillins; and Invokana [canagliflozin]    Review of Systems   Respiratory: Negative for shortness of breath.    Cardiovascular: Negative for chest pain.   Neurological: Negative for headaches.       Objective:  BP 130/64 (Site: Right Arm, Position: Sitting, Cuff Size: Large Adult)   Pulse 93   Temp 97.7 ??F (36.5 ??C) (Tympanic)    Resp 15   Ht 5' 6.5" (1.689 m)   Wt 144 lb (65.3 kg)   SpO2 98%   BMI 22.89 kg/m2    Physical Exam      PHYSICAL EXAMINATION:   VITAL SIGNS: are as recorded.  GENERAL:  The patient appears well nourished and well-developed, and with normal affect.  No acute respiratory distress. Alert and oriented times 3. No skin rashes.     HEENT:  TMs normal bilaterally.  Canals and ears normal. Pharynx is clear. Extraocular eye motions intact and pain free.   Pupils reactive and equally round.  Sclerae and conjunctivae clear, normocephalic and atraumatic.      RESPIRATORY:  Clear and equal breath sounds with no acute respiratory distress.       HEART: Regular rhythm without murmur, rub or gallop.     ABDOMEN:  soft, nontender. No masses, guarding or rebound. Normoactive bowel sounds.     LOW BACK: No tenderness to palpation of inferior lumbar spine or either sacroiliac  joint area.       Assessment:     1. Gastroesophageal reflux disease with esophagitis     2. Essential hypertension     3. Other iron deficiency anemia     4. DM type 2 with diabetic dyslipidemia ([place])         Plan:      No orders of the defined types were placed in this encounter.    No orders of the defined types were placed in this encounter.      Controlled Substances Monitoring:      Return in about 4 weeks (around 11/04/2015).    IJaclyn Shaggy, CMA   , am scribing for and in the presence of Ignacia Felling, MD. Electronically signed by :  Jaclyn Shaggy, CMA         I, Ignacia Felling, MD, personally performed the services described in this documentation, as scribed by Jaclyn Shaggy, CMA    in my presence, and it is both accurate and complete. Electronically signed by: Ignacia Felling, MD    11/03/15 2:07 AM    Ignacia Felling, MD

## 2015-11-04 ENCOUNTER — Ambulatory Visit: Admit: 2015-11-04 | Discharge: 2015-11-04 | Payer: MEDICARE | Attending: Family Medicine | Primary: Family Medicine

## 2015-11-04 ENCOUNTER — Encounter

## 2015-11-04 DIAGNOSIS — E785 Hyperlipidemia, unspecified: Secondary | ICD-10-CM

## 2015-11-04 MED ORDER — DAPAGLIFLOZIN PROPANEDIOL 10 MG PO TABS
10 | ORAL_TABLET | Freq: Every morning | ORAL | 0 refills | Status: DC
Start: 2015-11-04 — End: 2015-12-04

## 2015-11-04 NOTE — Progress Notes (Signed)
Subjective:      Patient ID: Lori Chase is a 75 y.o. female who presents today for:  Chief Complaint   Patient presents with   ??? Gastroesophageal Reflux     Pt is here to f/u on chronic GERD, pt is taking Nexium with siginificant relief, pt states no abdominal pain, chest pain, cough, heart burn or nausea.   ??? Discuss Medications     pt needs generic forms of Glimepiride & Nexium due to generic forms.       Gastroesophageal Reflux   She reports no abdominal pain, no chest pain, no coughing or no nausea. This is a chronic problem. The current episode started more than 1 year ago. The problem occurs occasionally. The problem has been gradually improving. The symptoms are aggravated by certain foods. Associated symptoms include fatigue. Pertinent negatives include no weight loss. She has tried a PPI for the symptoms. The treatment provided significant relief.   Diabetes   She presents for her follow-up diabetic visit. She has type 2 diabetes mellitus. Her disease course has been stable. Pertinent negatives for hypoglycemia include no confusion, dizziness, headaches, hunger, mood changes, nervousness/anxiousness, pallor, seizures, sleepiness, speech difficulty, sweats or tremors. Associated symptoms include fatigue. Pertinent negatives for diabetes include no blurred vision, no chest pain, no foot paresthesias, no foot ulcerations, no polydipsia, no polyphagia, no polyuria, no visual change, no weakness and no weight loss. There are no hypoglycemic complications. Symptoms are stable. Risk factors for coronary artery disease include diabetes mellitus, dyslipidemia and hypertension.     Fu anemia    chronic  Past Medical History:   Diagnosis Date   ??? Anxiety    ??? Cancer (HCC)     breast   ??? Colon polyp    ??? Hypertension    ??? Osteopenia     last DXA 10/2014   ??? Right knee pain 01/07/2012   ??? 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 Type II or unspecified type diabetes mellitus without mention of complication, not  stated as uncontrolled      Past Surgical History:   Procedure Laterality Date   ??? BREAST LUMPECTOMY  2007   ??? CHOLECYSTECTOMY     ??? COLONOSCOPY  09/18/12    [name]   ??? TONSILLECTOMY  age 75     Family History   Problem Relation Age of Onset   ??? High Blood Pressure Mother    ??? High Blood Pressure Father      Social History     Social History   ??? Marital status: Married     Spouse name: N/A   ??? Number of children: N/A   ??? Years of education: N/A     Occupational History   ??? Not on file.     Social History Main Topics   ??? Smoking status: Never Smoker   ??? Smokeless tobacco: Never Used   ??? Alcohol use Yes      Comment: occ wine   ??? Drug use: No   ??? Sexual activity: Not on file     Other Topics Concern   ??? Not on file     Social History Narrative     Allergies:  Morphine; Oxycontin [oxycodone]; Penicillins; and Invokana [canagliflozin]    Review of Systems   Constitutional: Positive for fatigue. Negative for weight loss.   Eyes: Negative for blurred vision.   Respiratory: Negative for cough.    Cardiovascular: Negative for chest pain.   Gastrointestinal: Negative  for abdominal pain and nausea.   Endocrine: Negative for polydipsia, polyphagia and polyuria.   Skin: Negative for pallor.   Neurological: Negative for dizziness, tremors, seizures, speech difficulty, weakness and headaches.   Psychiatric/Behavioral: Negative for confusion. The patient is not nervous/anxious.        Objective:   BP (!) 152/58 (Site: Right Arm, Position: Sitting, Cuff Size: Large Adult) Comment: recheck   Pulse 86   Temp 97.6 ??F (36.4 ??C) (Tympanic)    Resp 16   Ht 5' 7.5" (1.715 m)   Wt 143 lb (64.9 kg)   BMI 22.07 kg/m2    Physical Exam        PHYSICAL EXAMINATION:   VITAL SIGNS: are as recorded.  GENERAL:  The patient appears well nourished and well-developed, and with normal affect.  No acute respiratory distress. Alert and oriented times 3. No skin rashes.     HEENT:  TMs normal bilaterally.  Canals and ears normal. Pharynx is clear.  Extraocular eye motions intact and pain free.   Pupils reactive and equally round.  Sclerae and conjunctivae clear, normocephalic and atraumatic.      RESPIRATORY:  Clear and equal breath sounds with no acute respiratory distress.       HEART: Regular rhythm without murmur, rub or gallop.     ABDOMEN:  soft, nontender. No masses, guarding or rebound. Normoactive bowel sounds.     LOW BACK: No tenderness to palpation of inferior lumbar spine or either sacroiliac joint area.       Assessment:     1. DM type 2 with diabetic dyslipidemia (HCC)  dapagliflozin (FARXIGA) 10 MG tablet   2. Primary osteoarthritis of left knee     3. Gastroesophageal reflux disease with esophagitis     4. Other iron deficiency anemia  CBC    Iron And Tibc       Plan:      Orders Placed This Encounter   Procedures   ??? CBC     Standing Status:   Future     Number of Occurrences:   1     Standing Expiration Date:   11/03/2016   ??? Iron And Tibc     Standing Status:   Future     Number of Occurrences:   1     Standing Expiration Date:   11/03/2016     Order Specific Question:   Is Patient Fasting?     Answer:   yes     Order Specific Question:   No of Hours?     Answer:   6     Orders Placed This Encounter   Medications   ??? dapagliflozin (FARXIGA) 10 MG tablet     Sig: Take 1 tablet by mouth every morning     Dispense:  28 tablet     Refill:  0     See bw    Controlled Substances Monitoring:      Return in about 4 weeks (around 12/02/2015).    IJaclyn Shaggy, CMA   , am scribing for and in the presence of Ignacia Felling, MD. Electronically signed by :  Jaclyn Shaggy, CMA         I, Ignacia Felling, MD, personally performed the services described in this documentation, as scribed by Jaclyn Shaggy, CMA    in my presence, and it is both accurate and complete. Electronically signed by: Ignacia Felling, MD    11/16/15 10:29 PM  Ignacia Felling, MD

## 2015-11-05 LAB — CBC
Hematocrit: 26.4 % — ABNORMAL LOW (ref 37.0–47.0)
Hemoglobin: 7.4 g/dL — ABNORMAL LOW (ref 12.0–16.0)
MCH: 16.6 pg — ABNORMAL LOW (ref 27.0–31.3)
MCHC: 27.9 % — ABNORMAL LOW (ref 33.0–37.0)
MCV: 59.5 fL — ABNORMAL LOW (ref 82.0–100.0)
Platelets: 265 10*3/uL (ref 130–400)
RBC: 4.44 M/uL (ref 4.20–5.40)
RDW: 24.5 % — ABNORMAL HIGH (ref 11.5–14.5)
WBC: 11.4 10*3/uL — ABNORMAL HIGH (ref 4.8–10.8)

## 2015-11-05 LAB — IRON AND TIBC
Iron Saturation: 4 % — ABNORMAL LOW (ref 11–46)
Iron: 14 ug/dL — ABNORMAL LOW (ref 37–145)
TIBC: 354 ug/dL (ref 178–450)

## 2015-11-19 NOTE — Telephone Encounter (Signed)
Called pt regarding labs from 11/04/15.    Dr Angela Cox stated that her anemia is improving & to follow-up with him in 2 weeks.     Pt already has an appt 12/04/15.     Pt aware of all of this.

## 2015-12-04 ENCOUNTER — Encounter

## 2015-12-04 ENCOUNTER — Ambulatory Visit: Admit: 2015-12-04 | Discharge: 2015-12-04 | Payer: MEDICARE | Attending: Family Medicine | Primary: Family Medicine

## 2015-12-04 DIAGNOSIS — E1169 Type 2 diabetes mellitus with other specified complication: Secondary | ICD-10-CM

## 2015-12-04 MED ORDER — GLIMEPIRIDE 4 MG PO TABS
4 | ORAL_TABLET | Freq: Once | ORAL | 1 refills | Status: DC
Start: 2015-12-04 — End: 2016-01-28

## 2015-12-04 MED ORDER — ATORVASTATIN CALCIUM 10 MG PO TABS
10 | ORAL_TABLET | ORAL | 1 refills | Status: DC
Start: 2015-12-04 — End: 2016-07-20

## 2015-12-04 MED ORDER — PANTOPRAZOLE SODIUM 40 MG PO TBEC
40 MG | ORAL_TABLET | Freq: Every day | ORAL | 1 refills | Status: DC
Start: 2015-12-04 — End: 2016-06-01

## 2015-12-04 MED ORDER — ALENDRONATE SODIUM 70 MG PO TABS
70 | ORAL_TABLET | ORAL | 3 refills | Status: DC
Start: 2015-12-04 — End: 2016-07-20

## 2015-12-04 MED ORDER — VALSARTAN 80 MG PO TABS
80 | ORAL_TABLET | ORAL | 1 refills | Status: DC
Start: 2015-12-04 — End: 2016-07-20

## 2015-12-04 MED ORDER — METFORMIN HCL ER 500 MG PO TB24
500 | ORAL_TABLET | ORAL | 1 refills | Status: DC
Start: 2015-12-04 — End: 2016-07-20

## 2015-12-04 MED ORDER — FERROUS SULFATE 324 (65 FE) MG PO TBEC
324 | ORAL_TABLET | Freq: Every day | ORAL | 3 refills | Status: DC
Start: 2015-12-04 — End: 2015-12-17

## 2015-12-04 MED ORDER — DAPAGLIFLOZIN PROPANEDIOL 10 MG PO TABS
10 | ORAL_TABLET | Freq: Every morning | ORAL | 0 refills | Status: DC
Start: 2015-12-04 — End: 2016-06-17

## 2015-12-04 MED ORDER — SITAGLIPTIN PHOSPHATE 100 MG PO TABS
100 | ORAL_TABLET | ORAL | 1 refills | Status: DC
Start: 2015-12-04 — End: 2016-07-20

## 2015-12-04 MED ORDER — AMLODIPINE BESYLATE 10 MG PO TABS
10 | ORAL_TABLET | ORAL | 1 refills | Status: DC
Start: 2015-12-04 — End: 2016-07-20

## 2015-12-04 NOTE — Progress Notes (Signed)
Subjective:      Patient ID: Lori Chase is a 75 y.o. female who presents today for:  Chief Complaint   Patient presents with   ??? Diabetes     patient following up for chronic type 2 diabetes. patient currently taking farxiga, januvia, amaryl and metformin. patient las A1C was 7.1   ??? Hypertension     patient following up for chronic HTN. patient currently taking Norvasc and Diovan -blood pressure has been controlled. patient reports    ??? Discuss Medications     patient states she needs to change medication nexium and amaryl due to insurance coverage.        Diabetes   She presents for her follow-up diabetic visit. She has type 2 (HEMOGLOBIN A1C 11/04/15: 7.1) diabetes mellitus. Pertinent negatives for hypoglycemia include no headaches or sweats. Pertinent negatives for diabetes include no blurred vision, no chest pain, no fatigue, no foot paresthesias, no foot ulcerations, no polydipsia, no polyphagia, no polyuria, no visual change, no weakness and no weight loss. Symptoms are stable. Current diabetic treatments: farxiga, januvia, amaryl, metformin. She is compliant with treatment all of the time. Her weight is stable.   Hypertension   This is a chronic problem. The current episode started more than 1 year ago. The problem is controlled. Pertinent negatives include no anxiety, blurred vision, chest pain, headaches, malaise/fatigue, neck pain, orthopnea, palpitations, peripheral edema, PND, shortness of breath or sweats. Risk factors for coronary artery disease include diabetes mellitus. Treatments tried: norvasc, diovan. The current treatment provides mild improvement. There are no compliance problems.      States that her glucose was 180 this morning. She thinks it is from eating a lot of bread & chocolate yesterday.     Pt needs to discuss the medications Nexium & Amaryl - states that her insurance isn't covering them anymore.    Past Medical History:   Diagnosis Date   ??? Anxiety    ??? Cancer (HCC)     breast    ??? Colon polyp    ??? Hypertension    ??? Osteopenia     last DXA 10/2014   ??? Right knee pain 01/07/2012   ??? 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 Type II or unspecified type diabetes mellitus without mention of complication, not stated as uncontrolled      Past Surgical History:   Procedure Laterality Date   ??? BREAST LUMPECTOMY  2007   ??? CHOLECYSTECTOMY     ??? COLONOSCOPY  09/18/12    [name]   ??? TONSILLECTOMY  age 26     Family History   Problem Relation Age of Onset   ??? High Blood Pressure Mother    ??? High Blood Pressure Father      Social History     Social History   ??? Marital status: Married     Spouse name: N/A   ??? Number of children: N/A   ??? Years of education: N/A     Occupational History   ??? Not on file.     Social History Main Topics   ??? Smoking status: Never Smoker   ??? Smokeless tobacco: Never Used   ??? Alcohol use Yes      Comment: occ wine   ??? Drug use: No   ??? Sexual activity: Not on file     Other Topics Concern   ??? Not on file     Social History Narrative     Allergies:  Morphine; Oxycontin [oxycodone]; Penicillins; and Invokana [canagliflozin]    Review of Systems   Constitutional: Negative for fatigue, malaise/fatigue and weight loss.   Eyes: Negative for blurred vision.   Respiratory: Negative for shortness of breath.    Cardiovascular: Negative for chest pain, palpitations, orthopnea and PND.   Endocrine: Negative for polydipsia, polyphagia and polyuria.   Musculoskeletal: Negative for neck pain.   Neurological: Negative for weakness and headaches.       Objective:   BP 136/74   Pulse 96   Temp 97.8 ??F (36.6 ??C) (Tympanic)    Resp 16   Ht 5' 7.5" (1.715 m)   Wt 144 lb (65.3 kg)   SpO2 99%   BMI 22.22 kg/m2    Physical Exam      PHYSICAL EXAMINATION:   VITAL SIGNS: are as recorded.  GENERAL:  The patient appears well nourished and well-developed, and with normal affect.  No acute respiratory distress. Alert and oriented times 3. No skin rashes.     HEENT:  TMs normal bilaterally.  Canals and ears  normal. Pharynx is clear. Extraocular eye motions intact and pain free.   Pupils reactive and equally round.  Sclerae and conjunctivae clear, normocephalic and atraumatic.      RESPIRATORY:  Clear and equal breath sounds with no acute respiratory distress.       HEART: Regular rhythm without murmur, rub or gallop.     ABDOMEN:  soft, nontender. No masses, guarding or rebound. Normoactive bowel sounds.     LOW BACK: No tenderness to palpation of inferior lumbar spine or either sacroiliac joint area.       Assessment:     1. DM type 2 with diabetic dyslipidemia (HCC)  dapagliflozin (FARXIGA) 10 MG tablet    metFORMIN (GLUCOPHAGE-XR) 500 MG extended release tablet    SITagliptin (JANUVIA) 100 MG tablet    atorvastatin (LIPITOR) 10 MG tablet    Hemoglobin A1C    glimepiride (AMARYL) 4 MG tablet   2. Essential hypertension  amLODIPine (NORVASC) 10 MG tablet    valsartan (DIOVAN) 80 MG tablet    Comprehensive Metabolic Panel   3. Osteoporosis  alendronate (FOSAMAX) 70 MG tablet   4. Osteopenia  alendronate (FOSAMAX) 70 MG tablet   5. Hyperlipidemia, unspecified hyperlipidemia type  Lipid Panel   6. Fatigue, unspecified type  CBC Auto Differential    Iron and TIBC   7. Gastroesophageal reflux disease with esophagitis  pantoprazole (PROTONIX) 40 MG tablet   8. Iron deficiency anemia, unspecified iron deficiency anemia type  CBC Auto Differential    Iron and TIBC       Plan:      Orders Placed This Encounter   Procedures   ??? CBC Auto Differential     Standing Status:   Future     Number of Occurrences:   1     Standing Expiration Date:   12/03/2016   ??? Comprehensive Metabolic Panel     Standing Status:   Future     Number of Occurrences:   1     Standing Expiration Date:   12/03/2016   ??? Lipid Panel     Standing Status:   Future     Number of Occurrences:   1     Standing Expiration Date:   12/03/2016     Order Specific Question:   Is Patient Fasting?/# of Hours     Answer:   yes / 12 hrs   ??? Hemoglobin A1C  Standing Status:    Future     Number of Occurrences:   1     Standing Expiration Date:   12/03/2016   ??? Iron and TIBC     Standing Status:   Future     Number of Occurrences:   1     Standing Expiration Date:   12/03/2016     Order Specific Question:   Is Patient Fasting?     Answer:   no     Order Specific Question:   No of Hours?     Answer:   0     Orders Placed This Encounter   Medications   ??? dapagliflozin (FARXIGA) 10 MG tablet     Sig: Take 1 tablet by mouth every morning     Dispense:  28 tablet     Refill:  0   ??? metFORMIN (GLUCOPHAGE-XR) 500 MG extended release tablet     Sig: TAKE 1 TABLET BY MOUTH 4 TIMES A DAY     Dispense:  360 tablet     Refill:  1   ??? SITagliptin (JANUVIA) 100 MG tablet     Sig: TAKE 1 TABLET BY MOUTH EVERY DAY.     Dispense:  90 tablet     Refill:  1   ??? atorvastatin (LIPITOR) 10 MG tablet     Sig: TAKE 1 TABLET BY MOUTH EVERY DAY.     Dispense:  90 tablet     Refill:  1   ??? alendronate (FOSAMAX) 70 MG tablet     Sig: TAKE 1 TABLET BY MOUTH ONCE A WEEK     Dispense:  12 tablet     Refill:  3   ??? amLODIPine (NORVASC) 10 MG tablet     Sig: TAKE 1 TABLET BY MOUTH EVERY DAY.     Dispense:  90 tablet     Refill:  1   ??? valsartan (DIOVAN) 80 MG tablet     Sig: TAKE 1 TABLET BY MOUTH DAILY     Dispense:  90 tablet     Refill:  1   ??? pantoprazole (PROTONIX) 40 MG tablet     Sig: Take 1 tablet by mouth daily     Dispense:  90 tablet     Refill:  1   ??? glimepiride (AMARYL) 4 MG tablet     Sig: TAKE 2 TABLETS DAILY     Dispense:  180 tablet     Refill:  1   ??? ferrous sulfate 324 (65 Fe) MG EC tablet     Sig: Take 1 tablet by mouth daily (with breakfast)     Dispense:  90 tablet     Refill:  3     See bw stable    Controlled Substances Monitoring:      Return in about 4 weeks (around 01/01/2016).    I, Domenic Schwab, RMA  , am scribing for and in the presence of Ignacia Felling, MD. Electronically signed by :  Domenic Schwab, RMA    I, Ignacia Felling, MD, personally performed the services described in this  documentation, as scribed by Domenic Schwab, RMA   in my presence, and it is both accurate and complete. Electronically signed by: Ignacia Felling, MD    12/08/15 10:59 PM    Ignacia Felling, MD

## 2015-12-05 LAB — COMPREHENSIVE METABOLIC PANEL
ALT: 17 U/L (ref 0–33)
AST: 20 U/L (ref 0–35)
Albumin: 4 g/dL (ref 3.9–4.9)
Alkaline Phosphatase: 60 U/L (ref 40–130)
Anion Gap: 19 mEq/L — ABNORMAL HIGH (ref 7–13)
BUN: 14 mg/dL (ref 8–23)
CO2: 16 mEq/L — ABNORMAL LOW (ref 22–29)
Calcium: 9.2 mg/dL (ref 8.6–10.2)
Chloride: 98 mEq/L (ref 98–107)
Creatinine: 0.59 mg/dL (ref 0.50–0.90)
GFR African American: >60 (ref >60)
GFR Non-African American: >60 (ref >60)
Globulin: 3.2 g/dL (ref 2.3–3.5)
Glucose: 217 mg/dL — ABNORMAL HIGH (ref 74–109)
Potassium: 5.2 mEq/L — ABNORMAL HIGH (ref 3.5–5.1)
Sodium: 133 mEq/L (ref 132–144)
Total Bilirubin: 0.2 mg/dL (ref 0.0–1.2)
Total Protein: 7.2 g/dL (ref 6.4–8.1)

## 2015-12-05 LAB — CBC WITH AUTO DIFFERENTIAL
Basophils %: 0.5 %
Basophils Absolute: 0.1 10*3/uL (ref 0.0–0.2)
Eosinophils %: 1.7 %
Eosinophils Absolute: 0.2 10*3/uL (ref 0.0–0.7)
Hematocrit: 27.3 % — ABNORMAL LOW (ref 37.0–47.0)
Hemoglobin: 7.7 g/dL — ABNORMAL LOW (ref 12.0–16.0)
Lymphocytes %: 16.1 %
Lymphocytes Absolute: 2.1 10*3/uL (ref 1.0–4.8)
MCH: 16.9 pg — ABNORMAL LOW (ref 27.0–31.3)
MCHC: 28.1 % — ABNORMAL LOW (ref 33.0–37.0)
MCV: 60.3 fL — ABNORMAL LOW (ref 82.0–100.0)
Monocytes %: 8.3 %
Monocytes Absolute: 1.1 10*3/uL — ABNORMAL HIGH (ref 0.2–0.8)
Neutrophils %: 73.4 %
Neutrophils Absolute: 9.5 10*3/uL — ABNORMAL HIGH (ref 1.4–6.5)
PLATELET SLIDE REVIEW: ADEQUATE
Platelets: 281 10*3/uL (ref 130–400)
RBC: 4.52 M/uL (ref 4.20–5.40)
RDW: 23.7 % — ABNORMAL HIGH (ref 11.5–14.5)
WBC: 12.9 10*3/uL — ABNORMAL HIGH (ref 4.8–10.8)

## 2015-12-05 LAB — LIPID PANEL
Cholesterol, Total: 133 mg/dL (ref 0–199)
HDL: 52 mg/dL (ref 40–59)
LDL Calculated: 54 mg/dL (ref 0–129)
Triglycerides: 134 mg/dL (ref 0–200)

## 2015-12-05 LAB — IRON AND TIBC
Iron Saturation: 4 % — ABNORMAL LOW (ref 11–46)
Iron: 14 ug/dL — ABNORMAL LOW (ref 37–145)
TIBC: 360 ug/dL (ref 178–450)

## 2015-12-05 LAB — HEMOGLOBIN A1C: Hemoglobin A1C: 7 % — ABNORMAL HIGH (ref 4.8–5.9)

## 2015-12-17 MED ORDER — FERROUS SULFATE 324 (65 FE) MG PO TBEC
324 | ORAL_TABLET | Freq: Every day | ORAL | 1 refills | Status: DC
Start: 2015-12-17 — End: 2016-06-01

## 2015-12-17 NOTE — Telephone Encounter (Signed)
Last seen 12/04/2015

## 2016-01-01 ENCOUNTER — Ambulatory Visit: Admit: 2016-01-01 | Discharge: 2016-01-01 | Payer: MEDICARE | Attending: Family Medicine | Primary: Family Medicine

## 2016-01-01 ENCOUNTER — Encounter

## 2016-01-01 DIAGNOSIS — E1169 Type 2 diabetes mellitus with other specified complication: Secondary | ICD-10-CM

## 2016-01-01 NOTE — Progress Notes (Signed)
Subjective:      Patient ID: Lori Chase is a 75 y.o. female who presents today for:  Chief Complaint   Patient presents with   ??? Diabetes     patient is here for a follow up on her diabetes. states it is going ok at the moment   ??? Hypertension     patient is here for a follow up on her hypertension. denies  chest pain, palpitations, dizziness, headaches, lightheadedness.   ??? Depression     patient states she has been depressed recently. states this has been ongoing for 6-7 months. states she doesn't like to take so many medications   ??? Constipation     states she is having abdominal cramping and constipation since being placed on iron supplement.       HPI Comments:     Depression  Patient c/o feeling depressed x 6-7 months. Patient also states that she does not want to start any medication due to already being on a lot of medications.     Diabetes   She presents for her follow-up diabetic visit. She has type 2 diabetes mellitus. Her disease course has been stable. Pertinent negatives for hypoglycemia include no confusion, dizziness or headaches. Pertinent negatives for diabetes include no chest pain and no fatigue. There are no hypoglycemic complications. Symptoms are stable. Risk factors for coronary artery disease include diabetes mellitus, dyslipidemia, hypertension and post-menopausal. Current diabetic treatment includes oral agent (triple therapy) (metformin, farxiga, januvia, amaryl). She is compliant with treatment all of the time. Her weight is stable.   Hypertension   This is a chronic problem. The current episode started more than 1 year ago. The problem has been gradually improving since onset. The problem is controlled. Pertinent negatives include no chest pain, headaches or shortness of breath. Risk factors for coronary artery disease include post-menopausal state, dyslipidemia and diabetes mellitus. Treatments tried: norvasc, Diovan. The current treatment provides moderate improvement. There are  no compliance problems.    Constipation   This is a new problem. The current episode started more than 1 month ago. The problem is unchanged. The stool is described as firm. The patient is on a high fiber diet. There has been adequate water intake. Associated symptoms include abdominal pain and bloating. Pertinent negatives include no anorexia, diarrhea or difficulty urinating. She has tried nothing for the symptoms. The treatment provided no relief.   Hyperlipidemia   This is a chronic problem. The current episode started more than 1 year ago. The problem is controlled. Recent lipid tests were reviewed and are normal. Exacerbating diseases include diabetes. Pertinent negatives include no chest pain or shortness of breath. Current antihyperlipidemic treatment includes statins (lipitor). The current treatment provides significant improvement of lipids. There are no compliance problems.  Risk factors for coronary artery disease include hypertension, diabetes mellitus, dyslipidemia and post-menopausal.       Past Medical History:   Diagnosis Date   ??? Anxiety    ??? Cancer (HCC)     breast   ??? Colon polyp    ??? Hypertension    ??? Osteopenia     last DXA 10/2014   ??? Right knee pain 01/07/2012   ??? 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 Type II or unspecified type diabetes mellitus without mention of complication, not stated as uncontrolled      Past Surgical History:   Procedure Laterality Date   ??? BREAST LUMPECTOMY  2007   ??? CHOLECYSTECTOMY     ???  COLONOSCOPY  09/18/12    DR Andy Gauss   ??? TONSILLECTOMY  age 74     Family History   Problem Relation Age of Onset   ??? High Blood Pressure Mother    ??? High Blood Pressure Father      Social History     Social History   ??? Marital status: Married     Spouse name: N/A   ??? Number of children: N/A   ??? Years of education: N/A     Occupational History   ??? Not on file.     Social History Main Topics   ??? Smoking status: Never Smoker   ??? Smokeless tobacco: Never Used   ??? Alcohol use Yes       Comment: occ wine   ??? Drug use: No   ??? Sexual activity: Not on file     Other Topics Concern   ??? Not on file     Social History Narrative     Allergies:  Morphine; Oxycontin [oxycodone]; Penicillins; and Invokana [canagliflozin]    Review of Systems   Constitutional: Negative for fatigue.   Respiratory: Negative for shortness of breath.    Cardiovascular: Negative for chest pain.   Gastrointestinal: Positive for abdominal pain, bloating and constipation. Negative for anorexia and diarrhea.   Genitourinary: Negative for difficulty urinating.   Neurological: Negative for dizziness, light-headedness and headaches.   Psychiatric/Behavioral: Negative for confusion.       Objective:   BP 124/62 (Site: Right Arm, Position: Sitting, Cuff Size: Medium Adult)   Pulse 87   Temp 98 ??F (36.7 ??C) (Tympanic)    Resp 16   Ht 5' 7.5" (1.715 m)   Wt 143 lb (64.9 kg)   SpO2 98%   Breastfeeding? No   BMI 22.07 kg/m2    Physical Exam      PHYSICAL EXAMINATION:   VITAL SIGNS: are as recorded.  GENERAL:  The patient appears well nourished and well-developed, and with normal affect.  No acute respiratory distress. Alert and oriented times 3. No skin rashes.     HEENT:  TMs normal bilaterally.  Canals and ears normal. Pharynx is clear. Extraocular eye motions intact and pain free.   Pupils reactive and equally round.  Sclerae and conjunctivae clear, normocephalic and atraumatic.      RESPIRATORY:  Clear and equal breath sounds with no acute respiratory distress.       HEART: Regular rhythm without murmur, rub or gallop.     ABDOMEN:  Soft,diffusely tender. No masses, guarding or rebound. Normoactive bowel sounds.     LOW BACK: No tenderness to palpation of inferior lumbar spine or either sacroiliac joint area.       Assessment:     1. DM type 2 with diabetic dyslipidemia ([place])     2. Iron deficiency anemia, unspecified iron deficiency anemia type  CT ABDOMEN PELVIS W IV CONTRAST Additional Contrast? IV AND ORAL    CT Abdomen W IV Contrast    3. Essential hypertension     4. Other and unspecified hyperlipidemia     5. Abdominal pain, unspecified location  CT ABDOMEN PELVIS W IV CONTRAST Additional Contrast? IV AND ORAL    CT Abdomen W IV Contrast   6. Fatigue, unspecified type  CBC Auto Differential    Comprehensive Metabolic Panel       Plan:      Orders Placed This Encounter   Procedures   ??? CT ABDOMEN PELVIS W IV CONTRAST Additional  Contrast? IV AND ORAL     Standing Status:   Future     Standing Expiration Date:   12/31/2016     Order Specific Question:   Additional Contrast?     Answer:   IV AND ORAL   ??? CT Abdomen W IV Contrast     Standing Status:   Future     Standing Expiration Date:   12/31/2016   ??? CBC Auto Differential     Standing Status:   Future     Number of Occurrences:   1     Standing Expiration Date:   12/31/2016   ??? Comprehensive Metabolic Panel     Standing Status:   Future     Number of Occurrences:   1     Standing Expiration Date:   12/31/2016     No orders of the defined types were placed in this encounter.      Controlled Substances Monitoring:      Return in about 8 days (around 01/09/2016).    Darden Palmer. Cox, RMA   , am scribing for and in the presence of Ignacia Felling, MD. Electronically signed by :  Colen Darling. Cox, RMA     I, Ignacia Felling, MD, personally performed the services described in this documentation, as scribed by Colen Darling. Cox, RMA    in my presence, and it is both accurate and complete. Electronically signed by: Ignacia Felling, MD    01/02/16 12:15 AM    Ignacia Felling, MD

## 2016-01-02 ENCOUNTER — Inpatient Hospital Stay: Admit: 2016-01-02 | Discharge: 2016-01-02 | Payer: MEDICARE | Primary: Family Medicine

## 2016-01-02 DIAGNOSIS — K5900 Constipation, unspecified: Secondary | ICD-10-CM

## 2016-01-02 LAB — CBC WITH AUTO DIFFERENTIAL
Basophils %: 0.5 %
Basophils Absolute: 0.1 10*3/uL (ref 0.0–0.2)
Eosinophils %: 1.3 %
Eosinophils Absolute: 0.2 10*3/uL (ref 0.0–0.7)
Hematocrit: 26.8 % — ABNORMAL LOW (ref 37.0–47.0)
Hemoglobin: 7.5 g/dL — ABNORMAL LOW (ref 12.0–16.0)
Lymphocytes %: 15.8 %
Lymphocytes Absolute: 2.5 10*3/uL (ref 1.0–4.8)
MCH: 16.8 pg — ABNORMAL LOW (ref 27.0–31.3)
MCHC: 28.1 % — ABNORMAL LOW (ref 33.0–37.0)
MCV: 59.7 fL — ABNORMAL LOW (ref 82.0–100.0)
Monocytes %: 6.3 %
Monocytes Absolute: 1 10*3/uL — ABNORMAL HIGH (ref 0.2–0.8)
Neutrophils %: 76.1 %
Neutrophils Absolute: 12 10*3/uL — ABNORMAL HIGH (ref 1.4–6.5)
PLATELET SLIDE REVIEW: ADEQUATE
Platelets: 273 10*3/uL (ref 130–400)
RBC: 4.49 M/uL (ref 4.20–5.40)
RDW: 22.9 % — ABNORMAL HIGH (ref 11.5–14.5)
WBC: 15.8 10*3/uL — ABNORMAL HIGH (ref 4.8–10.8)

## 2016-01-02 LAB — COMPREHENSIVE METABOLIC PANEL
ALT: 15 U/L (ref 0–33)
AST: 18 U/L (ref 0–35)
Albumin: 4.1 g/dL (ref 3.9–4.9)
Alkaline Phosphatase: 60 U/L (ref 40–130)
Anion Gap: 24 mEq/L — ABNORMAL HIGH (ref 7–13)
BUN: 19 mg/dL (ref 8–23)
CO2: 21 mEq/L — ABNORMAL LOW (ref 22–29)
Calcium: 9.9 mg/dL (ref 8.6–10.2)
Chloride: 97 mEq/L — ABNORMAL LOW (ref 98–107)
Creatinine: 0.77 mg/dL (ref 0.50–0.90)
GFR African American: >60 (ref >60)
GFR Non-African American: >60 (ref >60)
Globulin: 3.5 g/dL (ref 2.3–3.5)
Glucose: 184 mg/dL — ABNORMAL HIGH (ref 74–109)
Potassium: 5.4 mEq/L — ABNORMAL HIGH (ref 3.5–5.1)
Sodium: 142 mEq/L (ref 132–144)
Total Bilirubin: 0.2 mg/dL (ref 0.0–1.2)
Total Protein: 7.6 g/dL (ref 6.4–8.1)

## 2016-01-02 MED ORDER — BARIUM SULFATE 2.1 % PO SUSP
2.1 % | Freq: Once | ORAL | Status: AC | PRN
Start: 2016-01-02 — End: 2016-01-02
  Administered 2016-01-02: 16:00:00 450 mL via ORAL

## 2016-01-02 MED ORDER — IOVERSOL 68 % IV SOLN
68 % | Freq: Once | INTRAVENOUS | Status: AC | PRN
Start: 2016-01-02 — End: 2016-01-02
  Administered 2016-01-02: 16:00:00 100 mL via INTRAVENOUS

## 2016-01-09 ENCOUNTER — Ambulatory Visit: Admit: 2016-01-09 | Discharge: 2016-01-09 | Payer: MEDICARE | Attending: Family Medicine | Primary: Family Medicine

## 2016-01-09 DIAGNOSIS — E785 Hyperlipidemia, unspecified: Secondary | ICD-10-CM

## 2016-01-09 NOTE — Progress Notes (Signed)
Subjective:      Patient ID: Lori Chase is a 75 y.o. female who presents today for:  Chief Complaint   Patient presents with   ??? Results     MRI       Diabetes   She presents for her follow-up diabetic visit. She has type 2 diabetes mellitus. Her disease course has been stable. Pertinent negatives for hypoglycemia include no dizziness or headaches. Pertinent negatives for diabetes include no blurred vision, no chest pain and no fatigue. There are no hypoglycemic complications. Symptoms are improving. Risk factors for coronary artery disease include dyslipidemia, diabetes mellitus and post-menopausal. Current diabetic treatment includes oral agent (triple therapy) (metformin, farxiga, januvia, amaryl). She is compliant with treatment all of the time. Her weight is stable.   Hyperlipidemia   This is a chronic problem. The current episode started more than 1 year ago. The problem is controlled. Recent lipid tests were reviewed and are normal. Exacerbating diseases include diabetes. Pertinent negatives include no chest pain, focal weakness, leg pain, myalgias or shortness of breath. Current antihyperlipidemic treatment includes statins (lipitor). The current treatment provides significant improvement of lipids. There are no compliance problems.  Risk factors for coronary artery disease include post-menopausal, dyslipidemia and diabetes mellitus.       Past Medical History:   Diagnosis Date   ??? Anxiety    ??? Cancer (HCC)     breast   ??? Colon polyp    ??? Hypertension    ??? Osteopenia     last DXA 10/2014   ??? Right knee pain 01/07/2012   ??? 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 Type II or unspecified type diabetes mellitus without mention of complication, not stated as uncontrolled      Past Surgical History:   Procedure Laterality Date   ??? BREAST LUMPECTOMY  2007   ??? CHOLECYSTECTOMY     ??? COLONOSCOPY  09/18/12    [name]   ??? TONSILLECTOMY  age 75     Family History   Problem Relation Age of Onset   ??? High Blood  Pressure Mother    ??? High Blood Pressure Father      Social History     Social History   ??? Marital status: Married     Spouse name: N/A   ??? Number of children: N/A   ??? Years of education: N/A     Occupational History   ??? Not on file.     Social History Main Topics   ??? Smoking status: Never Smoker   ??? Smokeless tobacco: Never Used   ??? Alcohol use Yes      Comment: occ wine   ??? Drug use: No   ??? Sexual activity: Not on file     Other Topics Concern   ??? Not on file     Social History Narrative     Allergies:  Morphine; Oxycontin [oxycodone]; Penicillins; and Invokana [canagliflozin]    Review of Systems   Constitutional: Negative for fatigue.   Eyes: Negative for blurred vision.   Respiratory: Negative for chest tightness and shortness of breath.    Cardiovascular: Negative for chest pain.   Musculoskeletal: Negative for myalgias.   Neurological: Negative for dizziness, focal weakness and headaches.       Objective:   BP 138/66 (Site: Right Arm, Position: Sitting, Cuff Size: Medium Adult)   Pulse 92   Temp 98 ??F (36.7 ??C) (Oral)    Resp 12   Wt  142 lb 12.8 oz (64.8 kg)   BMI 22.04 kg/m2    Physical Exam      PHYSICAL EXAMINATION:   VITAL SIGNS: are as recorded.  GENERAL:  The patient appears well nourished and well-developed, and with normal affect.  No acute respiratory distress. Alert and oriented times 3. No skin rashes.     HEENT:  TMs normal bilaterally.  Canals and ears normal. Pharynx is clear. Extraocular eye motions intact and pain free.   Pupils reactive and equally round.  Sclerae and conjunctivae clear, normocephalic and atraumatic.      RESPIRATORY:  Clear and equal breath sounds with no acute respiratory distress.       HEART: Regular rhythm without murmur, rub or gallop.     ABDOMEN:  soft, nontender. No masses, guarding or rebound. Normoactive bowel sounds.     LOW BACK: No tenderness to palpation of inferior lumbar spine or either sacroiliac joint area.       Assessment:     1. DM type 2 with diabetic  dyslipidemia ([place])     2. Iron deficiency anemia, unspecified iron deficiency anemia type     3. Other and unspecified hyperlipidemia         Plan:      No orders of the defined types were placed in this encounter.    No orders of the defined types were placed in this encounter.    Cbc & cmp next    Refuse ref to heme/onc    The current medical regimen is effective;  continue present plan and medications.    Controlled Substances Monitoring:      Return in about 4 weeks (around 02/06/2016).    Darden Palmer. Cox, RMA   , am scribing for and in the presence of Ignacia Felling, MD. Electronically signed by :  Colen Darling. Cox, RMA       I, Ignacia Felling, MD, personally performed the services described in this documentation, as scribed by Colen Darling. Cox, RMA    in my presence, and it is both accurate and complete. Electronically signed by: Ignacia Felling, MD    01/13/16 6:34 AM    Ignacia Felling, MD

## 2016-01-22 ENCOUNTER — Encounter

## 2016-01-22 MED ORDER — BAYER BREEZE 2 SYSTEM W/DEVICE KIT
PACK | 0 refills | Status: DC
Start: 2016-01-22 — End: 2016-02-06

## 2016-01-28 ENCOUNTER — Encounter

## 2016-01-28 MED ORDER — GLIMEPIRIDE 4 MG PO TABS
4 MG | ORAL_TABLET | ORAL | 1 refills | Status: DC
Start: 2016-01-28 — End: 2016-07-20

## 2016-01-28 NOTE — Telephone Encounter (Signed)
Last seen 01/09/2016

## 2016-02-06 ENCOUNTER — Encounter

## 2016-02-06 ENCOUNTER — Ambulatory Visit: Admit: 2016-02-06 | Discharge: 2016-02-06 | Payer: MEDICARE | Attending: Family Medicine | Primary: Family Medicine

## 2016-02-06 DIAGNOSIS — E119 Type 2 diabetes mellitus without complications: Secondary | ICD-10-CM

## 2016-02-06 MED ORDER — BAYER MICROLET LANCETS MISC
1 refills | Status: DC
Start: 2016-02-06 — End: 2016-02-06

## 2016-02-06 MED ORDER — GLUCOSE BLOOD VI STRP
3 refills | Status: DC
Start: 2016-02-06 — End: 2016-10-29

## 2016-02-06 MED ORDER — ACCU-CHEK NANO SMARTVIEW W/DEVICE KIT
PACK | 0 refills | Status: AC
Start: 2016-02-06 — End: ?

## 2016-02-06 MED ORDER — ACCU-CHEK FASTCLIX LANCETS MISC
3 refills | Status: DC
Start: 2016-02-06 — End: 2017-05-31

## 2016-02-06 MED ORDER — BAYER BREEZE 2 SYSTEM W/DEVICE KIT
PACK | 0 refills | Status: DC
Start: 2016-02-06 — End: 2016-02-06

## 2016-02-06 NOTE — Progress Notes (Signed)
Vaccine Information Sheet, "Influenza - Inactivated"  given to Lori Chase, or parent/legal guardian of  Lori Chase and verbalized understanding.    Patient responses:    Have you ever had a reaction to a flu vaccine? No  Are you able to eat eggs without adverse effects?  Yes  Do you have any current illness?  No  Have you ever had Guillian Barre Syndrome?  No    Flu vaccine given per order. Please see immunization tab.

## 2016-02-06 NOTE — Progress Notes (Signed)
Subjective:      Patient ID: Lori Chase is a 75 y.o. female who presents today for:  Chief Complaint   Patient presents with   ??? Diabetes     Pt. is here for a f/u on Diabetes. Pt. states she has been taking Iron pills and says she isng going to take it anymore. She will take it in food form instead.   ??? Abdominal Cramping     Pt. states she has cramping in her stomach that keeps her up at night ever since she has been taking the iron. Pt. states its the intestine that is having the cramping. Pt. rates the pain as 6/10.        Diabetes   She presents for her follow-up diabetic visit. She has type 2 diabetes mellitus. Her disease course has been fluctuating. Pertinent negatives for hypoglycemia include no confusion, dizziness, headaches, hunger, mood changes, nervousness/anxiousness, pallor, seizures, sleepiness, speech difficulty, sweats or tremors. Associated symptoms include blurred vision, foot paresthesias and visual change. Pertinent negatives for diabetes include no chest pain, no fatigue, no foot ulcerations, no polydipsia, no polyphagia, no polyuria, no weakness and no weight loss. There are no hypoglycemic complications. There are no diabetic complications. Risk factors for coronary artery disease include hypertension. Current diabetic treatments: see med list. She is compliant with treatment all of the time. She is following a generally healthy diet. Her home blood glucose trend is fluctuating minimally.   Abdominal Cramping   This is a new problem. The current episode started 1 to 4 weeks ago. The onset quality is sudden. The problem occurs constantly. The problem has been unchanged. The pain is located in the generalized abdominal region. The pain is at a severity of 6/10. The pain is moderate. The quality of the pain is aching. Pertinent negatives include no anorexia, arthralgias, belching, constipation, diarrhea, dysuria, fever, flatus, frequency, headaches, hematochezia, hematuria, melena,  myalgias, nausea, vomiting or weight loss. Exacerbated by: Iron supplement. The pain is relieved by nothing. She has tried nothing for the symptoms.     Patient states she is not going to continue with the iron supplement. She states she will be getting her iron from food sources from this point on.     Past Medical History:   Diagnosis Date   ??? Anxiety    ??? Cancer (HCC)     breast   ??? Colon polyp    ??? Hypertension    ??? Osteopenia     last DXA 10/2014   ??? Right knee pain 01/07/2012   ??? 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 Type II or unspecified type diabetes mellitus without mention of complication, not stated as uncontrolled      Past Surgical History:   Procedure Laterality Date   ??? BREAST LUMPECTOMY  2007   ??? CHOLECYSTECTOMY     ??? COLONOSCOPY  09/18/12    [name]   ??? TONSILLECTOMY  age 22     Family History   Problem Relation Age of Onset   ??? High Blood Pressure Mother    ??? High Blood Pressure Father      Social History     Social History   ??? Marital status: Married     Spouse name: N/A   ??? Number of children: N/A   ??? Years of education: N/A     Occupational History   ??? Not on file.     Social History Main Topics   ??? Smoking  status: Never Smoker   ??? Smokeless tobacco: Never Used   ??? Alcohol use Yes      Comment: occ wine   ??? Drug use: No   ??? Sexual activity: Not on file     Other Topics Concern   ??? Not on file     Social History Narrative     Allergies:  Morphine; Oxycontin [oxycodone]; Penicillins; and Invokana [canagliflozin]    Review of Systems   Constitutional: Negative for activity change, appetite change, chills, diaphoresis, fatigue, fever and weight loss.   HENT: Negative for congestion, ear discharge, ear pain, facial swelling, hearing loss and mouth sores.    Eyes: Positive for blurred vision and visual disturbance. Negative for photophobia, pain, discharge and redness.   Respiratory: Negative for apnea, choking, chest tightness, wheezing and stridor.    Cardiovascular: Negative for chest pain,  palpitations and leg swelling.   Gastrointestinal: Negative for abdominal pain, anorexia, constipation, diarrhea, flatus, hematochezia, melena, nausea and vomiting.   Endocrine: Negative for cold intolerance, heat intolerance, polydipsia, polyphagia and polyuria.   Genitourinary: Negative for dysuria, frequency and hematuria.   Musculoskeletal: Negative for arthralgias, gait problem, joint swelling, myalgias, neck pain and neck stiffness.   Skin: Negative for color change, pallor, rash and wound.   Allergic/Immunologic: Negative for immunocompromised state.   Neurological: Positive for numbness (bilateral feet). Negative for dizziness, tremors, seizures, syncope, facial asymmetry, speech difficulty, weakness and headaches.   Psychiatric/Behavioral: Negative for confusion and hallucinations. The patient is not nervous/anxious and is not hyperactive.        Objective:   BP (!) 110/52 (Site: Right Arm, Position: Sitting, Cuff Size: Large Adult)   Pulse 100   Temp 97.8 ??F (36.6 ??C) (Oral)    Resp 14   Ht 5' 7.5" (1.715 m)   Wt 140 lb (63.5 kg)   BMI 21.6 kg/m2    Physical Exam      PHYSICAL EXAMINATION:   VITAL SIGNS: are as recorded.  GENERAL:  The patient appears well nourished and well-developed, and with normal affect.  No acute respiratory distress. Alert and oriented times 3. No skin rashes.     HEENT:  TMs normal bilaterally.  Canals and ears normal. Pharynx is clear. Extraocular eye motions intact and pain free.   Pupils reactive and equally round.  Sclerae and conjunctivae clear, normocephalic and atraumatic.      RESPIRATORY:  Clear and equal breath sounds with no acute respiratory distress.       HEART: Regular rhythm without murmur, rub or gallop.     ABDOMEN:  soft, nontender. No masses, guarding or rebound. Normoactive bowel sounds.     NECK: No masses or adenopathy palpable.  No bruits heard. No asymmetry visible.     COMPLETE EXTREMITIES: No edema, all four extremities with posterior tibial and radial  pulses strong and palpable.     EXTREMITIES:  no edema in any extremity. No cyanosis or clubbing.    LOW BACK: No tenderness to palpation of inferior lumbar spine or either sacroiliac joint area.       Assessment:     1. Type 2 diabetes mellitus with other specified complication (HCC)  DISCONTINUED: Blood Glucose Monitoring Suppl (BAYER BREEZE 2 SYSTEM) w/Device KIT   2. Type 2 diabetes mellitus without complication, unspecified long term insulin use status  Comprehensive Metabolic Panel    DISCONTINUED: BAYER MICROLET LANCETS MISC   3. Iron deficiency anemia, unspecified iron deficiency anemia type  CBC  Iron And Tibc   4. Need for influenza vaccination  INFLUENZA, HIGH DOSE, 65 YRS +, IM, PF, PREFILL SYR, 0.5ML (FLUZONE HD)       Plan:      Orders Placed This Encounter   Procedures   ??? INFLUENZA, HIGH DOSE, 65 YRS +, IM, PF, PREFILL SYR, 0.5ML (FLUZONE HD)   ??? CBC     Standing Status:   Future     Number of Occurrences:   1     Standing Expiration Date:   02/05/2017   ??? Comprehensive Metabolic Panel     Standing Status:   Future     Number of Occurrences:   1     Standing Expiration Date:   02/05/2017   ??? Iron And Tibc     Standing Status:   Future     Number of Occurrences:   1     Standing Expiration Date:   02/05/2017     Order Specific Question:   Is Patient Fasting?     Answer:   yes     Order Specific Question:   No of Hours?     Answer:   12     Orders Placed This Encounter   Medications   ??? DISCONTD: Blood Glucose Monitoring Suppl (BAYER BREEZE 2 SYSTEM) w/Device KIT     Sig: Check daily DX: E11.9     Dispense:  1 kit     Refill:  0   ??? DISCONTD: BAYER MICROLET LANCETS MISC     Sig: Once daily     Dispense:  100 each     Refill:  1     Dx 250.00   ??? Blood Glucose Monitoring Suppl (ACCU-CHEK NANO SMARTVIEW) w/Device KIT     Sig: Use to check daily DX: E11.9     Dispense:  1 kit     Refill:  0   ??? glucose blood VI test strips (ACCU-CHEK SMARTVIEW) strip     Sig: Check daily DX: E11.9     Dispense:  100 each      Refill:  3   ??? ACCU-CHEK FASTCLIX LANCETS MISC     Sig: Check daily DX: E11.9     Dispense:  100 each     Refill:  3       Controlled Substances Monitoring:      Return in about 1 week (around 02/13/2016).    I, Melissa A. Lennerth,CMA   , am scribing for and in the presence of Ignacia Felling, MD. Electronically signed by :  Gracy Bruins. Katrine Coho, MD, personally performed the services described in this documentation, as scribed by Melissa A. Lennerth,CMA    in my presence, and it is both accurate and complete. Electronically signed by: Ignacia Felling, MD    02/22/16 11:59 PM    Ignacia Felling, MD

## 2016-02-06 NOTE — Telephone Encounter (Signed)
Lori Chase from [facility] calling to see if the bayer breeze 2 glucose monitor and lancets. This is not covered but they will cover accucheck,prodigy, true test, true track and true metric. Their # is [id number].

## 2016-02-06 NOTE — Telephone Encounter (Signed)
ok 

## 2016-02-06 NOTE — Telephone Encounter (Signed)
accu-chek sent over instead per CB

## 2016-02-07 LAB — COMPREHENSIVE METABOLIC PANEL
ALT: 17 U/L (ref 0–33)
AST: 21 U/L (ref 0–35)
Albumin: 4.2 g/dL (ref 3.9–4.9)
Alkaline Phosphatase: 51 U/L (ref 40–130)
Anion Gap: 18 mEq/L — ABNORMAL HIGH (ref 7–13)
BUN: 14 mg/dL (ref 8–23)
CO2: 21 mEq/L — ABNORMAL LOW (ref 22–29)
Calcium: 9.6 mg/dL (ref 8.6–10.2)
Chloride: 99 mEq/L (ref 98–107)
Creatinine: 0.59 mg/dL (ref 0.50–0.90)
GFR African American: >60 (ref >60)
GFR Non-African American: >60 (ref >60)
Globulin: 3.4 g/dL (ref 2.3–3.5)
Glucose: 101 mg/dL (ref 74–109)
Potassium: 5.1 mEq/L (ref 3.5–5.1)
Sodium: 138 mEq/L (ref 132–144)
Total Bilirubin: 0.2 mg/dL (ref 0.0–1.2)
Total Protein: 7.6 g/dL (ref 6.4–8.1)

## 2016-02-07 LAB — CBC
Hematocrit: 28.2 % — ABNORMAL LOW (ref 37.0–47.0)
Hemoglobin: 7.8 g/dL — ABNORMAL LOW (ref 12.0–16.0)
MCH: 16.6 pg — ABNORMAL LOW (ref 27.0–31.3)
MCHC: 27.5 % — ABNORMAL LOW (ref 33.0–37.0)
MCV: 60.4 fL — ABNORMAL LOW (ref 82.0–100.0)
Platelets: 350 10*3/uL (ref 130–400)
RBC: 4.67 M/uL (ref 4.20–5.40)
RDW: 22.7 % — ABNORMAL HIGH (ref 11.5–14.5)
WBC: 13.9 10*3/uL — ABNORMAL HIGH (ref 4.8–10.8)

## 2016-02-07 LAB — IRON AND TIBC
Iron Saturation: 4 % — ABNORMAL LOW (ref 11–46)
Iron: 16 ug/dL — ABNORMAL LOW (ref 37–145)
TIBC: 392 ug/dL (ref 178–450)

## 2016-02-13 ENCOUNTER — Ambulatory Visit: Admit: 2016-02-13 | Discharge: 2016-02-13 | Payer: MEDICARE | Attending: Family Medicine | Primary: Family Medicine

## 2016-02-13 DIAGNOSIS — D509 Iron deficiency anemia, unspecified: Secondary | ICD-10-CM

## 2016-02-13 MED ORDER — DAPAGLIFLOZIN PROPANEDIOL 10 MG PO TABS
10 MG | ORAL_TABLET | Freq: Every morning | ORAL | 3 refills | Status: DC
Start: 2016-02-13 — End: 2016-07-20

## 2016-02-13 NOTE — Progress Notes (Signed)
Subjective:      Patient ID: Lori Chase is a 75 y.o. female who presents today for:  Chief Complaint   Patient presents with   ??? Diabetes     patient following up for chronic DM. patient is currently taking [place], metformin, farxiga, and amaryl. patients last A1C was 7.0  patient reports NO chest pain, sob,dizziness, or headaches.    ??? Discuss Labs     patient would like to discuss labs done on 9/15 to check her iron deficiency.    ??? Hypertension     patient following up for chronic HTN. patient is currently taking norvasc and diovan- blood pressure has been controlled.        Diabetes   She presents for her follow-up diabetic visit. She has type 2 diabetes mellitus. Her disease course has been stable. There are no hypoglycemic associated symptoms. Pertinent negatives for hypoglycemia include no confusion, headaches, nervousness/anxiousness, pallor, speech difficulty, sweats or tremors. Pertinent negatives for diabetes include no blurred vision, no chest pain, no fatigue, no foot paresthesias, no foot ulcerations, no polydipsia, no polyphagia, no polyuria, no visual change, no weakness and no weight loss. There are no hypoglycemic complications. Symptoms are stable. There are no diabetic complications. Current diabetic treatments: see med list. She is compliant with treatment all of the time. She is following a generally healthy and low fiber diet. When asked about meal planning, she reported none.   Hypertension   This is a chronic problem. The current episode started more than 1 month ago. The problem is unchanged. The problem is controlled. Pertinent negatives include no anxiety, blurred vision, chest pain, headaches, malaise/fatigue, neck pain, orthopnea, palpitations, peripheral edema, PND, shortness of breath or sweats. Risk factors for coronary artery disease include diabetes mellitus and post-menopausal state. Treatments tried: Norvasc. The current treatment provides significant improvement. There are  no compliance problems.    Discuss Labs  Patient would also like to follow up on her labs done 09/15. She is concerned with Iron Deficiency. She states she is taking Iron supplement every other day. She does state she is going to start taking it daily as she is no longer having abdominal pain associated with iron supplement.      She does refuse a consult at this time with Hematology/Oncology.     Past Medical History:   Diagnosis Date   ??? Anxiety    ??? Cancer (HCC)     breast   ??? Colon polyp    ??? Hypertension    ??? Osteopenia     last DXA 10/2014   ??? Right knee pain 01/07/2012   ??? 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 Type II or unspecified type diabetes mellitus without mention of complication, not stated as uncontrolled      Past Surgical History:   Procedure Laterality Date   ??? BREAST LUMPECTOMY  2007   ??? CHOLECYSTECTOMY     ??? COLONOSCOPY  09/18/12    [name]   ??? TONSILLECTOMY  age 38     Family History   Problem Relation Age of Onset   ??? High Blood Pressure Mother    ??? High Blood Pressure Father      Social History     Social History   ??? Marital status: Married     Spouse name: N/A   ??? Number of children: N/A   ??? Years of education: N/A     Occupational History   ??? Not on file.  Social History Main Topics   ??? Smoking status: Never Smoker   ??? Smokeless tobacco: Never Used   ??? Alcohol use Yes      Comment: occ wine   ??? Drug use: No   ??? Sexual activity: Not on file     Other Topics Concern   ??? Not on file     Social History Narrative     Allergies:  Morphine; Oxycontin [oxycodone]; Penicillins; and Invokana [canagliflozin]    Review of Systems   Constitutional: Negative for activity change, appetite change, chills, diaphoresis, fatigue, fever, malaise/fatigue and weight loss.   HENT: Negative for congestion, ear discharge, ear pain, facial swelling, hearing loss and mouth sores.    Eyes: Negative for blurred vision, photophobia, pain, discharge and redness.   Respiratory: Negative for apnea, choking, chest  tightness, shortness of breath, wheezing and stridor.    Cardiovascular: Negative for chest pain, palpitations, orthopnea, leg swelling and PND.   Gastrointestinal: Negative for abdominal pain, constipation, diarrhea and nausea.   Endocrine: Negative for cold intolerance, heat intolerance, polydipsia, polyphagia and polyuria.   Genitourinary: Negative for dysuria and frequency.   Musculoskeletal: Negative for gait problem, joint swelling, neck pain and neck stiffness.   Skin: Negative for color change, pallor, rash and wound.   Allergic/Immunologic: Negative for immunocompromised state.   Neurological: Negative for tremors, syncope, facial asymmetry, speech difficulty, weakness and headaches.   Psychiatric/Behavioral: Negative for confusion and hallucinations. The patient is not nervous/anxious and is not hyperactive.        Objective:   BP 124/66   Pulse 86   Temp 97.9 ??F (36.6 ??C) (Tympanic)    Resp 15   Ht 5\' 7"  (1.702 m)   Wt 141 lb (64 kg)   SpO2 99%   BMI 22.08 kg/m2    Physical Exam      PHYSICAL EXAMINATION:   VITAL SIGNS: are as recorded.  GENERAL:  The patient appears well nourished and well-developed, and with normal affect.  No acute respiratory distress. Alert and oriented times 3. No skin rashes.     HEENT:  TMs normal bilaterally.  Canals and ears normal. Pharynx is clear. Extraocular eye motions intact and pain free.   Pupils reactive and equally round.  Sclerae and conjunctivae clear, normocephalic and atraumatic.      RESPIRATORY:  Clear and equal breath sounds with no acute respiratory distress.       HEART: Regular rhythm without murmur, rub or gallop.     ABDOMEN:  soft, nontender. No masses, guarding or rebound. Normoactive bowel sounds.     LOW BACK: No tenderness to palpation of inferior lumbar spine or either sacroiliac joint area.       Assessment:     1. Iron deficiency anemia, unspecified iron deficiency anemia type     2. DM type 2 with diabetic dyslipidemia (HCC)  dapagliflozin  (FARXIGA) 10 MG tablet   3. Essential hypertension     4. Other and unspecified hyperlipidemia         Plan:      No orders of the defined types were placed in this encounter.    Orders Placed This Encounter   Medications   ??? dapagliflozin (FARXIGA) 10 MG tablet     Sig: Take 1 tablet by mouth every morning     Dispense:  42 tablet     Refill:  3     Repeat bw next    The current medical regimen is effective;  continue present plan and medications.     refuses heme/onc consult    Controlled Substances Monitoring:      Return in about 4 weeks (around 03/12/2016).    I, Melissa A. Lennerth,CMA   , am scribing for and in the presence of Ignacia Felling, MD. Electronically signed by :  Gracy Bruins. Katrine Coho, MD, personally performed the services described in this documentation, as scribed by Melissa A. Lennerth,CMA    in my presence, and it is both accurate and complete. Electronically signed by: Ignacia Felling, MD    02/20/16 5:23 AM    Ignacia Felling, MD

## 2016-02-17 ENCOUNTER — Ambulatory Visit: Admit: 2016-02-17 | Discharge: 2016-02-17 | Payer: MEDICARE | Attending: Family Medicine | Primary: Family Medicine

## 2016-02-17 DIAGNOSIS — E785 Hyperlipidemia, unspecified: Secondary | ICD-10-CM

## 2016-02-17 NOTE — Progress Notes (Signed)
Subjective:      Patient ID: Lori Chase is a 75 y.o. female who presents today for:  Chief Complaint   Patient presents with   ??? Diabetes     pt here to discuss her glucose machine       Diabetes   She presents for her follow-up diabetic visit. Her disease course has been improving. Pertinent negatives for hypoglycemia include no confusion, dizziness or headaches. Pertinent negatives for diabetes include no blurred vision, no chest pain and no fatigue. There are no hypoglycemic complications. Symptoms are improving. Risk factors for coronary artery disease include post-menopausal, hypertension and diabetes mellitus. Current diabetic treatment includes oral agent (triple therapy). She is compliant with treatment all of the time. Her weight is stable.     Fu gerd,oa,lipids    Chronic,stable    Past Medical History:   Diagnosis Date   ??? Anxiety    ??? Cancer (HCC)     breast   ??? Colon polyp    ??? Hypertension    ??? Osteopenia     last DXA 10/2014   ??? Right knee pain 01/07/2012   ??? 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 Type II or unspecified type diabetes mellitus without mention of complication, not stated as uncontrolled      Past Surgical History:   Procedure Laterality Date   ??? BREAST LUMPECTOMY  2007   ??? CHOLECYSTECTOMY     ??? COLONOSCOPY  09/18/12    [name]   ??? TONSILLECTOMY  age 66     Family History   Problem Relation Age of Onset   ??? High Blood Pressure Mother    ??? High Blood Pressure Father      Social History     Social History   ??? Marital status: Married     Spouse name: N/A   ??? Number of children: N/A   ??? Years of education: N/A     Occupational History   ??? Not on file.     Social History Main Topics   ??? Smoking status: Never Smoker   ??? Smokeless tobacco: Never Used   ??? Alcohol use Yes      Comment: occ wine   ??? Drug use: No   ??? Sexual activity: Not on file     Other Topics Concern   ??? Not on file     Social History Narrative     Allergies:  Morphine; Oxycontin [oxycodone]; Penicillins; and Invokana  [canagliflozin]    Review of Systems   Constitutional: Negative for fatigue.   Eyes: Negative for blurred vision.   Respiratory: Negative for chest tightness and shortness of breath.    Cardiovascular: Negative for chest pain.   Neurological: Negative for dizziness and headaches.   Psychiatric/Behavioral: Negative for confusion.       Objective:   BP 118/78   Pulse 84   Resp 14   Ht 5' 7.5" (1.715 m)   Wt 139 lb (63 kg)   BMI 21.45 kg/m2    Physical Exam      PHYSICAL EXAMINATION:   VITAL SIGNS: are as recorded.  GENERAL:  The patient appears well nourished and well-developed, and with normal affect.  No acute respiratory distress. Alert and oriented times 3. No skin rashes.     HEENT:  TMs normal bilaterally.  Canals and ears normal. Pharynx is clear. Extraocular eye motions intact and pain free.   Pupils reactive and equally round.  Sclerae and conjunctivae clear,  normocephalic and atraumatic.      RESPIRATORY:  Clear and equal breath sounds with no acute respiratory distress.       HEART: Regular rhythm without murmur, rub or gallop.     ABDOMEN:  soft, nontender. No masses, guarding or rebound. Normoactive bowel sounds.     LOW BACK: No tenderness to palpation of inferior lumbar spine or either sacroiliac joint area.       Assessment:     1. DM type 2 with diabetic dyslipidemia ([place])     2. Gastroesophageal reflux disease with esophagitis     3. Primary osteoarthritis of both knees     4. Other and unspecified hyperlipidemia         Plan:      No orders of the defined types were placed in this encounter.    No orders of the defined types were placed in this encounter.      Controlled Substances Monitoring:      No Follow-up on file.    Darden Palmer. Cox, RMA   , am scribing for and in the presence of Ignacia Felling, MD. Electronically signed by :  Colen Darling. Cox, RMA     I, Ignacia Felling, MD, personally performed the services described in this documentation, as scribed by Colen Darling. Cox, RMA    in my presence,  and it is both accurate and complete. Electronically signed by: Ignacia Felling, MD    02/17/16 4:14 PM    Ignacia Felling, MD

## 2016-03-18 ENCOUNTER — Ambulatory Visit: Admit: 2016-03-18 | Discharge: 2016-03-18 | Payer: MEDICARE | Attending: Family Medicine | Primary: Family Medicine

## 2016-03-18 ENCOUNTER — Encounter

## 2016-03-18 DIAGNOSIS — E1169 Type 2 diabetes mellitus with other specified complication: Secondary | ICD-10-CM

## 2016-03-18 NOTE — Progress Notes (Signed)
Subjective:      Patient ID: Lori Chase is a 75 y.o. female who presents today for:  Chief Complaint   Patient presents with   ??? Diabetes     Pt. is here for a f/u on DM. Pt. is currently taking Glimperide, Farxiga and Metformin. Pt. checked her sugar this morning and it was 104. Pt. isnt having any issues today. Pt. thinks she is to have labs done due to her iron deficiency.        Diabetes   She presents for her follow-up diabetic visit. She has type 2 diabetes mellitus. Her disease course has been stable. Pertinent negatives for hypoglycemia include no confusion, dizziness, headaches or nervousness/anxiousness. Pertinent negatives for diabetes include no blurred vision, no chest pain, no fatigue, no polydipsia, no polyphagia, no weakness and no weight loss. Symptoms are stable. Risk factors for coronary artery disease include diabetes mellitus, hypertension and post-menopausal. Current diabetic treatment includes oral agent (triple therapy). She is compliant with treatment all of the time.   Hypertension   This is a chronic problem. The current episode started more than 1 year ago. The problem has been gradually improving since onset. The problem is controlled. Pertinent negatives include no blurred vision, chest pain, headaches, PND or shortness of breath. Risk factors for coronary artery disease include post-menopausal state and diabetes mellitus. Treatments tried: norvasc, diovan. The current treatment provides moderate improvement. There are no compliance problems.        Past Medical History:   Diagnosis Date   ??? Anxiety    ??? Cancer (HCC)     breast   ??? Colon polyp    ??? Hypertension    ??? Osteopenia     last DXA 10/2014   ??? Right knee pain 01/07/2012   ??? 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 Type II or unspecified type diabetes mellitus without mention of complication, not stated as uncontrolled      Past Surgical History:   Procedure Laterality Date   ??? BREAST LUMPECTOMY  2007   ??? CHOLECYSTECTOMY      ??? COLONOSCOPY  09/18/12    [name]   ??? TONSILLECTOMY  age 48     Family History   Problem Relation Age of Onset   ??? High Blood Pressure Mother    ??? High Blood Pressure Father      Social History     Social History   ??? Marital status: Married     Spouse name: N/A   ??? Number of children: N/A   ??? Years of education: N/A     Occupational History   ??? Not on file.     Social History Main Topics   ??? Smoking status: Never Smoker   ??? Smokeless tobacco: Never Used   ??? Alcohol use Yes      Comment: occ wine   ??? Drug use: No   ??? Sexual activity: Not on file     Other Topics Concern   ??? Not on file     Social History Narrative   ??? No narrative on file     Allergies:  Morphine; Oxycontin [oxycodone]; Penicillins; and Invokana [canagliflozin]    Review of Systems   Constitutional: Negative for fatigue and weight loss.   Eyes: Negative for blurred vision.   Respiratory: Negative for shortness of breath.    Cardiovascular: Negative for chest pain and PND.   Endocrine: Negative for polydipsia and polyphagia.   Neurological: Negative for  dizziness, weakness and headaches.   Psychiatric/Behavioral: Negative for confusion. The patient is not nervous/anxious.        Objective:   BP (!) 124/58 (Site: Right Arm, Position: Sitting, Cuff Size: Medium Adult)    Pulse 88    Temp 97.7 ??F (36.5 ??C) (Oral)    Resp 14    Ht 5' 6.5" (1.689 m)    Wt 141 lb (64 kg)    BMI 22.42 kg/m??     Physical Exam      PHYSICAL EXAMINATION:   VITAL SIGNS: are as recorded.  GENERAL:  The patient appears well nourished and well-developed, and with normal affect.  No acute respiratory distress. Alert and oriented times 3. No skin rashes.     HEENT:  TMs normal bilaterally.  Canals and ears normal. Pharynx is clear. Extraocular eye motions intact and pain free.   Pupils reactive and equally round.  Sclerae and conjunctivae clear, normocephalic and atraumatic.      RESPIRATORY:  Clear and equal breath sounds with no acute respiratory distress.       HEART: Regular  rhythm without murmur, rub or gallop.     ABDOMEN:  soft, nontender. No masses, guarding or rebound. Normoactive bowel sounds.     NECK: No masses or adenopathy palpable.  No bruits heard. No asymmetry visible.     COMPLETE EXTREMITIES: No edema, all four extremities with posterior tibial and radial pulses strong and palpable.     EXTREMITIES:  no edema in any extremity. No cyanosis or clubbing.    LOW BACK: No tenderness to palpation of inferior lumbar spine or either sacroiliac joint area.       Assessment:     1. DM type 2 with diabetic dyslipidemia (HCC)  Hemoglobin A1C   2. Essential hypertension     3. Iron deficiency anemia, unspecified iron deficiency anemia type  Iron and TIBC   4. Vitamin D deficiency  Vitamin D 25 Hydroxy   5. Fatigue, unspecified type  CBC Auto Differential    Comprehensive Metabolic Panel   6. Hyperlipidemia, unspecified hyperlipidemia type  Lipid Panel       Plan:      Orders Placed This Encounter   Procedures   ??? CBC Auto Differential     Standing Status:   Future     Number of Occurrences:   1     Standing Expiration Date:   03/18/2017   ??? Comprehensive Metabolic Panel     Standing Status:   Future     Number of Occurrences:   1     Standing Expiration Date:   03/18/2017   ??? Lipid Panel     Standing Status:   Future     Number of Occurrences:   1     Standing Expiration Date:   03/18/2017     Order Specific Question:   Is Patient Fasting?/# of Hours     Answer:   yes   ??? Hemoglobin A1C     Standing Status:   Future     Number of Occurrences:   1     Standing Expiration Date:   03/18/2017   ??? Vitamin D 25 Hydroxy     Standing Status:   Future     Number of Occurrences:   1     Standing Expiration Date:   03/18/2017   ??? Iron and TIBC     Standing Status:   Future     Number of Occurrences:  1     Standing Expiration Date:   03/18/2017     Order Specific Question:   Is Patient Fasting?     Answer:   yes     Order Specific Question:   No of Hours?     Answer:   3     No orders of the  defined types were placed in this encounter.      Controlled Substances Monitoring:      No Follow-up on file.    Darden Palmer. Cox, RMA   , am scribing for and in the presence of Ignacia Felling, MD. Electronically signed by :  Colen Darling. Cox, RMA

## 2016-03-19 LAB — CBC WITH AUTO DIFFERENTIAL
Basophils %: 0.6 %
Basophils Absolute: 0.1 10*3/uL (ref 0.0–0.2)
Eosinophils %: 1.9 %
Eosinophils Absolute: 0.3 10*3/uL (ref 0.0–0.7)
Hematocrit: 28.2 % — ABNORMAL LOW (ref 37.0–47.0)
Hemoglobin: 7.7 g/dL — ABNORMAL LOW (ref 12.0–16.0)
Lymphocytes %: 18.2 %
Lymphocytes Absolute: 2.8 10*3/uL (ref 1.0–4.8)
MCH: 17 pg — ABNORMAL LOW (ref 27.0–31.3)
MCHC: 27.4 % — ABNORMAL LOW (ref 33.0–37.0)
MCV: 61.9 fL — ABNORMAL LOW (ref 82.0–100.0)
Monocytes %: 6.1 %
Monocytes Absolute: 0.9 10*3/uL — ABNORMAL HIGH (ref 0.2–0.8)
Neutrophils %: 73.2 %
Neutrophils Absolute: 11.4 10*3/uL — ABNORMAL HIGH (ref 1.4–6.5)
PLATELET SLIDE REVIEW: NORMAL
Platelets: 289 10*3/uL (ref 130–400)
RBC: 4.56 M/uL (ref 4.20–5.40)
RDW: 23.9 % — ABNORMAL HIGH (ref 11.5–14.5)
WBC: 15.5 10*3/uL — ABNORMAL HIGH (ref 4.8–10.8)

## 2016-03-19 LAB — COMPREHENSIVE METABOLIC PANEL
ALT: 16 U/L (ref 0–33)
AST: 16 U/L (ref 0–35)
Albumin: 4 g/dL (ref 3.9–4.9)
Alkaline Phosphatase: 57 U/L (ref 40–130)
Anion Gap: 19 mEq/L — ABNORMAL HIGH (ref 7–13)
BUN: 13 mg/dL (ref 8–23)
CO2: 19 mEq/L — ABNORMAL LOW (ref 22–29)
Calcium: 10.1 mg/dL (ref 8.6–10.2)
Chloride: 100 mEq/L (ref 98–107)
Creatinine: 0.64 mg/dL (ref 0.50–0.90)
GFR African American: >60 (ref >60)
GFR Non-African American: >60 (ref >60)
Globulin: 3.3 g/dL (ref 2.3–3.5)
Glucose: 161 mg/dL — ABNORMAL HIGH (ref 74–109)
Potassium: 5.3 mEq/L — ABNORMAL HIGH (ref 3.5–5.1)
Sodium: 138 mEq/L (ref 132–144)
Total Bilirubin: 0.1 mg/dL (ref 0.0–1.2)
Total Protein: 7.3 g/dL (ref 6.4–8.1)

## 2016-03-19 LAB — IRON AND TIBC
Iron Saturation: 3 % — ABNORMAL LOW (ref 11–46)
Iron: 13 ug/dL — ABNORMAL LOW (ref 37–145)
TIBC: 383 ug/dL (ref 178–450)

## 2016-03-19 LAB — LIPID PANEL
Cholesterol, Total: 128 mg/dL (ref 0–199)
HDL: 42 mg/dL (ref 40–59)
LDL Calculated: 50 mg/dL (ref 0–129)
Triglycerides: 182 mg/dL (ref 0–200)

## 2016-03-19 LAB — VITAMIN D 25 HYDROXY: Vit D, 25-Hydroxy: 30.9 ng/mL (ref 30.0–100.0)

## 2016-03-19 LAB — HEMOGLOBIN A1C: Hemoglobin A1C: 6 % — ABNORMAL HIGH (ref 4.8–5.9)

## 2016-04-21 ENCOUNTER — Encounter

## 2016-04-21 ENCOUNTER — Ambulatory Visit: Admit: 2016-04-21 | Discharge: 2016-04-21 | Payer: MEDICARE | Attending: Family Medicine | Primary: Family Medicine

## 2016-04-21 DIAGNOSIS — I10 Essential (primary) hypertension: Secondary | ICD-10-CM

## 2016-04-21 NOTE — Progress Notes (Signed)
Subjective:      Patient ID: Lori Chase is a 75 y.o. female who presents today for:  Chief Complaint   Patient presents with   ??? Diabetes     Presents for 1 month f/u for diabetes - Informs has been taking [place], Metformin and Glimeperide - States has been checking sugar levels at home and this morning was 146 - Informs overall ranges from 98-146   ??? Results     Informs is needing to know results from blood work done on 03/18/16       Diabetes   She presents for her follow-up diabetic visit. She has type 2 diabetes mellitus. Her disease course has been stable. Pertinent negatives for hypoglycemia include no confusion, dizziness or headaches. Pertinent negatives for diabetes include no blurred vision, no chest pain and no fatigue. There are no hypoglycemic complications. Symptoms are stable. Risk factors for coronary artery disease include hypertension, diabetes mellitus and post-menopausal. Current diabetic treatment includes oral agent (triple therapy). She is compliant with treatment all of the time.   Hypertension   This is a chronic problem. The current episode started more than 1 year ago. The problem has been gradually improving since onset. The problem is controlled. Pertinent negatives include no blurred vision, chest pain, headaches or shortness of breath. Risk factors for coronary artery disease include dyslipidemia, diabetes mellitus and post-menopausal state. Treatments tried: norvasc, valsartan. The current treatment provides significant improvement. There are no compliance problems.        Past Medical History:   Diagnosis Date   ??? Anxiety    ??? Cancer (HCC)     breast   ??? Colon polyp    ??? Hypertension    ??? Osteopenia     last DXA 10/2014   ??? Right knee pain 01/07/2012   ??? 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 Type II or unspecified type diabetes mellitus without mention of complication, not stated as uncontrolled      Past Surgical History:   Procedure Laterality Date   ??? BREAST LUMPECTOMY   2007   ??? CHOLECYSTECTOMY     ??? COLONOSCOPY  09/18/12    [name]   ??? TONSILLECTOMY  age 75     Family History   Problem Relation Age of Onset   ??? High Blood Pressure Mother    ??? High Blood Pressure Father      Social History     Social History   ??? Marital status: Married     Spouse name: N/A   ??? Number of children: N/A   ??? Years of education: N/A     Occupational History   ??? Not on file.     Social History Main Topics   ??? Smoking status: Never Smoker   ??? Smokeless tobacco: Never Used   ??? Alcohol use Yes      Comment: occ wine   ??? Drug use: No   ??? Sexual activity: Not on file     Other Topics Concern   ??? Not on file     Social History Narrative   ??? No narrative on file     Allergies:  Morphine; Oxycontin [oxycodone]; Penicillins; and Invokana [canagliflozin]    Review of Systems   Constitutional: Negative for fatigue.   Eyes: Negative for blurred vision.   Respiratory: Negative for chest tightness and shortness of breath.    Cardiovascular: Negative for chest pain.   Neurological: Negative for dizziness and headaches.  Psychiatric/Behavioral: Negative for confusion.       Objective:   BP 128/68 (Site: Right Arm, Position: Sitting, Cuff Size: Medium Adult)    Pulse 80    Temp 97 ??F (36.1 ??C) (Temporal)    Resp 18    Ht 5' 6.5" (1.689 m)    Wt 143 lb 1.6 oz (64.9 kg)    SpO2 98%    BMI 22.75 kg/m??     Physical Exam      PHYSICAL EXAMINATION:   VITAL SIGNS: are as recorded.  GENERAL:  The patient appears well nourished and well-developed, and with normal affect.  No acute respiratory distress. Alert and oriented times 3. No skin rashes.     HEENT:  TMs normal bilaterally.  Canals and ears normal. Pharynx is clear. Extraocular eye motions intact and pain free.   Pupils reactive and equally round.  Sclerae and conjunctivae clear, normocephalic and atraumatic.      RESPIRATORY:  Clear and equal breath sounds with no acute respiratory distress.       HEART: Regular rhythm without murmur, rub or gallop.     ABDOMEN:  soft,  nontender. No masses, guarding or rebound. Normoactive bowel sounds.     LOW BACK: No tenderness to palpation of inferior lumbar spine or either sacroiliac joint area.       Assessment:     1. Essential hypertension     2. DM type 2 with diabetic dyslipidemia ([place])     3. Primary osteoarthritis of both knees     4. Iron deficiency anemia, unspecified iron deficiency anemia type  Iron and TIBC   5. Fatigue, unspecified type  CBC Auto Differential    Comprehensive Metabolic Panel       Plan:      Orders Placed This Encounter   Procedures   ??? CBC Auto Differential     Standing Status:   Future     Number of Occurrences:   1     Standing Expiration Date:   04/21/2017   ??? Comprehensive Metabolic Panel     Standing Status:   Future     Number of Occurrences:   1     Standing Expiration Date:   04/21/2017   ??? Iron and TIBC     Standing Status:   Future     Number of Occurrences:   1     Standing Expiration Date:   04/21/2017     Order Specific Question:   Is Patient Fasting?     Answer:   yes     Order Specific Question:   No of Hours?     Answer:   4     No orders of the defined types were placed in this encounter.    Patient refuses hematology referral. Life threatening complications and end-organ damage of not proceeding with recommendations was explained to patient.     See bw impr    Controlled Substances Monitoring:      Return in about 5 weeks (around 05/26/2016).    Darden Palmer. Cox, RMA   , am scribing for and in the presence of Ignacia Felling, MD. Electronically signed by :  Colen Darling. Cox, RMA     I, Ignacia Felling, MD, personally performed the services described in this documentation, as scribed by Colen Darling. Cox, RMA    in my presence, and it is both accurate and complete. Electronically signed by: Ignacia Felling, MD    04/27/16  11:24 PM    Ignacia Felling, MD

## 2016-04-22 LAB — CBC WITH AUTO DIFFERENTIAL
Basophils %: 0.4 %
Basophils Absolute: 0 10*3/uL (ref 0.0–0.2)
Eosinophils %: 1.8 %
Eosinophils Absolute: 0.2 10*3/uL (ref 0.0–0.7)
Hematocrit: 30.1 % — ABNORMAL LOW (ref 37.0–47.0)
Hemoglobin: 8.6 g/dL — ABNORMAL LOW (ref 12.0–16.0)
Lymphocytes %: 16.6 %
Lymphocytes Absolute: 2.3 10*3/uL (ref 1.0–4.8)
MCH: 18.4 pg — ABNORMAL LOW (ref 27.0–31.3)
MCHC: 28.5 % — ABNORMAL LOW (ref 33.0–37.0)
MCV: 64.5 fL — ABNORMAL LOW (ref 82.0–100.0)
Monocytes %: 9.1 %
Monocytes Absolute: 1.2 10*3/uL — ABNORMAL HIGH (ref 0.2–0.8)
Neutrophils %: 72.1 %
Neutrophils Absolute: 9.9 10*3/uL — ABNORMAL HIGH (ref 1.4–6.5)
Platelets: 266 10*3/uL (ref 130–400)
RBC: 4.66 M/uL (ref 4.20–5.40)
RDW: 24.8 % — ABNORMAL HIGH (ref 11.5–14.5)
WBC: 13.7 10*3/uL — ABNORMAL HIGH (ref 4.8–10.8)

## 2016-04-22 LAB — COMPREHENSIVE METABOLIC PANEL
ALT: 14 U/L (ref 0–33)
AST: 14 U/L (ref 0–35)
Albumin: 3.9 g/dL (ref 3.9–4.9)
Alkaline Phosphatase: 52 U/L (ref 40–130)
Anion Gap: 12 mEq/L (ref 7–13)
BUN: 14 mg/dL (ref 8–23)
CO2: 25 mEq/L (ref 22–29)
Calcium: 9.8 mg/dL (ref 8.6–10.2)
Chloride: 100 mEq/L (ref 98–107)
Creatinine: 0.5 mg/dL (ref 0.50–0.90)
GFR African American: >60 (ref >60)
GFR Non-African American: >60 (ref >60)
Globulin: 3.3 g/dL (ref 2.3–3.5)
Glucose: 128 mg/dL — ABNORMAL HIGH (ref 74–109)
Potassium: 5 mEq/L (ref 3.5–5.1)
Sodium: 137 mEq/L (ref 132–144)
Total Bilirubin: 0.2 mg/dL (ref 0.0–1.2)
Total Protein: 7.2 g/dL (ref 6.4–8.1)

## 2016-04-22 LAB — IRON AND TIBC
Iron Saturation: 5 % — ABNORMAL LOW (ref 11–46)
Iron: 18 ug/dL — ABNORMAL LOW (ref 37–145)
TIBC: 341 ug/dL (ref 178–450)

## 2016-05-26 ENCOUNTER — Encounter: Attending: Family Medicine | Primary: Family Medicine

## 2016-05-28 ENCOUNTER — Ambulatory Visit: Admit: 2016-05-29 | Discharge: 2016-05-29 | Payer: MEDICARE | Attending: Medical | Primary: Family Medicine

## 2016-05-28 DIAGNOSIS — K5903 Drug induced constipation: Secondary | ICD-10-CM

## 2016-05-28 NOTE — Progress Notes (Signed)
Subjective     Lori Chase 76 y.o. female presents 06/01/16 with   Chief Complaint   Patient presents with   ??? Abdominal Pain     x2 weeks pt has c.o recurrent abdominal pain, vomiting x1 time,  and constipation.        HPI   Pt c/o constipation and crampy abdominal pain, generalized  Hx of constipation due to high Iron supplement  Tried benefiber and ex-lax, stool softener  No appetite  Pt states she just wants pain relief and then will f/u with her doctor for chronic issues    Reviewed the following history:    Past Medical History:   Diagnosis Date   ??? Anxiety    ??? Cancer (HCC)     breast   ??? Colon polyp    ??? Hypertension    ??? Osteopenia     last DXA 10/2014   ??? Right knee pain 01/07/2012   ??? 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 Type II or unspecified type diabetes mellitus without mention of complication, not stated as uncontrolled      Past Surgical History:   Procedure Laterality Date   ??? BREAST LUMPECTOMY  2007   ??? CHOLECYSTECTOMY     ??? COLONOSCOPY  09/18/12    [name]   ??? TONSILLECTOMY  age 39     Family History   Problem Relation Age of Onset   ??? High Blood Pressure Mother    ??? High Blood Pressure Father        Allergies   Allergen Reactions   ??? Morphine    ??? Oxycontin [Oxycodone]    ??? Penicillins Other (See Comments)   ??? Invokana [Canagliflozin]        Current Outpatient Prescriptions   Medication Sig Dispense Refill   ??? dapagliflozin (FARXIGA) 10 MG tablet Take 1 tablet by mouth every morning 42 tablet 3   ??? NEXIUM 40 MG delayed release capsule      ??? Blood Glucose Monitoring Suppl (ACCU-CHEK NANO SMARTVIEW) w/Device KIT Use to check daily DX: E11.9 1 kit 0   ??? glucose blood VI test strips (ACCU-CHEK SMARTVIEW) strip Check daily DX: E11.9 100 each 3   ??? ACCU-CHEK FASTCLIX LANCETS MISC Check daily DX: E11.9 100 each 3   ??? glimepiride (AMARYL) 4 MG tablet TAKE 2 TABLETS DAILY 180 tablet 1   ??? dapagliflozin (FARXIGA) 10 MG tablet Take 1 tablet by mouth every morning 28 tablet 0   ??? metFORMIN  (GLUCOPHAGE-XR) 500 MG extended release tablet TAKE 1 TABLET BY MOUTH 4 TIMES A DAY 360 tablet 1   ??? SITagliptin (JANUVIA) 100 MG tablet TAKE 1 TABLET BY MOUTH EVERY DAY. 90 tablet 1   ??? atorvastatin (LIPITOR) 10 MG tablet TAKE 1 TABLET BY MOUTH EVERY DAY. 90 tablet 1   ??? alendronate (FOSAMAX) 70 MG tablet TAKE 1 TABLET BY MOUTH ONCE A WEEK 12 tablet 3   ??? amLODIPine (NORVASC) 10 MG tablet TAKE 1 TABLET BY MOUTH EVERY DAY. 90 tablet 1   ??? valsartan (DIOVAN) 80 MG tablet TAKE 1 TABLET BY MOUTH DAILY 90 tablet 1   ??? aspirin (ASPIRIN CHILDRENS) 81 MG chewable tablet Take 1 tablet by mouth daily. 90 tablet 3   ??? traMADol (ULTRAM) 50 MG tablet Take 1 tablet by mouth every 6 hours as needed for Pain for up to 10 days. 50 tablet 0     No current facility-administered medications for this visit.  Review of Systems   Constitutional: Negative for chills and fever.   Respiratory: Negative for cough, shortness of breath and wheezing.    Gastrointestinal: Positive for constipation.       Objective    Vitals:    05/28/16 1934 06/01/16 2202   BP: (!) 152/70 (!) 140/70   Pulse: 106    Resp: 16    Temp: 98 ??F (36.7 ??C)    TempSrc: Tympanic    SpO2: 98%    Weight: 141 lb (64 kg)    Height: 5' 6.5" (1.689 m)        Physical Exam   Constitutional: She appears well-developed and well-nourished. No distress.   HENT:   Head: Normocephalic and atraumatic.   Right Ear: External ear normal.   Left Ear: External ear normal.   Nose: Nose normal.   Mouth/Throat: Oropharynx is clear and moist. No oropharyngeal exudate.   Eyes: Conjunctivae and EOM are normal. Pupils are equal, round, and reactive to light. Right eye exhibits no discharge. Left eye exhibits no discharge. No scleral icterus.   Neck: No tracheal deviation present.   Cardiovascular: Normal rate, regular rhythm and normal heart sounds.  Exam reveals no gallop and no friction rub.    No murmur heard.  Pulmonary/Chest: Effort normal. No stridor. No respiratory distress. She has  no wheezes. She has no rales. She exhibits no tenderness.   Abdominal: Soft. Bowel sounds are normal. She exhibits no distension and no mass. There is no tenderness. There is no rebound and no guarding.   Lymphadenopathy:     She has no cervical adenopathy.   Skin: Skin is warm and dry. No rash noted. She is not diaphoretic. No erythema. No pallor.   Vitals reviewed.      Assessment and Plan      ICD-10-CM ICD-9-CM    1. Drug-induced constipation K59.03 564.09      E980.5        Orders Placed This Encounter   Medications   ??? ketorolac (TORADOL) injection 60 mg       Reviewed with the patient: current clinical status, medications, activities and diet.     Side effects, adverse effects of the medication prescribed today, as well as treatment plan and result expectations have been discussed with the patient who expresses understanding and desires to proceed.    Close follow up to evaluate treatment results and for coordination of care.  I have reviewed the patient's medical history in detail and updated the computerized patient record.    Return if symptoms worsen or fail to improve, for follow up with PCP.    Vikki Ports, PA

## 2016-05-29 MED ORDER — KETOROLAC TROMETHAMINE 60 MG/2ML IM SOLN
60 MG/2ML | Freq: Once | INTRAMUSCULAR | Status: AC
Start: 2016-05-29 — End: 2016-05-28
  Administered 2016-05-29: 01:00:00 60 mg via INTRAMUSCULAR

## 2016-05-31 ENCOUNTER — Encounter: Attending: Family Medicine | Primary: Family Medicine

## 2016-06-01 ENCOUNTER — Ambulatory Visit: Admit: 2016-06-01 | Discharge: 2016-06-01 | Payer: MEDICARE | Attending: Family Medicine | Primary: Family Medicine

## 2016-06-01 DIAGNOSIS — E860 Dehydration: Secondary | ICD-10-CM

## 2016-06-01 MED ORDER — TRAMADOL HCL 50 MG PO TABS
50 MG | ORAL_TABLET | Freq: Four times a day (QID) | ORAL | 0 refills | Status: AC | PRN
Start: 2016-06-01 — End: 2016-06-11

## 2016-06-01 NOTE — Progress Notes (Signed)
Lori Chase 76 y.o. female presents today with   Chief Complaint   Patient presents with   ??? Abdominal Pain     Pt. is here for an ER f/u at [facility] yesterday for intestinal pain. Pt. was given pain meds through IV. Pt. states that she was also seen at [facility] and was prescribed Toradol 60 MG which pt. states that it was sent to the wrong pharmacy. Pt. denies any pain today.        Hypertension   This is a chronic problem. The current episode started more than 1 year ago. The problem has been resolved since onset. The problem is controlled. Pertinent negatives include no blurred vision, chest pain, headaches, neck pain, orthopnea, palpitations, PND, shortness of breath or sweats.     Fu dm,gerd.oa       Chronic,stable    Fu dehydration impr      Past Medical History:   Diagnosis Date   ??? Anxiety    ??? Cancer (HCC)     breast   ??? Colon polyp    ??? Hypertension    ??? Osteopenia     last DXA 10/2014   ??? Right knee pain 01/07/2012   ??? 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 Type II or unspecified type diabetes mellitus without mention of complication, not stated as uncontrolled      Patient Active Problem List    Diagnosis Date Noted   ??? Colon polyp 06/25/2015   ??? Iron deficiency anemia 06/25/2015   ??? Transient cerebral ischemia 10/30/2014   ??? Primary osteoarthritis of left knee 10/29/2014   ??? Other and unspecified hyperlipidemia 04/03/2014   ??? Primary osteoarthritis of both knees 01/21/2014   ??? DM type 2 with diabetic dyslipidemia (HCC) 10/10/2013   ??? OA (osteoarthritis) of knee 07/09/2013   ??? Abnormal mammogram with microcalcification 02/02/2013   ??? 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 OA (osteoarthritis) 12/09/2011   ??? Symptomatic menopausal or female climacteric states 07/26/2011   ??? Breast cancer (HCC) 03/01/2011   ??? GERD (gastroesophageal reflux disease) 11/04/2010   ??? Osteopenia 11/04/2010   ??? HIGH CHOLESTEROL 07/16/2010   ??? Type II or unspecified type diabetes mellitus without  mention of complication, not stated as uncontrolled    ??? Hypertension    ??? History of malignant neoplasm of breast 12/29/2005     Past Surgical History:   Procedure Laterality Date   ??? BREAST LUMPECTOMY  2007   ??? CHOLECYSTECTOMY     ??? COLONOSCOPY  09/18/12    [name]   ??? TONSILLECTOMY  age 32     Family History   Problem Relation Age of Onset   ??? High Blood Pressure Mother    ??? High Blood Pressure Father      Social History     Social History   ??? Marital status: Married     Spouse name: N/A   ??? Number of children: N/A   ??? Years of education: N/A     Social History Main Topics   ??? Smoking status: Never Smoker   ??? Smokeless tobacco: Never Used   ??? Alcohol use Yes      Comment: occ wine   ??? Drug use: No   ??? Sexual activity: Not on file     Other Topics Concern   ??? Not on file     Social History Narrative   ??? No narrative on file  Allergies   Allergen Reactions   ??? Morphine    ??? Oxycontin [Oxycodone]    ??? Penicillins Other (See Comments)   ??? Invokana [Canagliflozin]        Review of Systems   Eyes: Negative for blurred vision.   Respiratory: Negative for shortness of breath.    Cardiovascular: Negative for chest pain, palpitations, orthopnea and PND.   Musculoskeletal: Negative for neck pain.   Neurological: Negative for headaches.           Vitals:    06/01/16 1710   BP: 130/60   Site: Right Arm   Position: Sitting   Cuff Size: Medium Adult   Pulse: 88   Resp: 14   Temp: 98.5 ??F (36.9 ??C)   TempSrc: Oral   Weight: 143 lb (64.9 kg)   Height: 5' 6.5" (1.689 m)       Physical Exam       PHYSICAL EXAMINATION:   VITAL SIGNS: are as recorded.  GENERAL:  The patient appears well nourished and well-developed, and with normal affect.  No acute respiratory distress. Alert and oriented times 3. No skin rashes.     HEENT:  TMs normal bilaterally.  Canals and ears normal. Pharynx is clear. Extraocular eye motions intact and pain free.   Pupils reactive and equally round.  Sclerae and conjunctivae clear, normocephalic and  atraumatic.      RESPIRATORY:  Clear and equal breath sounds with no acute respiratory distress.       HEART: Regular rhythm without murmur, rub or gallop.     ABDOMEN:  soft, nontender. No masses, guarding or rebound. Normoactive bowel sounds.     LOW BACK: No tenderness to palpation of inferior lumbar spine or either sacroiliac joint area.       Assessment/Plan  Cherrell was seen today for abdominal pain.    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      colonoscopy next      Health Maintenance Due   Topic Date Due   ??? Diabetic retinal exam  04/09/2014   ??? Diabetic foot exam  03/19/2016       Controlled Substances Monitoring:      Return in about 6 days (around 06/07/2016).    Ignacia Felling, MD

## 2016-06-07 ENCOUNTER — Encounter: Attending: Family Medicine | Primary: Family Medicine

## 2016-06-07 NOTE — Telephone Encounter (Signed)
Called pt regarding no show appt and the husband let me know pt's daughter took her to the ER, she was having very bad stomach pain. Husband states she left about noon.

## 2016-06-09 NOTE — Telephone Encounter (Signed)
Per CB called the patient to check on her and see how she is feeling. Her husband stated she is still in [facility] hospital .

## 2016-06-17 ENCOUNTER — Ambulatory Visit: Admit: 2016-06-17 | Discharge: 2016-06-17 | Payer: MEDICARE | Attending: Family Medicine | Primary: Family Medicine

## 2016-06-17 DIAGNOSIS — R1084 Generalized abdominal pain: Secondary | ICD-10-CM

## 2016-06-17 MED ORDER — LINACLOTIDE 145 MCG PO CAPS
145 MCG | ORAL_CAPSULE | Freq: Every day | ORAL | 0 refills | Status: DC
Start: 2016-06-17 — End: 2016-07-20

## 2016-06-17 NOTE — Progress Notes (Signed)
Subjective:      Patient ID: Lori Chase is a 76 y.o. female who presents today for:  Chief Complaint   Patient presents with   ??? Follow-Up from Hospital     Pt is her efor a hospital fu from 06/07/16 and 1/ 24/18 for abdominal pain. Pt was seen at both the [place] respectively. Colonoscopy was done at [place] and Pt nstated a colon narrowing and blockage. [facility] gave medication , Bentyl 20 mg and Norco 5-325 and is helping with the pain. She is only taking meds when the pain comes since they did not give many.             Follow-Up from Hospital  Patient following up after being in [facility] x 1 week and [facility] hospital last night for severe abdominal pain. Patient had c/o constipation and generalized abdominal pain. Patient had CT of abdomen and pelvis which showed possible small bowel obstruction. Patient had colonoscopy done and had a mass removed that was sent for biopsy to rule out cancer but results are not back yet. Patient still has c/o constipation and states she does not have the urge to have a bowel movement. Patient was given Bentyl for abdominal cramping and Norco to take for pain.      Abdominal Pain   This is a new problem. The current episode started in the past 7 days. The problem occurs daily. The problem has been unchanged. The pain is located in the generalized abdominal region. The pain is at a severity of 7/10. The pain is severe. The quality of the pain is cramping and aching. The abdominal pain does not radiate. Associated symptoms include constipation. Pertinent negatives include no diarrhea, dysuria, frequency, headaches, hematochezia, hematuria or nausea. Nothing aggravates the pain. The pain is relieved by nothing. She has tried nothing for the symptoms. The treatment provided no relief. Prior diagnostic workup includes CT scan.   Hypertension   This is a chronic problem. The current episode started more than 1 year ago. The problem has been gradually improving since onset.  The problem is controlled. Pertinent negatives include no blurred vision, chest pain, headaches or shortness of breath. Risk factors for coronary artery disease include post-menopausal state, diabetes mellitus and dyslipidemia. The current treatment provides moderate improvement.       Past Medical History:   Diagnosis Date   ??? Anxiety    ??? Cancer (HCC)     breast   ??? Colon polyp    ??? Hypertension    ??? Osteopenia     last DXA 10/2014   ??? Right knee pain 01/07/2012   ??? 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 Type II or unspecified type diabetes mellitus without mention of complication, not stated as uncontrolled      Past Surgical History:   Procedure Laterality Date   ??? BREAST LUMPECTOMY  2007   ??? CHOLECYSTECTOMY     ??? COLONOSCOPY  09/18/12    [name]   ??? TONSILLECTOMY  age 27     Family History   Problem Relation Age of Onset   ??? High Blood Pressure Mother    ??? High Blood Pressure Father      Social History     Social History   ??? Marital status: Married     Spouse name: N/A   ??? Number of children: N/A   ??? Years of education: N/A     Occupational History   ???  Not on file.     Social History Main Topics   ??? Smoking status: Never Smoker   ??? Smokeless tobacco: Never Used   ??? Alcohol use Yes      Comment: occ wine   ??? Drug use: No   ??? Sexual activity: Not on file     Other Topics Concern   ??? Not on file     Social History Narrative   ??? No narrative on file     Allergies:  Morphine; Oxycontin [oxycodone]; Penicillins; and Invokana [canagliflozin]    Review of Systems   Eyes: Negative for blurred vision.   Respiratory: Negative for shortness of breath.    Cardiovascular: Negative for chest pain.   Gastrointestinal: Positive for abdominal distention, abdominal pain and constipation. Negative for diarrhea, hematochezia and nausea.   Genitourinary: Negative for dysuria, frequency and hematuria.   Neurological: Negative for headaches.       Objective:   BP 106/62 (Site: Right Arm, Position: Sitting, Cuff Size: Large Adult)     Pulse 79    Temp 97.7 ??F (36.5 ??C) (Oral)    Resp 14    Ht 5' 6.5" (1.689 m)    Wt 138 lb (62.6 kg)    SpO2 98%    BMI 21.94 kg/m??     Physical Exam      PHYSICAL EXAMINATION:   VITAL SIGNS: are as recorded.  GENERAL:  The patient appears well nourished and well-developed, and with normal affect.  No acute respiratory distress. Alert and oriented times 3. No skin rashes.     HEENT:  TMs normal bilaterally.  Canals and ears normal. Pharynx is clear. Extraocular eye motions intact and pain free.   Pupils reactive and equally round.  Sclerae and conjunctivae clear, normocephalic and atraumatic.      RESPIRATORY:  Clear and equal breath sounds with no acute respiratory distress.       HEART: Regular rhythm without murmur, rub or gallop.     ABDOMEN:  soft, nontender. No masses, guarding or rebound. Normoactive bowel sounds.     LOW BACK: No tenderness to palpation of inferior lumbar spine or either sacroiliac joint area.       Assessment:     1. Generalized abdominal pain     2. Essential hypertension     3. Other constipation  linaclotide (LINZESS) 145 MCG capsule   4. DM type 2 with diabetic dyslipidemia ([place])     5. Mass of colon         Plan:      Orders Placed This Encounter   Procedures   ??? Colonoscopy     This order was created through External Result Entry     Orders Placed This Encounter   Medications   ??? linaclotide (LINZESS) 145 MCG capsule     Sig: Take 1 capsule by mouth every morning (before breakfast)     Dispense:  28 capsule     Refill:  0     The current medical regimen is effective;  continue present plan and medications.    Soft diet only    If patient would develop any chest pain, shortness of breath or symptoms related to either that last >15 minutes, patient is to go to the ER.        Await path results    Controlled Substances Monitoring:      Return in about 1 week (around 06/24/2016).    Darden Palmer. Cox, RMA   ,  am scribing for and in the presence of Ignacia Felling, MD. Electronically signed  by :  Colen Darling. Cox, RMA     I, Ignacia Felling, MD, personally performed the services described in this documentation, as scribed by Colen Darling. Cox, RMA    in my presence, and it is both accurate and complete. Electronically signed by: Ignacia Felling, MD    06/27/16 5:03 PM    Ignacia Felling, MD

## 2016-06-18 NOTE — Telephone Encounter (Signed)
[phone] (home)       Called [facility] to set her up for a MyChart account.     No voicemail    Josef Tourigny,Torren Maffeo      Patient Care Team:  Ignacia Felling, MD as PCP - General (Family Medicine)  Ignacia Felling, MD as PCP - [facility] Attributed Provider  Lanier Clam, MD

## 2016-06-24 ENCOUNTER — Encounter: Attending: Family Medicine | Primary: Family Medicine

## 2016-06-24 NOTE — Telephone Encounter (Signed)
Attempted to call patient regarding her missed appointment today. Her husband stated she is in [place] clinic and she had surgery for a bowel obstruction.

## 2016-07-05 NOTE — Telephone Encounter (Signed)
Manuela Schwartz from [id number]    She is called to clarify if pt is supposed to be on all 3 meds. Please call her cell phone. Please advise.    Glimepiride 4mg  BID    Januvia 100mg  once a day    Metformin 500mg  1 tav 4x a day

## 2016-07-05 NOTE — Telephone Encounter (Signed)
Lori Chase is now aware. Thank you.

## 2016-07-05 NOTE — Telephone Encounter (Signed)
Yes on all 3 meds

## 2016-07-15 NOTE — Telephone Encounter (Signed)
Error

## 2016-07-20 ENCOUNTER — Ambulatory Visit: Admit: 2016-07-20 | Discharge: 2016-07-20 | Payer: MEDICARE | Attending: Family Medicine | Primary: Family Medicine

## 2016-07-20 DIAGNOSIS — C182 Malignant neoplasm of ascending colon: Secondary | ICD-10-CM

## 2016-07-20 LAB — POCT GLYCOSYLATED HEMOGLOBIN (HGB A1C): Hemoglobin A1C: 6.2 %

## 2016-07-20 MED ORDER — PAROXETINE HCL 10 MG PO TABS
10 MG | ORAL_TABLET | Freq: Every day | ORAL | 3 refills | Status: DC
Start: 2016-07-20 — End: 2016-08-03

## 2016-07-20 MED ORDER — VALSARTAN 80 MG PO TABS
80 | ORAL_TABLET | ORAL | 1 refills | Status: DC
Start: 2016-07-20 — End: 2016-08-03

## 2016-07-20 MED ORDER — AMLODIPINE BESYLATE 10 MG PO TABS
10 | ORAL_TABLET | ORAL | 1 refills | Status: DC
Start: 2016-07-20 — End: 2016-08-03

## 2016-07-20 MED ORDER — SITAGLIPTIN PHOSPHATE 100 MG PO TABS
100 | ORAL_TABLET | ORAL | 1 refills | Status: DC
Start: 2016-07-20 — End: 2016-08-03

## 2016-07-20 MED ORDER — METFORMIN HCL ER 500 MG PO TB24
500 | ORAL_TABLET | ORAL | 1 refills | Status: DC
Start: 2016-07-20 — End: 2016-08-03

## 2016-07-20 MED ORDER — GLIMEPIRIDE 4 MG PO TABS
4 | ORAL_TABLET | ORAL | 1 refills | Status: DC
Start: 2016-07-20 — End: 2017-05-31

## 2016-07-20 MED ORDER — ALENDRONATE SODIUM 70 MG PO TABS
70 | ORAL_TABLET | ORAL | 1 refills | Status: DC
Start: 2016-07-20 — End: 2017-02-25

## 2016-07-20 MED ORDER — ATORVASTATIN CALCIUM 10 MG PO TABS
10 | ORAL_TABLET | ORAL | 1 refills | Status: DC
Start: 2016-07-20 — End: 2016-11-16

## 2016-07-20 NOTE — Progress Notes (Signed)
Subjective:      Patient ID: Lori Chase is a 76 y.o. female who presents today for:  Chief Complaint   Patient presents with   ??? Post-Op Check     patient is following up after having surgery on 06/20/16 for small bowel obstruction. patient states they removed  2 inches of intenstines. patient reports no constipation or abdominal pain.        HPI    Post Op Check  Patient is here for a follow up after partial colectomy. She is not having any abdominal pain or constipation. She had 2 inches of her colon removed.    Fu htn,dm,lipids     Chronic,stable    Past Medical History:   Diagnosis Date   ??? Anxiety    ??? Cancer (HCC)     breast   ??? Colon polyp    ??? Hypertension    ??? Osteopenia     last DXA 10/2014   ??? Right knee pain 01/07/2012   ??? 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 Type II or unspecified type diabetes mellitus without mention of complication, not stated as uncontrolled      Past Surgical History:   Procedure Laterality Date   ??? BREAST LUMPECTOMY  2007   ??? CHOLECYSTECTOMY     ??? COLONOSCOPY  09/18/12    [name]   ??? TONSILLECTOMY  age 42     Family History   Problem Relation Age of Onset   ??? High Blood Pressure Mother    ??? High Blood Pressure Father      Social History     Social History   ??? Marital status: Married     Spouse name: N/A   ??? Number of children: N/A   ??? Years of education: N/A     Occupational History   ??? Not on file.     Social History Main Topics   ??? Smoking status: Never Smoker   ??? Smokeless tobacco: Never Used   ??? Alcohol use Yes      Comment: occ wine   ??? Drug use: No   ??? Sexual activity: Not on file     Other Topics Concern   ??? Not on file     Social History Narrative   ??? No narrative on file     Allergies:  Morphine; Oxycontin [oxycodone]; Penicillins; and Invokana [canagliflozin]    Review of Systems   Constitutional: Negative for activity change, appetite change, chills, diaphoresis, fatigue, fever and unexpected weight change.   Eyes: Negative for discharge.   Respiratory:  Negative for apnea and chest tightness.    Cardiovascular: Negative for chest pain, palpitations and leg swelling.   Gastrointestinal: Negative for abdominal pain, constipation, diarrhea, nausea and vomiting.   Musculoskeletal: Negative for arthralgias.   Neurological: Negative for dizziness, light-headedness and headaches.       Objective:   BP 124/68    Pulse 79    Temp 98 ??F (36.7 ??C) (Tympanic)    Resp 14    Ht 5' 6.5" (1.689 m)    Wt 127 lb (57.6 kg)    SpO2 98%    BMI 20.19 kg/m??     Physical Exam      PHYSICAL EXAMINATION:   VITAL SIGNS: are as recorded.  GENERAL:  The patient appears well nourished and well-developed, and with normal affect.  No acute respiratory distress. Alert and oriented times 3. No skin rashes.     HEENT:  TMs normal bilaterally.  Canals and ears normal. Pharynx is clear. Extraocular eye motions intact and pain free.   Pupils reactive and equally round.  Sclerae and conjunctivae clear, normocephalic and atraumatic.      RESPIRATORY:  Clear and equal breath sounds with no acute respiratory distress.       HEART: Regular rhythm without murmur, rub or gallop.     ABDOMEN:  soft, nontender. No masses, guarding or rebound. Normoactive bowel sounds.     NECK: No masses or adenopathy palpable.  No bruits heard. No asymmetry visible.       LOW BACK: No tenderness to palpation of inferior lumbar spine or either sacroiliac joint area.       Assessment:     1. 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 Essential hypertension  valsartan (DIOVAN) 80 MG tablet    amLODIPine (NORVASC) 10 MG tablet   3. Osteoporosis, unspecified osteoporosis type, unspecified pathological fracture presence  alendronate (FOSAMAX) 70 MG tablet   4. Osteopenia, unspecified location  alendronate (FOSAMAX) 70 MG tablet   5. DM type 2 with diabetic dyslipidemia (HCC)  atorvastatin (LIPITOR) 10 MG tablet    metFORMIN (GLUCOPHAGE-XR) 500 MG extended release tablet    glimepiride (AMARYL) 4 MG tablet    SITagliptin  (JANUVIA) 100 MG tablet    POCT glycosylated hemoglobin (Hb A1C)   6. Status post partial colectomy     7. Stress  PARoxetine (PAXIL) 10 MG tablet       Plan:      Orders Placed This Encounter   Procedures   ??? POCT glycosylated hemoglobin (Hb A1C)     Orders Placed This Encounter   Medications   ??? valsartan (DIOVAN) 80 MG tablet     Sig: TAKE 1 TABLET BY MOUTH DAILY     Dispense:  90 tablet     Refill:  1   ??? amLODIPine (NORVASC) 10 MG tablet     Sig: TAKE 1 TABLET BY MOUTH EVERY DAY.     Dispense:  90 tablet     Refill:  1   ??? alendronate (FOSAMAX) 70 MG tablet     Sig: TAKE 1 TABLET BY MOUTH ONCE A WEEK     Dispense:  12 tablet     Refill:  1   ??? atorvastatin (LIPITOR) 10 MG tablet     Sig: TAKE 1 TABLET BY MOUTH EVERY DAY.     Dispense:  90 tablet     Refill:  1   ??? metFORMIN (GLUCOPHAGE-XR) 500 MG extended release tablet     Sig: TAKE 1 TABLET BY MOUTH 4 TIMES A DAY     Dispense:  360 tablet     Refill:  1   ??? glimepiride (AMARYL) 4 MG tablet     Sig: TAKE 2 TABLETS DAILY     Dispense:  180 tablet     Refill:  1   ??? SITagliptin (JANUVIA) 100 MG tablet     Sig: TAKE 1 TABLET BY MOUTH EVERY DAY.     Dispense:  90 tablet     Refill:  1   ??? PARoxetine (PAXIL) 10 MG tablet     Sig: Take 1 tablet by mouth daily     Dispense:  30 tablet     Refill:  3       Controlled Substances Monitoring:      Return in about 2 days (around 07/22/2016).    I, Jolayne Haines  Albright RMA, am scribing for and in the presence of Ignacia Felling, MD. Electronically signed by :  Dorathy Daft RMA    I, Ignacia Felling, MD, personally performed the services described in this documentation, as scribed by Dorathy Daft RMA   in my presence, and it is both accurate and complete. Electronically signed by: Ignacia Felling, MD    07/20/16 5:09 PM    Ignacia Felling, MD

## 2016-08-03 ENCOUNTER — Encounter

## 2016-08-03 ENCOUNTER — Ambulatory Visit: Admit: 2016-08-03 | Discharge: 2016-08-03 | Payer: MEDICARE | Attending: Family Medicine | Primary: Family Medicine

## 2016-08-03 DIAGNOSIS — E785 Hyperlipidemia, unspecified: Secondary | ICD-10-CM

## 2016-08-03 NOTE — Progress Notes (Signed)
Subjective:      Patient ID: Lori Chase is a 76 y.o. female who presents today for:  Chief Complaint   Patient presents with   ??? Diabetes     patient is following up for chronic DM. patient states she stopped taking metformin 500 mg 4x a day and is now only taking twice daily. patients last A1C was 6.2 patient c/o blurred vision x 2 months and is going to see her eye  doctor tomorow. patient reports no chest pain, sob, dizziness, or lightheadedness.    ??? Discuss Medications     patient would like to adjust medications.        Diabetes   She presents for her follow-up diabetic visit. She has type 2 diabetes mellitus. Her disease course has been stable. There are no hypoglycemic associated symptoms. Pertinent negatives for hypoglycemia include no dizziness or headaches. Pertinent negatives for diabetes include no blurred vision, no chest pain, no fatigue, no foot paresthesias, no foot ulcerations, no polydipsia, no polyphagia, no polyuria, no visual change, no weakness and no weight loss. There are no hypoglycemic complications. Risk factors for coronary artery disease include diabetes mellitus, hypertension and post-menopausal. Current diabetic treatment includes oral agent (dual therapy). She is compliant with treatment all of the time. Her weight is stable. When asked about meal planning, she reported none. She never participates in exercise. Her overall blood glucose range is 140-180 mg/dl. She does not see a podiatrist.Eye exam is current.       Past Medical History:   Diagnosis Date   ??? Anxiety    ??? Cancer (HCC)     breast   ??? Colon polyp    ??? Hypertension    ??? Osteopenia     last DXA 10/2014   ??? Right knee pain 01/07/2012   ??? 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 Type II or unspecified type diabetes mellitus without mention of complication, not stated as uncontrolled      Past Surgical History:   Procedure Laterality Date   ??? BREAST LUMPECTOMY  2007   ??? CHOLECYSTECTOMY     ??? COLONOSCOPY  09/18/12    DR  RAZACK   ??? TONSILLECTOMY  age 51     Family History   Problem Relation Age of Onset   ??? High Blood Pressure Mother    ??? High Blood Pressure Father      Social History     Social History   ??? Marital status: Married     Spouse name: N/A   ??? Number of children: N/A   ??? Years of education: N/A     Occupational History   ??? Not on file.     Social History Main Topics   ??? Smoking status: Never Smoker   ??? Smokeless tobacco: Never Used   ??? Alcohol use Yes      Comment: occ wine   ??? Drug use: No   ??? Sexual activity: Not on file     Other Topics Concern   ??? Not on file     Social History Narrative   ??? No narrative on file     Allergies:  Morphine; Oxycontin [oxycodone]; Penicillins; and Invokana [canagliflozin]    Review of Systems   Constitutional: Negative for activity change, appetite change, chills, diaphoresis, fatigue, fever, unexpected weight change and weight loss.   Eyes: Negative for blurred vision.   Respiratory: Negative for shortness of breath and wheezing.    Cardiovascular: Negative for  chest pain, palpitations and leg swelling.   Gastrointestinal: Negative for abdominal pain.   Endocrine: Negative for polydipsia, polyphagia and polyuria.   Neurological: Negative for dizziness, weakness, light-headedness and headaches.       Objective:   BP 128/70    Pulse 90    Temp 98.9 ??F (37.2 ??C) (Tympanic)    Resp 16    Ht 5' 6.5" (1.689 m)    Wt 130 lb (59 kg)    SpO2 98%    BMI 20.67 kg/m??     Physical Exam      PHYSICAL EXAMINATION:   VITAL SIGNS: are as recorded.  GENERAL:  The patient appears well nourished and well-developed, and with normal affect.  No acute respiratory distress. Alert and oriented times 3. No skin rashes.     HEENT:  TMs normal bilaterally.  Canals and ears normal. Pharynx is clear. Extraocular eye motions intact and pain free.   Pupils reactive and equally round.  Sclerae and conjunctivae clear, normocephalic and atraumatic.      RESPIRATORY:  Clear and equal breath sounds with no acute  respiratory distress.       HEART: Regular rhythm without murmur, rub or gallop.     ABDOMEN:  soft, nontender. No masses, guarding or rebound. Normoactive bowel sounds.     NECK: No masses or adenopathy palpable.  No bruits heard. No asymmetry visible.     COMPLETE EXTREMITIES: No edema, all four extremities with posterior tibial and radial pulses strong and palpable.     EXTREMITIES:  no edema in any extremity. No cyanosis or clubbing.    LOW BACK: No tenderness to palpation of inferior lumbar spine or either sacroiliac joint area.       Assessment:     1. DM type 2 with diabetic dyslipidemia ([place])  Comprehensive Metabolic Panel   2. Primary osteoarthritis of both knees     3. Malignant neoplasm of ascending colon ([place])     4. Status post partial colectomy     5. Iron deficiency anemia, unspecified iron deficiency anemia type     6. Hyperlipidemia, unspecified hyperlipidemia type  Lipid Panel   7. Fatigue, unspecified type  CBC Auto Differential    TSH without Reflex   8. Vitamin D deficiency  Vitamin D 25 Hydroxy       Plan:      Orders Placed This Encounter   Procedures   ??? CBC Auto Differential     Standing Status:   Future     Number of Occurrences:   1     Standing Expiration Date:   08/03/2017   ??? Comprehensive Metabolic Panel     Standing Status:   Future     Number of Occurrences:   1     Standing Expiration Date:   08/03/2017   ??? Lipid Panel     Standing Status:   Future     Number of Occurrences:   1     Standing Expiration Date:   08/03/2017     Order Specific Question:   Is Patient Fasting?/# of Hours     Answer:   0   ??? Vitamin D 25 Hydroxy     Standing Status:   Future     Number of Occurrences:   1     Standing Expiration Date:   08/03/2017   ??? TSH without Reflex     Standing Status:   Future     Number of Occurrences:  1     Standing Expiration Date:   08/03/2017     No orders of the defined types were placed in this encounter.    See labs 3/13      Stable    Vit d next    Controlled Substances  Monitoring:      Return in about 2 weeks (around 08/17/2016).    I, Dorathy Daft RMA, am scribing for and in the presence of Ignacia Felling, MD. Electronically signed by :  Dorathy Daft RMA    I, Ignacia Felling, MD, personally performed the services described in this documentation, as scribed by Dorathy Daft RMA   in my presence, and it is both accurate and complete. Electronically signed by: Ignacia Felling, MD    08/04/16 8:48 AM    Ignacia Felling, MD

## 2016-08-04 LAB — LIPID PANEL
Cholesterol, Total: 112 mg/dL (ref 0–199)
HDL: 47 mg/dL (ref 40–59)
LDL Calculated: 34 mg/dL (ref 0–129)
Triglycerides: 157 mg/dL (ref 0–200)

## 2016-08-04 LAB — COMPREHENSIVE METABOLIC PANEL
ALT: 30 U/L (ref 0–33)
AST: 20 U/L (ref 0–35)
Albumin: 3.8 g/dL — ABNORMAL LOW (ref 3.9–4.9)
Alkaline Phosphatase: 56 U/L (ref 40–130)
Anion Gap: 13 mEq/L (ref 7–13)
BUN: 12 mg/dL (ref 8–23)
CO2: 25 mEq/L (ref 22–29)
Calcium: 9.4 mg/dL (ref 8.6–10.2)
Chloride: 100 mEq/L (ref 98–107)
Creatinine: 0.43 mg/dL — ABNORMAL LOW (ref 0.50–0.90)
GFR African American: >60 (ref >60)
GFR Non-African American: >60 (ref >60)
Globulin: 3 g/dL (ref 2.3–3.5)
Glucose: 144 mg/dL — ABNORMAL HIGH (ref 74–109)
Potassium: 5.2 mEq/L — ABNORMAL HIGH (ref 3.5–5.1)
Sodium: 138 mEq/L (ref 132–144)
Total Bilirubin: 0.2 mg/dL (ref 0.0–1.2)
Total Protein: 6.8 g/dL (ref 6.4–8.1)

## 2016-08-04 LAB — CBC WITH AUTO DIFFERENTIAL
Basophils %: 1 %
Basophils Absolute: 0.1 10*3/uL (ref 0.0–0.2)
Eosinophils %: 1.5 %
Eosinophils Absolute: 0 10*3/uL (ref 0.0–0.7)
Hematocrit: 33.3 % — ABNORMAL LOW (ref 37.0–47.0)
Hemoglobin: 10.8 g/dL — ABNORMAL LOW (ref 12.0–16.0)
Lymphocytes %: 23 %
Lymphocytes Absolute: 2.3 10*3/uL (ref 1.0–4.8)
MCH: 25.1 pg — ABNORMAL LOW (ref 27.0–31.3)
MCHC: 32.5 % — ABNORMAL LOW (ref 33.0–37.0)
MCV: 77.1 fL — ABNORMAL LOW (ref 82.0–100.0)
Monocytes %: 5.6 %
Monocytes Absolute: 0.6 10*3/uL (ref 0.2–0.8)
Neutrophils %: 70 %
Neutrophils Absolute: 6.9 10*3/uL — ABNORMAL HIGH (ref 1.4–6.5)
Platelets: 293 10*3/uL (ref 130–400)
RBC: 4.32 M/uL (ref 4.20–5.40)
RDW: 32.4 % — ABNORMAL HIGH (ref 11.5–14.5)
WBC: 9.9 10*3/uL (ref 4.8–10.8)

## 2016-08-04 LAB — TSH: TSH: 0.414 u[IU]/mL (ref 0.270–4.200)

## 2016-08-04 LAB — VITAMIN D 25 HYDROXY: Vit D, 25-Hydroxy: 21.3 ng/mL — ABNORMAL LOW (ref 30.0–100.0)

## 2016-08-17 ENCOUNTER — Ambulatory Visit: Admit: 2016-08-17 | Discharge: 2016-08-17 | Payer: MEDICARE | Attending: Family Medicine | Primary: Family Medicine

## 2016-08-17 DIAGNOSIS — E785 Hyperlipidemia, unspecified: Secondary | ICD-10-CM

## 2016-08-17 MED ORDER — VITAMIN D 50 MCG (2000 UT) PO TABS
50 MCG (2000 UT) | ORAL_TABLET | Freq: Every day | ORAL | 1 refills | Status: DC
Start: 2016-08-17 — End: 2017-02-25

## 2016-08-17 NOTE — Progress Notes (Signed)
Subjective:      Patient ID: Lori Chase is a 76 y.o. female who presents today for:  Chief Complaint   Patient presents with   . Diabetes     patient is following up for chronic DM. patient reports her glucose has stayed between 84-108. patient is taking metformin and amaryl. patient denies chest pain, sob, dizziness, lightheadedness, headaches, leg swelling, sweats, or tremors.        Diabetes   She presents for her follow-up diabetic visit. She has type 2 diabetes mellitus. Pertinent negatives for hypoglycemia include no headaches or sweats. Pertinent negatives for diabetes include no blurred vision, no chest pain, no fatigue, no foot paresthesias, no foot ulcerations, no polydipsia, no polyphagia, no polyuria, no visual change, no weakness and no weight loss. Current diabetic treatments: metformin, glimepiride. She is compliant with treatment all of the time. Her weight is fluctuating minimally. Her overall blood glucose range is 90-110 mg/dl.   Hypertension   This is a chronic problem. The current episode started more than 1 year ago. The problem is controlled. Pertinent negatives include no anxiety, blurred vision, chest pain, headaches, malaise/fatigue, neck pain, orthopnea, palpitations, peripheral edema, PND, shortness of breath or sweats. Treatments tried: amlodipine. The current treatment provides moderate improvement.       Past Medical History:   Diagnosis Date   . Anxiety    . Cancer [facility])     breast   . Colon polyp    . Hypertension    . Osteopenia     last DXA 10/2014   . Right knee pain 01/07/2012   . 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 Type II or unspecified type diabetes mellitus without mention of complication, not stated as uncontrolled      Past Surgical History:   Procedure Laterality Date   . BREAST LUMPECTOMY  2007   . CHOLECYSTECTOMY     . COLONOSCOPY  09/18/12    [name]   . TONSILLECTOMY  age 31     Family History   Problem Relation Age of Onset   . High Blood Pressure Mother     . High Blood Pressure Father      Social History     Social History   . Marital status: Married     Spouse name: N/A   . Number of children: N/A   . Years of education: N/A     Occupational History   . Not on file.     Social History Main Topics   . Smoking status: Never Smoker   . Smokeless tobacco: Never Used   . Alcohol use Yes      Comment: occ wine   . Drug use: No   . Sexual activity: Not on file     Other Topics Concern   . Not on file     Social History Narrative   . No narrative on file     Allergies:  Morphine; Oxycontin [oxycodone]; Penicillins; and Invokana [canagliflozin]    Review of Systems   Constitutional: Negative for fatigue, malaise/fatigue and weight loss.   Eyes: Negative for blurred vision.   Respiratory: Negative for shortness of breath.    Cardiovascular: Negative for chest pain, palpitations, orthopnea and PND.   Endocrine: Negative for polydipsia, polyphagia and polyuria.   Musculoskeletal: Negative for neck pain.   Neurological: Negative for weakness and headaches.       Objective:   BP 128/64   Pulse 96  Temp 98.3 F (36.8 C) (Tympanic)   Resp 14   Ht 5' 6.5" (1.689 m)   Wt 133 lb (60.3 kg)   SpO2 96%   BMI 21.15 kg/m     Physical Exam      PHYSICAL EXAMINATION:   VITAL SIGNS: are as recorded.  GENERAL:  The patient appears well nourished and well-developed, and with normal affect.  No acute respiratory distress. Alert and oriented times 3. No skin rashes.     HEENT:  TMs normal bilaterally.  Canals and ears normal. Pharynx is clear. Extraocular eye motions intact and pain free.   Pupils reactive and equally round.  Sclerae and conjunctivae clear, normocephalic and atraumatic.      RESPIRATORY:  Clear and equal breath sounds with no acute respiratory distress.       HEART: Regular rhythm without murmur, rub or gallop.     ABDOMEN:  soft, nontender. No masses, guarding or rebound. Normoactive bowel sounds.     LOW BACK: No tenderness to palpation of inferior lumbar spine or  either sacroiliac joint area.       Assessment:     1. DM type 2 with diabetic dyslipidemia (HCC)     2. Essential hypertension     3. Primary osteoarthritis of left knee     4. Gastroesophageal reflux disease with esophagitis     5. Malignant neoplasm of ascending colon (HCC)     6. Status post partial colectomy     7. Vitamin D deficiency  Cholecalciferol (VITAMIN D) 2000 units TABS tablet       Plan:      No orders of the defined types were placed in this encounter.    Orders Placed This Encounter   Medications   . Cholecalciferol (VITAMIN D) 2000 units TABS tablet     Sig: Take 1 tablet by mouth daily     Dispense:  90 tablet     Refill:  1     CMP, CBC, Magnesium next    The current medical regimen is effective;  continue present plan and medications.      Controlled Substances Monitoring:      Return in about 3 weeks (around 09/07/2016).    I, Vickie Epley, RMA  , am scribing for and in the presence of Kerry Kass, MD. Electronically signed by :  Vickie Epley, RMA    I, Kerry Kass, MD, personally performed the services described in this documentation, as scribed by Vickie Epley, RMA   in my presence, and it is both accurate and complete. Electronically signed by: Kerry Kass, MD    08/17/16 4:43 PM    Kerry Kass, MD

## 2016-09-07 ENCOUNTER — Ambulatory Visit: Admit: 2016-09-07 | Discharge: 2016-09-07 | Payer: MEDICARE | Attending: Family Medicine | Primary: Family Medicine

## 2016-09-07 DIAGNOSIS — C182 Malignant neoplasm of ascending colon: Secondary | ICD-10-CM

## 2016-09-07 MED ORDER — PANTOPRAZOLE SODIUM 40 MG PO TBEC
40 | ORAL_TABLET | Freq: Every day | ORAL | 1 refills | Status: DC
Start: 2016-09-07 — End: 2016-11-16

## 2016-09-07 MED ORDER — PANTOPRAZOLE SODIUM 40 MG PO TBEC
40 MG | ORAL_TABLET | Freq: Every day | ORAL | 0 refills | Status: DC
Start: 2016-09-07 — End: 2016-09-17

## 2016-09-07 NOTE — Progress Notes (Signed)
Subjective:      Patient ID: Lori Chase is a 76 y.o. female who presents today for:  Chief Complaint   Patient presents with   ??? Diabetes     Pt. is here for a f/u on chronic DM. Pt. is currently taking Metformin and Amaryl which are working well for her. Pt. checked her sugar today and it was 126. Pts. last A1C was done on 07/20/2016 and was 6.2.        Diabetes   She presents for her follow-up diabetic visit. She has type 2 diabetes mellitus. There are no hypoglycemic associated symptoms. Pertinent negatives for hypoglycemia include no dizziness. Pertinent negatives for diabetes include no blurred vision, no chest pain, no fatigue, no foot paresthesias, no foot ulcerations, no polydipsia, no polyphagia, no polyuria, no visual change, no weakness and no weight loss. Risk factors for coronary artery disease include diabetes mellitus, hypertension and post-menopausal. Current diabetic treatment includes oral agent (dual therapy). She is compliant with treatment all of the time. She never participates in exercise. There is no change in her home blood glucose trend. Her overall blood glucose range is 110-130 mg/dl. She does not see a podiatrist.Eye exam is current.     Fu anemia,htn,oa      Chronic,stable    Past Medical History:   Diagnosis Date   ??? Anxiety    ??? Cancer (HCC)     breast   ??? Colon polyp    ??? Hypertension    ??? Osteopenia     last DXA 10/2014   ??? Right knee pain 01/07/2012   ??? 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 Type II or unspecified type diabetes mellitus without mention of complication, not stated as uncontrolled      Past Surgical History:   Procedure Laterality Date   ??? BREAST LUMPECTOMY  2007   ??? CHOLECYSTECTOMY     ??? COLONOSCOPY  09/18/12    [name]   ??? TONSILLECTOMY  age 33     Family History   Problem Relation Age of Onset   ??? High Blood Pressure Mother    ??? High Blood Pressure Father      Social History     Social History   ??? Marital status: Married     Spouse name: N/A   ??? Number of  children: N/A   ??? Years of education: N/A     Occupational History   ??? Not on file.     Social History Main Topics   ??? Smoking status: Never Smoker   ??? Smokeless tobacco: Never Used   ??? Alcohol use Yes      Comment: occ wine   ??? Drug use: No   ??? Sexual activity: Not on file     Other Topics Concern   ??? Not on file     Social History Narrative   ??? No narrative on file     Allergies:  Morphine; Oxycontin [oxycodone]; Penicillins; and Invokana [canagliflozin]    Review of Systems   Constitutional: Negative for activity change, appetite change, chills, diaphoresis, fatigue, fever, unexpected weight change and weight loss.   Eyes: Negative for blurred vision.   Respiratory: Negative for shortness of breath and wheezing.    Cardiovascular: Negative for chest pain, palpitations and leg swelling.   Gastrointestinal: Negative for abdominal pain.   Endocrine: Negative for polydipsia, polyphagia and polyuria.   Neurological: Positive for numbness. Negative for dizziness, weakness and light-headedness.  Objective:   BP 120/70 (Site: Left Arm, Position: Sitting, Cuff Size: Medium Adult)    Pulse 75    Temp 97.1 ??F (36.2 ??C) (Tympanic)    Resp 16    Ht 5' 6.5" (1.689 m)    Wt 134 lb (60.8 kg)    SpO2 98%    Breastfeeding? No    BMI 21.30 kg/m??     Physical Exam      PHYSICAL EXAMINATION:   VITAL SIGNS: are as recorded.  GENERAL:  The patient appears well nourished and well-developed, and with normal affect.  No acute respiratory distress. Alert and oriented times 3. No skin rashes.     HEENT:  TMs normal bilaterally.  Canals and ears normal. Pharynx is clear. Extraocular eye motions intact and pain free.   Pupils reactive and equally round.  Sclerae and conjunctivae clear, normocephalic and atraumatic.      RESPIRATORY:  Clear and equal breath sounds with no acute respiratory distress.       HEART: Regular rhythm without murmur, rub or gallop.     ABDOMEN:  soft, nontender. No masses, guarding or rebound. Normoactive bowel  sounds.     NECK: No masses or adenopathy palpable.  No bruits heard. No asymmetry visible.     COMPLETE EXTREMITIES: No edema, all four extremities with posterior tibial and radial pulses strong and palpable.     EXTREMITIES:  no edema in any extremity. No cyanosis or clubbing.    LOW BACK: No tenderness to palpation of inferior lumbar spine or either sacroiliac joint area.       Assessment:      Diagnosis Orders   1. Malignant neoplasm of ascending colon ([place])     2. Iron deficiency anemia, unspecified iron deficiency anemia type     3. Primary osteoarthritis of left knee     4. Essential hypertension     5. DM type 2 with diabetic dyslipidemia ([place])         Plan:      No orders of the defined types were placed in this encounter.    Orders Placed This Encounter   Medications   ??? pantoprazole (PROTONIX) 40 MG tablet     Sig: Take 1 tablet by mouth daily     Dispense:  90 tablet     Refill:  1   ??? pantoprazole (PROTONIX) 40 MG tablet     Sig: Take 1 tablet by mouth daily     Dispense:  14 tablet     Refill:  0     Mag next    Controlled Substances Monitoring:      Return in about 10 days (around 09/17/2016).    I, Dorathy Daft RMA, am scribing for and in the presence of Ignacia Felling, MD. Electronically signed by :  Dorathy Daft RMA    I, Ignacia Felling, MD, personally performed the services described in this documentation, as scribed by Dorathy Daft RMA   in my presence, and it is both accurate and complete. Electronically signed by: Ignacia Felling, MD    09/14/16 9:28 AM    Ignacia Felling, MD

## 2016-09-17 ENCOUNTER — Ambulatory Visit: Admit: 2016-09-17 | Discharge: 2016-09-17 | Payer: MEDICARE | Attending: Family Medicine | Primary: Family Medicine

## 2016-09-17 DIAGNOSIS — C182 Malignant neoplasm of ascending colon: Secondary | ICD-10-CM

## 2016-09-17 NOTE — Progress Notes (Signed)
Subjective:      Patient ID: Lori Chase is a 76 y.o. female who presents today for:  Chief Complaint   Patient presents with   . Check-Up     Pt is here for a 10 day FU on neoplasm of colon and pre-visit before surgery. Pt has done PAT with [facility] on  09/20/16.       HPI    Check-Up  Pt is here today for a 10 day follow-up on a neoplasm of her colon & a general check-up prior to surgery. She had PAT done at the [facility] on 09/06/16.    She is having the surgery done with Dr. Marchia Bond ([place]) on 09/20/16.    Fu htn,dm     Chronic,stable    Past Medical History:   Diagnosis Date   . Anxiety    . Cancer [facility])     breast   . Colon polyp    . Hypertension    . Osteopenia     last DXA 10/2014   . Right knee pain 01/07/2012   . 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 Type II or unspecified type diabetes mellitus without mention of complication, not stated as uncontrolled      Past Surgical History:   Procedure Laterality Date   . BREAST LUMPECTOMY  2007   . CHOLECYSTECTOMY     . COLONOSCOPY  09/18/12    [name]   . TONSILLECTOMY  age 34     Family History   Problem Relation Age of Onset   . High Blood Pressure Mother    . High Blood Pressure Father      Social History     Social History   . Marital status: Married     Spouse name: N/A   . Number of children: N/A   . Years of education: N/A     Occupational History   . Not on file.     Social History Main Topics   . Smoking status: Never Smoker   . Smokeless tobacco: Never Used   . Alcohol use Yes      Comment: occ wine   . Drug use: No   . Sexual activity: Not on file     Other Topics Concern   . Not on file     Social History Narrative   . No narrative on file     Allergies:  Morphine; Oxycontin [oxycodone]; Penicillins; and Invokana [canagliflozin]    Review of Systems   Respiratory: Negative for shortness of breath and wheezing.    Cardiovascular: Negative for palpitations and leg swelling.   Gastrointestinal: Negative for abdominal pain.   Neurological:  Negative for dizziness, light-headedness and headaches.       Objective:   BP 128/80 (Site: Right Arm, Position: Sitting, Cuff Size: Large Adult)   Pulse 71   Temp 97.6 F (36.4 C) (Tympanic)   Resp 16   Ht 5' 6.5" (1.689 m)   Wt 135 lb 9.6 oz (61.5 kg)   SpO2 99%   BMI 21.56 kg/m     Physical Exam      PHYSICAL EXAMINATION:   VITAL SIGNS: are as recorded.  GENERAL:  The patient appears well nourished and well-developed, and with normal affect.  No acute respiratory distress. Alert and oriented times 3. No skin rashes.     HEENT:  TMs normal bilaterally.  Canals and ears normal. Pharynx is clear. Extraocular eye motions intact and pain free.  Pupils reactive and equally round.  Sclerae and conjunctivae clear, normocephalic and atraumatic.      RESPIRATORY:  Clear and equal breath sounds with no acute respiratory distress.       HEART: Regular rhythm without murmur, rub or gallop.     ABDOMEN:  soft, nontender. No masses, guarding or rebound. Normoactive bowel sounds.     LOW BACK: No tenderness to palpation of inferior lumbar spine or either sacroiliac joint area.       Assessment:      Diagnosis Orders   1. Malignant neoplasm of ascending colon ([place])     2. Mass of hepatic flexure of colon     3. Mass of colon     4. Status post partial colectomy     5. Essential hypertension     6. Gastroesophageal reflux disease with esophagitis     7. DM type 2 with diabetic dyslipidemia ([place])         Plan:      No orders of the defined types were placed in this encounter.    No orders of the defined types were placed in this encounter.      Controlled Substances Monitoring:      Return in about 2 weeks (around 10/01/2016).    I, Domenic Schwab, RMA  , am scribing for and in the presence of Ignacia Felling, MD. Electronically signed by :  Domenic Schwab, RMA    I, Ignacia Felling, MD, personally performed the services described in this documentation, as scribed by Domenic Schwab, RMA   in my presence, and it is both  accurate and complete. Electronically signed by: Ignacia Felling, MD    09/17/16 5:03 PM    Ignacia Felling, MD

## 2016-10-01 ENCOUNTER — Encounter: Attending: Family Medicine | Primary: Family Medicine

## 2016-10-26 ENCOUNTER — Encounter: Attending: Family Medicine | Primary: Family Medicine

## 2016-10-29 ENCOUNTER — Ambulatory Visit: Admit: 2016-10-29 | Discharge: 2016-10-29 | Payer: MEDICARE | Attending: Family Medicine | Primary: Family Medicine

## 2016-10-29 ENCOUNTER — Encounter

## 2016-10-29 DIAGNOSIS — E785 Hyperlipidemia, unspecified: Secondary | ICD-10-CM

## 2016-10-29 MED ORDER — GLUCOSE BLOOD VI STRP
3 refills | Status: DC
Start: 2016-10-29 — End: 2017-02-25

## 2016-10-29 NOTE — Progress Notes (Signed)
Subjective:      Patient ID: Lori Chase is a 76 y.o. female who presents today for:  Chief Complaint   Patient presents with   . Diabetes     Patient is here for her diabetes and had to reduce Metformin due to glucose was down in the 70s and reduced by two pills with blood sugar at 129.    Marland Kitchen Post-op Problem     Patient is to follow up from reversal of ostomy done 09/20/16 and she is healing well. She was concerned about her weight since she has not been eating much. She is right now  3 lbs reduced from last visit.        Diabetes   She presents for her follow-up diabetic visit. She has type 2 diabetes mellitus. Her disease course has been stable. Pertinent negatives for diabetes include no blurred vision, no chest pain, no fatigue, no foot paresthesias, no foot ulcerations, no polydipsia, no polyphagia, no polyuria, no visual change, no weakness and no weight loss. Current diabetic treatments: metformin, glimepiride. She is compliant with treatment all of the time. Her weight is stable. She is following a generally healthy diet.     Post-op Problem  Pt is here today for a follow-up from a reversal of an ostomy she had done 09/20/16. States that she is healing well. She is down -3 lbs from last visit. She was advised to not worry about her weight & to follow a well balanced diet. States that she has a follow-up with the surgeon on 11/19/16.    Past Medical History:   Diagnosis Date   . Anxiety    . Cancer [facility])     breast   . Colon polyp    . Hypertension    . Osteopenia     last DXA 10/2014   . Right knee pain 01/07/2012   . 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 Type II or unspecified type diabetes mellitus without mention of complication, not stated as uncontrolled      Past Surgical History:   Procedure Laterality Date   . BREAST LUMPECTOMY  2007   . CHOLECYSTECTOMY     . COLONOSCOPY  09/18/12    [name]   . TONSILLECTOMY  age 71     Family History   Problem Relation Age of Onset   . High Blood Pressure  Mother    . High Blood Pressure Father      Social History     Social History   . Marital status: Married     Spouse name: N/A   . Number of children: N/A   . Years of education: N/A     Occupational History   . Not on file.     Social History Main Topics   . Smoking status: Never Smoker   . Smokeless tobacco: Never Used   . Alcohol use Yes      Comment: occ wine   . Drug use: No   . Sexual activity: Not on file     Other Topics Concern   . Not on file     Social History Narrative   . No narrative on file     Allergies:  Morphine; Oxycontin [oxycodone]; Penicillins; and Invokana [canagliflozin]    Review of Systems   Constitutional: Negative for fatigue and weight loss.   Eyes: Negative for blurred vision.   Cardiovascular: Negative for chest pain.   Endocrine: Negative for polydipsia, polyphagia and  polyuria.   Neurological: Negative for weakness.       Objective:   BP 132/72 (Site: Right Arm, Position: Sitting, Cuff Size: Medium Adult)   Pulse 78   Temp 97.3 F (36.3 C) (Tympanic)   Resp 14   Ht 5' 6.5" (1.689 m)   Wt 132 lb 14.4 oz (60.3 kg)   SpO2 98%   BMI 21.13 kg/m     Physical Exam      PHYSICAL EXAMINATION:   VITAL SIGNS: are as recorded.  GENERAL:  The patient appears well nourished and well-developed, and with normal affect.  No acute respiratory distress. Alert and oriented times 3. No skin rashes.     HEENT:  TMs normal bilaterally.  Canals and ears normal. Pharynx is clear. Extraocular eye motions intact and pain free.   Pupils reactive and equally round.  Sclerae and conjunctivae clear, normocephalic and atraumatic.      RESPIRATORY:  Clear and equal breath sounds with no acute respiratory distress.       HEART: Regular rhythm without murmur, rub or gallop.     ABDOMEN:  soft, nontender. No masses, guarding or rebound. Normoactive bowel sounds.     NECK: No masses or adenopathy palpable.  No bruits heard. No asymmetry visible.     COMPLETE EXTREMITIES: No edema, all four extremities with  posterior tibial and radial pulses strong and palpable.     EXTREMITIES:  no edema in any extremity. No cyanosis or clubbing.    LOW BACK: No tenderness to palpation of inferior lumbar spine or either sacroiliac joint area.       Assessment:      Diagnosis Orders   1. DM type 2 with diabetic dyslipidemia (HCC)  Comprehensive Metabolic Panel    Lipid Panel    Hemoglobin A1C   2. Essential hypertension     3. Iron deficiency anemia, unspecified iron deficiency anemia type  CBC Auto Differential    Iron And Tibc   4. Primary osteoarthritis of both knees     5. 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 S/P partial resection of colon     7. Vitamin D deficiency  Vitamin D 25 Hydroxy       Plan:      Orders Placed This Encounter   Procedures   . CBC Auto Differential     Standing Status:   Future     Number of Occurrences:   1     Standing Expiration Date:   10/29/2017   . Comprehensive Metabolic Panel     Standing Status:   Future     Number of Occurrences:   1     Standing Expiration Date:   10/29/2017   . Lipid Panel     Standing Status:   Future     Number of Occurrences:   1     Standing Expiration Date:   10/29/2017     Order Specific Question:   Is Patient Fasting?/# of Hours     Answer:   yes / 12 hrs   . Vitamin D 25 Hydroxy     Standing Status:   Future     Number of Occurrences:   1     Standing Expiration Date:   10/29/2017   . Hemoglobin A1C     Standing Status:   Future     Number of Occurrences:   1     Standing Expiration Date:   10/29/2017   .  Iron And Tibc     Standing Status:   Future     Number of Occurrences:   1     Standing Expiration Date:   10/29/2017     Order Specific Question:   Is Patient Fasting?     Answer:   n/a     Order Specific Question:   No of Hours?     Answer:   n/a     Orders Placed This Encounter   Medications   . glucose blood VI test strips (ACCU-CHEK SMARTVIEW) strip     Sig: Check daily DX: E11.9     Dispense:  100 each     Refill:  3         PHYSICAL EXAMINATION:   VITAL SIGNS:  are as recorded.  GENERAL:  The patient appears well nourished and well-developed, and with normal affect.  No acute respiratory distress. Alert and oriented times 3. No skin rashes.     HEENT:  TMs normal bilaterally.  Canals and ears normal. Pharynx is clear. Extraocular eye motions intact and pain free.   Pupils reactive and equally round.  Sclerae and conjunctivae clear, normocephalic and atraumatic.      RESPIRATORY:  Clear and equal breath sounds with no acute respiratory distress.       HEART: Regular rhythm without murmur, rub or gallop.     ABDOMEN:  soft, nontender. No masses, guarding or rebound. Normoactive bowel sounds.     NECK: No masses or adenopathy palpable.  No bruits heard. No asymmetry visible.     COMPLETE EXTREMITIES: No edema, all four extremities with posterior tibial and radial pulses strong and palpable.     EXTREMITIES:  no edema in any extremity. No cyanosis or clubbing.    LOW BACK: No tenderness to palpation of inferior lumbar spine or either sacroiliac joint area.       Controlled Substances Monitoring:      Return in about 3 weeks (around 11/22/2016).    I, Domenic Schwab, RMA  , am scribing for and in the presence of Ignacia Felling, MD. Electronically signed by :  Domenic Schwab, RMA    I, Ignacia Felling, MD, personally performed the services described in this documentation, as scribed by Domenic Schwab, RMA   in my presence, and it is both accurate and complete. Electronically signed by: Ignacia Felling, MD    11/02/16 6:09 AM    Ignacia Felling, MD

## 2016-10-30 LAB — LIPID PANEL
Cholesterol, Total: 130 mg/dL (ref 0–199)
HDL: 50 mg/dL (ref 40–59)
LDL Calculated: 49 mg/dL (ref 0–129)
Triglycerides: 157 mg/dL (ref 0–200)

## 2016-10-30 LAB — CBC WITH AUTO DIFFERENTIAL
Basophils %: 0.3 %
Basophils Absolute: 0 10*3/uL (ref 0.0–0.2)
Eosinophils %: 2.4 %
Eosinophils Absolute: 0.2 10*3/uL (ref 0.0–0.7)
Hematocrit: 34.6 % — ABNORMAL LOW (ref 37.0–47.0)
Hemoglobin: 11 g/dL — ABNORMAL LOW (ref 12.0–16.0)
Lymphocytes %: 32 %
Lymphocytes Absolute: 3.2 10*3/uL (ref 1.0–4.8)
MCH: 27.1 pg (ref 27.0–31.3)
MCHC: 31.8 % — ABNORMAL LOW (ref 33.0–37.0)
MCV: 85.1 fL (ref 82.0–100.0)
Monocytes %: 6.9 %
Monocytes Absolute: 0.7 10*3/uL (ref 0.2–0.8)
Neutrophils %: 58.4 %
Neutrophils Absolute: 5.9 10*3/uL (ref 1.4–6.5)
Platelets: 280 10*3/uL (ref 130–400)
RBC: 4.07 M/uL — ABNORMAL LOW (ref 4.20–5.40)
RDW: 15.1 % — ABNORMAL HIGH (ref 11.5–14.5)
WBC: 10.1 10*3/uL (ref 4.8–10.8)

## 2016-10-30 LAB — HEMOGLOBIN A1C: Hemoglobin A1C: 7.2 % — ABNORMAL HIGH (ref 4.8–5.9)

## 2016-10-30 LAB — COMPREHENSIVE METABOLIC PANEL
ALT: 16 U/L (ref 0–33)
AST: 15 U/L (ref 0–35)
Albumin: 4.3 g/dL (ref 3.9–4.9)
Alkaline Phosphatase: 60 U/L (ref 40–130)
Anion Gap: 18 mEq/L — ABNORMAL HIGH (ref 7–13)
BUN: 19 mg/dL (ref 8–23)
CO2: 23 mEq/L (ref 22–29)
Calcium: 9.9 mg/dL (ref 8.6–10.2)
Chloride: 97 mEq/L — ABNORMAL LOW (ref 98–107)
Creatinine: 0.64 mg/dL (ref 0.50–0.90)
GFR African American: >60 (ref >60)
GFR Non-African American: >60 (ref >60)
Globulin: 3.7 g/dL — ABNORMAL HIGH (ref 2.3–3.5)
Glucose: 108 mg/dL (ref 74–109)
Potassium: 4.5 mEq/L (ref 3.5–5.1)
Sodium: 138 mEq/L (ref 132–144)
Total Bilirubin: <0.2 mg/dL (ref 0.0–1.2)
Total Protein: 8 g/dL (ref 6.4–8.1)

## 2016-10-30 LAB — IRON AND TIBC
Iron Saturation: 8 % — ABNORMAL LOW (ref 11–46)
Iron: 24 ug/dL — ABNORMAL LOW (ref 37–145)
TIBC: 306 ug/dL (ref 178–450)

## 2016-10-30 LAB — VITAMIN D 25 HYDROXY: Vit D, 25-Hydroxy: 35.1 ng/mL (ref 30.0–100.0)

## 2016-11-16 ENCOUNTER — Ambulatory Visit: Admit: 2016-11-16 | Discharge: 2016-11-16 | Payer: MEDICARE | Attending: Family Medicine | Primary: Family Medicine

## 2016-11-16 DIAGNOSIS — I10 Essential (primary) hypertension: Secondary | ICD-10-CM

## 2016-11-16 MED ORDER — ATORVASTATIN CALCIUM 10 MG PO TABS
10 | ORAL_TABLET | ORAL | 1 refills | Status: DC
Start: 2016-11-16 — End: 2017-02-25

## 2016-11-16 MED ORDER — PANTOPRAZOLE SODIUM 40 MG PO TBEC
40 | ORAL_TABLET | Freq: Every day | ORAL | 1 refills | Status: DC
Start: 2016-11-16 — End: 2017-02-25

## 2016-11-16 MED ORDER — AMLODIPINE BESYLATE 10 MG PO TABS
10 | ORAL_TABLET | Freq: Every day | ORAL | 1 refills | Status: DC
Start: 2016-11-16 — End: 2017-02-25

## 2016-11-16 NOTE — Telephone Encounter (Signed)
CLINICAL PHARMACY NOTE: MEDICATION RECONCILATION    Lori Chase is a 76 y.o. female identified as being eligible for Medication Therapy Management (MTM) services: comprehensive medication review (CMR)    Medication reconciliation completed. Verified all current medications with patient, reviewed doses and frequency, along with indication for each. Medication list updated as appropriate, any discrepancies noted below    Pertinent Labs/Vitals:  BP Readings from Last 3 Encounters:   11/16/16 126/62   10/29/16 132/72   09/17/16 128/80        Lab Results   Component Value Date    LABA1C 7.2 (H) 10/29/2016       Lab Results   Component Value Date    CREATININE 0.64 10/29/2016        Estimated Creatinine Clearance: 69 mL/min (based on SCr of 0.64 mg/dL).     Lab Results   Component Value Date    LABMICR 2.80 (H) 06/19/2014       CONSIDERATIONS:  ?? Identified Medication Interactions: No clinically significant interactions identified via Lexicomp Interaction Analysis as category D or higher.  ?? Renal Dosing: No renal adjustments necessary.  ?? Blood Pressure Goal: BP less than 130/80 mmHg. Is at BP target of 130/80.  ?? Hyperlipidemia Goal: Patient is prescribed moderate-intensity statin therapy.  ?? Glycemic Goals: A1c less than 7%. Unable to assess if at blood glucose goal. Reviewed: Signs, symptoms and treatment of low blood sugar   ?? Drugs to avoid in the elderly: N/A  ?? Medication adherence: pt is adherent    MEDICATION DISCREPANCIES:  ?? No longer taking aspirin - she said [facility] told her she did not need it  ?? Taking Magnesium 250 mg PO daily OTC    ADDITIONAL NOTES:  ?? Patient asked about taking a supplement to boost her immune system and asked if it would interact with her medications. She thinks it is some type of olive oil capsule. I told her there should be no interactions, however if she decides to purchase I asked that she bring it into her next appointment to discuss with her PCP. Also mentioned that herbal  supplements are not studied as well as prescription medications and benefits are not well studied    Thank you,  Barbarann Ehlers. Stormy Fabian, PharmD  PGY-1 Pharmacy Resident  11/16/2016 4:22 PM

## 2016-11-16 NOTE — Progress Notes (Signed)
Subjective:      Patient ID: Lori Chase is a 76 y.o. female who presents today for:  Chief Complaint   Patient presents with   ??? Diabetes     Pt. is here for a f/u on chronic DM. Pt. is currently taking Amaryl and Metformin. Pts. last A1C was done on 10/29/2016 and was 7.2. Pt. checked her sugar today and it was 145.       Diabetes   She presents for her follow-up diabetic visit. She has type 2 diabetes mellitus. Her disease course has been stable. There are no hypoglycemic associated symptoms. Pertinent negatives for hypoglycemia include no dizziness or headaches. Pertinent negatives for diabetes include no blurred vision, no chest pain, no fatigue, no foot paresthesias, no foot ulcerations, no polydipsia, no polyphagia, no polyuria, no visual change, no weakness and no weight loss. There are no hypoglycemic complications. Risk factors for coronary artery disease include diabetes mellitus, hypertension, sedentary lifestyle and post-menopausal. Current diabetic treatment includes oral agent (dual therapy). She is compliant with treatment all of the time. She is following a generally healthy diet. She has not had a previous visit with a dietitian. She never participates in exercise. There is no change in her home blood glucose trend. Her overall blood glucose range is 140-180 mg/dl. She does not see a podiatrist.Eye exam is current.     Fu htn,dm,lipids,gerd      Chronic,stable    Past Medical History:   Diagnosis Date   ??? Anxiety    ??? Cancer (HCC)     breast   ??? Colon polyp    ??? Hypertension    ??? Osteopenia     last DXA 10/2014   ??? Right knee pain 01/07/2012   ??? 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 Type II or unspecified type diabetes mellitus without mention of complication, not stated as uncontrolled      Past Surgical History:   Procedure Laterality Date   ??? BREAST LUMPECTOMY  2007   ??? CHOLECYSTECTOMY     ??? COLONOSCOPY  09/18/12    [name]   ??? TONSILLECTOMY  age 4     Family History   Problem Relation  Age of Onset   ??? High Blood Pressure Mother    ??? High Blood Pressure Father      Social History     Social History   ??? Marital status: Married     Spouse name: N/A   ??? Number of children: N/A   ??? Years of education: N/A     Occupational History   ??? Not on file.     Social History Main Topics   ??? Smoking status: Never Smoker   ??? Smokeless tobacco: Never Used   ??? Alcohol use Yes      Comment: occ wine   ??? Drug use: No   ??? Sexual activity: Not on file     Other Topics Concern   ??? Not on file     Social History Narrative   ??? No narrative on file     Allergies:  Morphine; Oxycontin [oxycodone]; Penicillins; and Invokana [canagliflozin]    Review of Systems   Constitutional: Negative for activity change, appetite change, chills, diaphoresis, fatigue, fever, unexpected weight change and weight loss.   Eyes: Negative for blurred vision.   Respiratory: Negative for cough, shortness of breath and wheezing.    Cardiovascular: Negative for chest pain, palpitations and leg swelling.   Gastrointestinal: Negative for  abdominal pain.   Endocrine: Negative for polydipsia, polyphagia and polyuria.   Neurological: Negative for dizziness, weakness, light-headedness and headaches.       Objective:   BP 126/62 (Site: Right Arm, Position: Sitting, Cuff Size: Medium Adult)    Pulse 75    Temp 96.8 ??F (36 ??C) (Tympanic)    Resp 14    Ht 5' 6.5" (1.689 m)    Wt 135 lb (61.2 kg)    SpO2 98%    Breastfeeding? No    BMI 21.46 kg/m??     Physical Exam      PHYSICAL EXAMINATION:   VITAL SIGNS: are as recorded.  GENERAL:  The patient appears well nourished and well-developed, and with normal affect.  No acute respiratory distress. Alert and oriented times 3. No skin rashes.     HEENT:  TMs normal bilaterally.  Canals and ears normal. Pharynx is clear. Extraocular eye motions intact and pain free.   Pupils reactive and equally round.  Sclerae and conjunctivae clear, normocephalic and atraumatic.      RESPIRATORY:  Clear and equal breath sounds with  no acute respiratory distress.       HEART: Regular rhythm without murmur, rub or gallop.     ABDOMEN:  soft, nontender. No masses, guarding or rebound. Normoactive bowel sounds.     LOW BACK: No tenderness to palpation of inferior lumbar spine or either sacroiliac joint area.       Assessment:      Diagnosis Orders   1. Essential hypertension     2. DM type 2 with diabetic dyslipidemia (HCC)  atorvastatin (LIPITOR) 10 MG tablet   3. Hyperlipidemia, unspecified hyperlipidemia type  amLODIPine (NORVASC) 10 MG tablet   4. Gastroesophageal reflux disease with esophagitis  pantoprazole (PROTONIX) 40 MG tablet       Plan:      No orders of the defined types were placed in this encounter.    Orders Placed This Encounter   Medications   ??? atorvastatin (LIPITOR) 10 MG tablet     Sig: TAKE 1 TABLET BY MOUTH EVERY DAY.     Dispense:  90 tablet     Refill:  1   ??? amLODIPine (NORVASC) 10 MG tablet     Sig: Take 1 tablet by mouth daily     Dispense:  90 tablet     Refill:  1   ??? pantoprazole (PROTONIX) 40 MG tablet     Sig: Take 1 tablet by mouth daily     Dispense:  90 tablet     Refill:  1       Controlled Substances Monitoring:      Return in about 4 weeks (around 12/14/2016).    I, Dorathy Daft RMA, am scribing for and in the presence of Ignacia Felling, MD. Electronically signed by :  Dorathy Daft RMA    I, Ignacia Felling, MD, personally performed the services described in this documentation, as scribed by Dorathy Daft RMA   in my presence, and it is both accurate and complete. Electronically signed by: Ignacia Felling, MD    11/16/16 4:57 PM    Ignacia Felling, MD

## 2016-11-19 ENCOUNTER — Encounter: Attending: Family Medicine | Primary: Family Medicine

## 2016-12-14 ENCOUNTER — Encounter: Attending: Family Medicine | Primary: Family Medicine

## 2017-01-28 ENCOUNTER — Ambulatory Visit: Admit: 2017-01-28 | Discharge: 2017-01-28 | Payer: MEDICARE | Attending: Family Medicine | Primary: Family Medicine

## 2017-01-28 ENCOUNTER — Encounter

## 2017-01-28 DIAGNOSIS — I10 Essential (primary) hypertension: Secondary | ICD-10-CM

## 2017-01-28 LAB — HEMOGLOBIN A1C: Hemoglobin A1C: 6.9 % — ABNORMAL HIGH (ref 4.8–5.9)

## 2017-01-28 NOTE — Progress Notes (Signed)
Subjective:      Patient ID: Lori Chase is a 76 y.o. female who presents today for:  Chief Complaint   Patient presents with   . Hypertension     Pt. is here for a follow up on HTN, she takes Metformin, she states she is doing good. no SOB, no Dizziness, not rapid HB, no swelling.       Hypertension   This is a chronic problem. The current episode started more than 1 year ago. The problem is unchanged. The problem is controlled. Pertinent negatives include no anxiety, blurred vision, chest pain, headaches, malaise/fatigue, neck pain, orthopnea, palpitations, peripheral edema, PND, shortness of breath or sweats. There are no associated agents to hypertension. Risk factors for coronary artery disease include dyslipidemia, diabetes mellitus and post-menopausal state. Treatments tried: Norvasc. The current treatment provides moderate improvement. There are no compliance problems.  There is no history of chronic renal disease.   Hyperlipidemia   This is a chronic problem. The current episode started more than 1 year ago. The problem is controlled. Recent lipid tests were reviewed and are normal. Exacerbating diseases include diabetes. She has no history of chronic renal disease, hypothyroidism, liver disease, obesity or nephrotic syndrome. There are no known factors aggravating her hyperlipidemia. Pertinent negatives include no chest pain, focal sensory loss, focal weakness, leg pain, myalgias or shortness of breath. Treatments tried: Lipitor. The current treatment provides moderate improvement of lipids. There are no compliance problems.  Risk factors for coronary artery disease include hypertension, dyslipidemia, diabetes mellitus and post-menopausal.   Diabetes   She presents for her follow-up diabetic visit. She has type 2 diabetes mellitus. Her disease course has been stable. There are no hypoglycemic associated symptoms. Pertinent negatives for hypoglycemia include no confusion, dizziness, headaches,  nervousness/anxiousness, pallor, speech difficulty, sweats or tremors. There are no diabetic associated symptoms. Pertinent negatives for diabetes include no blurred vision, no chest pain, no polydipsia and no polyphagia. There are no hypoglycemic complications. Symptoms are stable. There are no diabetic complications. Risk factors for coronary artery disease include post-menopausal, dyslipidemia, diabetes mellitus and hypertension. Current diabetic treatments: Metformin, Amaryl. She is compliant with treatment all of the time. Her weight is stable. She is following a generally healthy diet. Eye exam is current.     Fu GERD  Chronic, stable    Past Medical History:   Diagnosis Date   . Anxiety    . Cancer [facility])     breast   . Colon polyp    . Hypertension    . Osteopenia     last DXA 10/2014   . Right knee pain 01/07/2012   . 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 Type II or unspecified type diabetes mellitus without mention of complication, not stated as uncontrolled      Past Surgical History:   Procedure Laterality Date   . BREAST LUMPECTOMY  2007   . CHOLECYSTECTOMY     . COLONOSCOPY  09/18/12    [name]   . TONSILLECTOMY  age 19     Family History   Problem Relation Age of Onset   . High Blood Pressure Mother    . High Blood Pressure Father      Social History     Social History   . Marital status: Married     Spouse name: N/A   . Number of children: N/A   . Years of education: N/A     Occupational History   .  Not on file.     Social History Main Topics   . Smoking status: Never Smoker   . Smokeless tobacco: Never Used   . Alcohol use Yes      Comment: occ wine   . Drug use: No   . Sexual activity: Not on file     Other Topics Concern   . Not on file     Social History Narrative   . No narrative on file     Allergies:  Morphine; Oxycontin [oxycodone]; Penicillins; and Invokana [canagliflozin]    Review of Systems   Constitutional: Negative for activity change, appetite change, chills, diaphoresis, fever and  malaise/fatigue.   HENT: Positive for ear pain (left). Negative for congestion, ear discharge, facial swelling, hearing loss and mouth sores.    Eyes: Negative for blurred vision, photophobia, pain, discharge and redness.   Respiratory: Negative for apnea, choking, chest tightness, shortness of breath, wheezing and stridor.    Cardiovascular: Negative for chest pain, palpitations, orthopnea, leg swelling and PND.   Gastrointestinal: Negative for abdominal pain, constipation, diarrhea and nausea.   Endocrine: Negative for cold intolerance, heat intolerance, polydipsia and polyphagia.   Genitourinary: Negative for dysuria and frequency.   Musculoskeletal: Negative for gait problem, joint swelling, myalgias, neck pain and neck stiffness.   Skin: Negative for color change, pallor, rash and wound.   Allergic/Immunologic: Negative for immunocompromised state.   Neurological: Negative for dizziness, tremors, focal weakness, syncope, facial asymmetry, speech difficulty, light-headedness and headaches.   Psychiatric/Behavioral: Negative for confusion and hallucinations. The patient is not nervous/anxious and is not hyperactive.        Objective:   BP 114/70 (Site: Right Upper Arm, Position: Sitting, Cuff Size: Medium Adult)   Pulse 73   Temp 97.9 F (36.6 C) (Oral)   Resp 16   Ht 5' 6.5" (1.689 m)   Wt 143 lb 9.6 oz (65.1 kg)   SpO2 99%   BMI 22.83 kg/m     Physical Exam      PHYSICAL EXAMINATION:   VITAL SIGNS: are as recorded.  GENERAL:  The patient appears well nourished and well-developed, and with normal affect.  No acute respiratory distress. Alert and oriented times 3. No skin rashes.     HEENT:  TMs normal bilaterally.  Canals and ears normal. Pharynx is clear. Extraocular eye motions intact and pain free.   Pupils reactive and equally round.  Sclerae and conjunctivae clear, normocephalic and atraumatic.      RESPIRATORY:  Clear and equal breath sounds with no acute respiratory distress.       HEART: Regular  rhythm without murmur, rub or gallop.     ABDOMEN:  soft, nontender. No masses, guarding or rebound. Normoactive bowel sounds.     NECK: No masses or adenopathy palpable.  No bruits heard. No asymmetry visible.     COMPLETE EXTREMITIES: No edema, all four extremities with posterior tibial and radial pulses strong and palpable.     EXTREMITIES:  no edema in any extremity. No cyanosis or clubbing.    LOW BACK: No tenderness to palpation of inferior lumbar spine or either sacroiliac joint area.       Assessment:      Diagnosis Orders   1. Essential hypertension     2. DM type 2 with diabetic dyslipidemia (HCC)  Hemoglobin A1C   3. Hyperlipidemia, unspecified hyperlipidemia type  Lipid Panel   4. Gastroesophageal reflux disease with esophagitis     5. Fatigue, unspecified  type  CBC Auto Differential    Comprehensive Metabolic Panel    TSH Without Reflex   6. Vitamin D deficiency  Vitamin D 25 Hydroxy   7. Iron deficiency anemia, unspecified iron deficiency anemia type  Iron And Tibc       Plan:      Orders Placed This Encounter   Procedures   . CBC Auto Differential     Standing Status:   Future     Number of Occurrences:   1     Standing Expiration Date:   01/28/2018   . Comprehensive Metabolic Panel     Standing Status:   Future     Number of Occurrences:   1     Standing Expiration Date:   01/28/2018   . Lipid Panel     Standing Status:   Future     Number of Occurrences:   1     Standing Expiration Date:   02/27/2017     Order Specific Question:   Is Patient Fasting?/# of Hours     Answer:   yes   . Hemoglobin A1C     Standing Status:   Future     Number of Occurrences:   1     Standing Expiration Date:   01/28/2018   . Vitamin D 25 Hydroxy     Standing Status:   Future     Number of Occurrences:   1     Standing Expiration Date:   01/28/2018   . TSH Without Reflex     Standing Status:   Future     Number of Occurrences:   1     Standing Expiration Date:   01/28/2018   . Iron And Tibc     Standing Status:   Future     Number  of Occurrences:   1     Standing Expiration Date:   01/28/2018     Order Specific Question:   Is Patient Fasting?     Answer:   yes     Order Specific Question:   No of Hours?     Answer:   8     No orders of the defined types were placed in this encounter.    Use debrox for left ear fullness/ pain    Controlled Substances Monitoring:      Return in about 4 weeks (around 02/25/2017).    I, Leonidas Romberg, RMA  , am scribing for and in the presence of Ignacia Felling, MD. Electronically signed by :  Leonidas Romberg, RMA'    I, Ignacia Felling, MD, personally performed the services described in this documentation, as scribed by Leonidas Romberg, RMA   in my presence, and it is both accurate and complete. Electronically signed by: Ignacia Felling, MD    02/06/17 10:58 PM    Ignacia Felling, MD

## 2017-01-29 LAB — COMPREHENSIVE METABOLIC PANEL
ALT: 20 U/L (ref 0–33)
AST: 18 U/L (ref 0–35)
Albumin: 4.4 g/dL (ref 3.9–4.9)
Alkaline Phosphatase: 56 U/L (ref 40–130)
Anion Gap: 14 mEq/L — ABNORMAL HIGH (ref 7–13)
BUN: 25 mg/dL — ABNORMAL HIGH (ref 8–23)
CO2: 26 mEq/L (ref 22–29)
Calcium: 10.5 mg/dL — ABNORMAL HIGH (ref 8.6–10.2)
Chloride: 99 mEq/L (ref 98–107)
Creatinine: 0.79 mg/dL (ref 0.50–0.90)
GFR African American: >60 (ref >60)
GFR Non-African American: >60 (ref >60)
Globulin: 3.8 g/dL — ABNORMAL HIGH (ref 2.3–3.5)
Glucose: 138 mg/dL — ABNORMAL HIGH (ref 74–109)
Potassium: 5.2 mEq/L — ABNORMAL HIGH (ref 3.5–5.1)
Sodium: 139 mEq/L (ref 132–144)
Total Bilirubin: <0.2 mg/dL (ref 0.0–1.2)
Total Protein: 8.2 g/dL — ABNORMAL HIGH (ref 6.4–8.1)

## 2017-01-29 LAB — IRON AND TIBC
Iron Saturation: 7 % — ABNORMAL LOW (ref 11–46)
Iron: 29 ug/dL — ABNORMAL LOW (ref 37–145)
TIBC: 390 ug/dL (ref 178–450)

## 2017-01-29 LAB — VITAMIN D 25 HYDROXY: Vit D, 25-Hydroxy: 53.2 ng/mL (ref 30.0–100.0)

## 2017-01-29 LAB — CBC WITH AUTO DIFFERENTIAL
Basophils %: 0.3 %
Basophils Absolute: 0 10*3/uL (ref 0.0–0.2)
Eosinophils %: 1.9 %
Eosinophils Absolute: 0.2 10*3/uL (ref 0.0–0.7)
Hematocrit: 35.4 % — ABNORMAL LOW (ref 37.0–47.0)
Hemoglobin: 11.3 g/dL — ABNORMAL LOW (ref 12.0–16.0)
Lymphocytes %: 27.6 %
Lymphocytes Absolute: 2.7 10*3/uL (ref 1.0–4.8)
MCH: 25.7 pg — ABNORMAL LOW (ref 27.0–31.3)
MCHC: 32 % — ABNORMAL LOW (ref 33.0–37.0)
MCV: 80.3 fL — ABNORMAL LOW (ref 82.0–100.0)
Monocytes %: 8.6 %
Monocytes Absolute: 0.8 10*3/uL (ref 0.2–0.8)
Neutrophils %: 61.6 %
Neutrophils Absolute: 6 10*3/uL (ref 1.4–6.5)
Platelets: 264 10*3/uL (ref 130–400)
RBC: 4.41 M/uL (ref 4.20–5.40)
RDW: 16.8 % — ABNORMAL HIGH (ref 11.5–14.5)
WBC: 9.7 10*3/uL (ref 4.8–10.8)

## 2017-01-29 LAB — LIPID PANEL
Cholesterol, Total: 141 mg/dL (ref 0–199)
HDL: 56 mg/dL (ref 40–59)
LDL Calculated: 53 mg/dL (ref 0–129)
Triglycerides: 159 mg/dL (ref 0–200)

## 2017-01-29 LAB — TSH: TSH: 1.47 u[IU]/mL (ref 0.270–4.200)

## 2017-02-25 ENCOUNTER — Ambulatory Visit: Admit: 2017-02-25 | Discharge: 2017-02-25 | Payer: MEDICARE | Attending: Family Medicine | Primary: Family Medicine

## 2017-02-25 DIAGNOSIS — E785 Hyperlipidemia, unspecified: Secondary | ICD-10-CM

## 2017-02-25 MED ORDER — PANTOPRAZOLE SODIUM 40 MG PO TBEC
40 | ORAL_TABLET | Freq: Every day | ORAL | 1 refills | Status: DC
Start: 2017-02-25 — End: 2017-05-31

## 2017-02-25 MED ORDER — VITAMIN D 50 MCG (2000 UT) PO TABS
50 | ORAL_TABLET | Freq: Every day | ORAL | 1 refills | Status: AC
Start: 2017-02-25 — End: ?

## 2017-02-25 MED ORDER — ALENDRONATE SODIUM 70 MG PO TABS
70 | ORAL_TABLET | ORAL | 1 refills | Status: DC
Start: 2017-02-25 — End: 2017-05-31

## 2017-02-25 MED ORDER — ATORVASTATIN CALCIUM 10 MG PO TABS
10 | ORAL_TABLET | ORAL | 1 refills | Status: DC
Start: 2017-02-25 — End: 2017-05-31

## 2017-02-25 MED ORDER — AMLODIPINE BESYLATE 10 MG PO TABS
10 | ORAL_TABLET | Freq: Every day | ORAL | 1 refills | Status: DC
Start: 2017-02-25 — End: 2017-05-31

## 2017-02-25 MED ORDER — METFORMIN HCL ER 500 MG PO TB24
500 | ORAL_TABLET | Freq: Four times a day (QID) | ORAL | 3 refills | Status: DC
Start: 2017-02-25 — End: 2017-05-31

## 2017-02-25 MED ORDER — GLUCOSE BLOOD VI STRP
3 refills | Status: DC
Start: 2017-02-25 — End: 2017-05-31

## 2017-02-25 NOTE — Progress Notes (Signed)
Subjective:      Patient ID: Lori Chase is a 76 y.o. female who presents today for:  Chief Complaint   Patient presents with   . Diabetes     Patient presents here for a 4 week follow up on her Diabetes. Patient las A1c was 01-28-17  6.9. Patient states that her Dm is between 100-150   . Hypertension     Patient states that her BP is good.    . Flu Vaccine     pending       Diabetes   She presents for her follow-up diabetic visit. She has type 2 diabetes mellitus. Her disease course has been stable. There are no hypoglycemic associated symptoms. Pertinent negatives for hypoglycemia include no confusion, dizziness, headaches, nervousness/anxiousness, pallor, speech difficulty, sweats or tremors. There are no diabetic associated symptoms. Pertinent negatives for diabetes include no blurred vision, no chest pain, no polydipsia and no polyphagia. There are no hypoglycemic complications. Symptoms are stable. There are no diabetic complications. Risk factors for coronary artery disease include dyslipidemia, diabetes mellitus, hypertension and post-menopausal. Current diabetic treatments: Metformin, Amaryl. She is compliant with treatment all of the time. Her weight is stable. She is following a generally healthy diet. Eye exam is current.   Hypertension   This is a chronic problem. The current episode started more than 1 year ago. The problem is unchanged. The problem is controlled. Pertinent negatives include no anxiety, blurred vision, chest pain, headaches, malaise/fatigue, neck pain, orthopnea, palpitations, peripheral edema, PND, shortness of breath or sweats. There are no associated agents to hypertension. Risk factors for coronary artery disease include dyslipidemia, diabetes mellitus and post-menopausal state. Treatments tried: Norvasc. The current treatment provides moderate improvement. There are no compliance problems.  There is no history of chronic renal disease.   Hyperlipidemia   This is a chronic  problem. The current episode started more than 1 year ago. The problem is controlled. Recent lipid tests were reviewed and are normal. Exacerbating diseases include diabetes. She has no history of chronic renal disease, hypothyroidism, liver disease, obesity or nephrotic syndrome. There are no known factors aggravating her hyperlipidemia. Pertinent negatives include no chest pain, focal sensory loss, focal weakness, leg pain, myalgias or shortness of breath. Treatments tried: Lipitor. The current treatment provides moderate improvement of lipids. There are no compliance problems.  Risk factors for coronary artery disease include post-menopausal, dyslipidemia, diabetes mellitus and hypertension.     Fu Vit D, GERD  Chronic, stable    Past Medical History:   Diagnosis Date   . Anxiety    . Cancer [facility])     breast   . Colon polyp    . Hypertension    . Osteopenia     last DXA 10/2014   . Right knee pain 01/07/2012   . 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 Type II or unspecified type diabetes mellitus without mention of complication, not stated as uncontrolled      Past Surgical History:   Procedure Laterality Date   . BREAST LUMPECTOMY  2007   . CHOLECYSTECTOMY     . COLONOSCOPY  09/18/12    [name]   . TONSILLECTOMY  age 61     Family History   Problem Relation Age of Onset   . High Blood Pressure Mother    . High Blood Pressure Father      Social History     Social History   . Marital status: Married  Spouse name: N/A   . Number of children: N/A   . Years of education: N/A     Occupational History   . Not on file.     Social History Main Topics   . Smoking status: Never Smoker   . Smokeless tobacco: Never Used   . Alcohol use Yes      Comment: occ wine   . Drug use: No   . Sexual activity: Not on file     Other Topics Concern   . Not on file     Social History Narrative   . No narrative on file     Allergies:  Morphine; Oxycontin [oxycodone]; Penicillins; and Invokana [canagliflozin]    Review of Systems    Constitutional: Negative for activity change, appetite change, chills, diaphoresis, fever and malaise/fatigue.   HENT: Negative for congestion, ear discharge, ear pain, facial swelling, hearing loss and mouth sores.    Eyes: Negative for blurred vision, photophobia, pain, discharge and redness.   Respiratory: Negative for apnea, choking, chest tightness, shortness of breath, wheezing and stridor.    Cardiovascular: Negative for chest pain, palpitations, orthopnea, leg swelling and PND.   Gastrointestinal: Negative for abdominal pain, constipation, diarrhea and nausea.   Endocrine: Negative for cold intolerance, heat intolerance, polydipsia and polyphagia.   Genitourinary: Negative for dysuria and frequency.   Musculoskeletal: Negative for gait problem, joint swelling, myalgias, neck pain and neck stiffness.   Skin: Negative for color change, pallor, rash and wound.   Allergic/Immunologic: Negative for immunocompromised state.   Neurological: Negative for dizziness, tremors, focal weakness, syncope, facial asymmetry, speech difficulty, light-headedness and headaches.   Psychiatric/Behavioral: Negative for confusion and hallucinations. The patient is not nervous/anxious and is not hyperactive.        Objective:   BP 136/60 (Site: Left Upper Arm, Position: Sitting, Cuff Size: Small Adult)   Pulse 82   Temp 96.5 F (35.8 C) (Tympanic)   Resp 16   Ht 5\' 6"  (1.676 m)   Wt 145 lb 1.6 oz (65.8 kg)   SpO2 98%   BMI 23.42 kg/m     Physical Exam      PHYSICAL EXAMINATION:   VITAL SIGNS: are as recorded.  GENERAL:  The patient appears well nourished and well-developed, and with normal affect.  No acute respiratory distress. Alert and oriented times 3. No skin rashes.     HEENT:  TMs normal bilaterally.  Canals and ears normal. Pharynx is clear. Extraocular eye motions intact and pain free.   Pupils reactive and equally round.  Sclerae and conjunctivae clear, normocephalic and atraumatic.      RESPIRATORY:  Clear and  equal breath sounds with no acute respiratory distress.       HEART: Regular rhythm without murmur, rub or gallop.     ABDOMEN:  soft, nontender. No masses, guarding or rebound. Normoactive bowel sounds.     LOW BACK: No tenderness to palpation of inferior lumbar spine or either sacroiliac joint area.       Assessment:      Diagnosis Orders   1. DM type 2 with diabetic dyslipidemia (HCC)  atorvastatin (LIPITOR) 10 MG tablet    Hemoglobin A1C   2. Hyperlipidemia, unspecified hyperlipidemia type  amLODIPine (NORVASC) 10 MG tablet    Lipid Panel   3. Gastroesophageal reflux disease with esophagitis  pantoprazole (PROTONIX) 40 MG tablet   4. Osteoporosis, unspecified osteoporosis type, unspecified pathological fracture presence  alendronate (FOSAMAX) 70 MG tablet   5.  Osteopenia, unspecified location  alendronate (FOSAMAX) 70 MG tablet   6. Vitamin D deficiency  Cholecalciferol (VITAMIN D) 2000 units TABS tablet    Vitamin D 25 Hydroxy   7. Need for influenza vaccination  INFLUENZA, HIGH DOSE, 65 YRS +, IM, PF, PREFILL SYR, 0.5ML (FLUZONE HD)   8. Fatigue, unspecified type  CBC Auto Differential    Comprehensive Metabolic Panel   9. Iron deficiency  Iron And Tibc       Plan:      Orders Placed This Encounter   Procedures   . INFLUENZA, HIGH DOSE, 65 YRS +, IM, PF, PREFILL SYR, 0.5ML (FLUZONE HD)     No orders of the defined types were placed in this encounter.    BW prior    See bw stable    BP next    Controlled Substances Monitoring:      Return in about 3 months (around 05/28/2017).    I, Leonidas Romberg, RMA  , am scribing for and in the presence of Ignacia Felling, MD. Electronically signed by :  Leonidas Romberg, RMA    I, Ignacia Felling, MD, personally performed the services described in this documentation, as scribed by Leonidas Romberg, RMA   in my presence, and it is both accurate and complete. Electronically signed by: Ignacia Felling, MD    02/25/17 3:08 PM    Ignacia Felling, MD

## 2017-05-30 ENCOUNTER — Encounter: Attending: Family Medicine | Primary: Family Medicine

## 2017-05-31 ENCOUNTER — Encounter

## 2017-05-31 ENCOUNTER — Ambulatory Visit: Admit: 2017-05-31 | Discharge: 2017-05-31 | Payer: MEDICARE | Attending: Family Medicine | Primary: Family Medicine

## 2017-05-31 DIAGNOSIS — E785 Hyperlipidemia, unspecified: Secondary | ICD-10-CM

## 2017-05-31 MED ORDER — METFORMIN HCL ER 500 MG PO TB24
500 | ORAL_TABLET | Freq: Four times a day (QID) | ORAL | 3 refills | Status: DC
Start: 2017-05-31 — End: 2017-07-22

## 2017-05-31 MED ORDER — ATORVASTATIN CALCIUM 10 MG PO TABS
10 | ORAL_TABLET | ORAL | 1 refills | Status: AC
Start: 2017-05-31 — End: ?

## 2017-05-31 MED ORDER — AMLODIPINE BESYLATE 10 MG PO TABS
10 | ORAL_TABLET | Freq: Every day | ORAL | 1 refills | Status: AC
Start: 2017-05-31 — End: ?

## 2017-05-31 MED ORDER — ACCU-CHEK FASTCLIX LANCETS MISC
3 refills | Status: AC
Start: 2017-05-31 — End: ?

## 2017-05-31 MED ORDER — PANTOPRAZOLE SODIUM 40 MG PO TBEC
40 | ORAL_TABLET | Freq: Every day | ORAL | 1 refills | Status: AC
Start: 2017-05-31 — End: ?

## 2017-05-31 MED ORDER — GLIMEPIRIDE 4 MG PO TABS
4 | ORAL_TABLET | ORAL | 1 refills | Status: AC
Start: 2017-05-31 — End: ?

## 2017-05-31 MED ORDER — GLUCOSE BLOOD VI STRP
3 refills | Status: AC
Start: 2017-05-31 — End: ?

## 2017-05-31 MED ORDER — ALENDRONATE SODIUM 70 MG PO TABS
70 | ORAL_TABLET | ORAL | 1 refills | Status: AC
Start: 2017-05-31 — End: ?

## 2017-05-31 NOTE — Progress Notes (Signed)
Subjective:      Patient ID: Lori Chase is a 77 y.o. female who presents today for:  Chief Complaint   Patient presents with   . Diabetes     Pt. is here for a f/u on chronic DM. Pt. is currently taking Metformin and Amaryl. Pts. last A1C was done on 01/28/2017 and was 6.9. Pt. checked her sugar this morning and it was 144. Pt. states they range between 84-150.    Marland Kitchen Hypertension     Pt. is here for a f/u on chronic HTN. Pt. is currently taking Norvasc. Pt. denies any chest pain, SOB, dizziness or palpitations.        Diabetes   She presents for her follow-up diabetic visit. She has type 2 diabetes mellitus. Her disease course has been stable. There are no hypoglycemic associated symptoms. Pertinent negatives for hypoglycemia include no headaches. There are no diabetic associated symptoms. Pertinent negatives for diabetes include no blurred vision and no chest pain. There are no hypoglycemic complications. Symptoms are stable. Risk factors for coronary artery disease include dyslipidemia, diabetes mellitus, hypertension and post-menopausal. Current diabetic treatment includes oral agent (dual therapy). She is compliant with treatment all of the time. She is following a generally healthy diet. Meal planning includes avoidance of concentrated sweets.   Hypertension   This is a chronic problem. The current episode started more than 1 year ago. The problem has been gradually improving since onset. The problem is controlled. Pertinent negatives include no blurred vision, chest pain, headaches, palpitations, PND or shortness of breath. Risk factors for coronary artery disease include post-menopausal state, dyslipidemia and diabetes mellitus. The current treatment provides moderate improvement. There are no compliance problems.    Hyperlipidemia   This is a chronic problem. The current episode started more than 1 year ago. The problem is controlled. Recent lipid tests were reviewed and are normal. Exacerbating diseases  include diabetes. Pertinent negatives include no chest pain or shortness of breath. Current antihyperlipidemic treatment includes statins. The current treatment provides moderate improvement of lipids. There are no compliance problems.  Risk factors for coronary artery disease include hypertension, diabetes mellitus, dyslipidemia and post-menopausal.       Past Medical History:   Diagnosis Date   . Anxiety    . Cancer [facility])     breast   . Colon polyp    . Hypertension    . Osteopenia     last DXA 10/2014   . Right knee pain 01/07/2012   . 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 Type II or unspecified type diabetes mellitus without mention of complication, not stated as uncontrolled      Past Surgical History:   Procedure Laterality Date   . BREAST LUMPECTOMY  2007   . CHOLECYSTECTOMY     . COLONOSCOPY  09/18/12    [name]   . TONSILLECTOMY  age 87     Family History   Problem Relation Age of Onset   . High Blood Pressure Mother    . High Blood Pressure Father      Social History     Social History   . Marital status: Married     Spouse name: N/A   . Number of children: N/A   . Years of education: N/A     Occupational History   . Not on file.     Social History Main Topics   . Smoking status: Never Smoker   . Smokeless tobacco: Never Used   .  Alcohol use Yes      Comment: occ wine   . Drug use: No   . Sexual activity: Not on file     Other Topics Concern   . Not on file     Social History Narrative   . No narrative on file     Allergies:  Morphine; Oxycontin [oxycodone]; Penicillins; and Invokana [canagliflozin]    Review of Systems   Constitutional: Negative for activity change and appetite change.   HENT: Negative for congestion and dental problem.    Eyes: Negative for blurred vision.   Respiratory: Negative for apnea, chest tightness and shortness of breath.    Cardiovascular: Negative for chest pain, palpitations and PND.   Gastrointestinal: Negative for abdominal distention and abdominal pain.    Musculoskeletal: Negative for arthralgias and back pain.   Skin: Negative for color change.   Neurological: Negative for headaches.       Objective:   BP 122/70 (Site: Left Upper Arm, Position: Sitting, Cuff Size: Medium Adult)   Pulse 83   Temp 96.6 F (35.9 C) (Tympanic)   Resp 14   Ht 5\' 6"  (1.676 m)   Wt 147 lb (66.7 kg)   SpO2 98%   Breastfeeding? No   BMI 23.73 kg/m     Physical Exam    Assessment:      Diagnosis Orders   1. DM type 2 with diabetic dyslipidemia (HCC)  HM DIABETES FOOT EXAM    atorvastatin (LIPITOR) 10 MG tablet    metFORMIN (GLUCOPHAGE-XR) 500 MG extended release tablet    glimepiride (AMARYL) 4 MG tablet    blood glucose test strips (ACCU-CHEK SMARTVIEW) strip    ACCU-CHEK FASTCLIX LANCETS MISC   2. Hyperlipidemia, unspecified hyperlipidemia type  amLODIPine (NORVASC) 10 MG tablet   3. Gastroesophageal reflux disease with esophagitis  pantoprazole (PROTONIX) 40 MG tablet   4. Osteoporosis, unspecified osteoporosis type, unspecified pathological fracture presence  alendronate (FOSAMAX) 70 MG tablet   5. Osteopenia, unspecified location  alendronate (FOSAMAX) 70 MG tablet   6. Vitamin D deficiency     7. Essential hypertension     8. Type II or unspecified type diabetes mellitus without mention of complication, not stated as uncontrolled         Plan:      Orders Placed This Encounter   Procedures   . HM DIABETES FOOT EXAM     Orders Placed This Encounter   Medications   . atorvastatin (LIPITOR) 10 MG tablet     Sig: TAKE 1 TABLET BY MOUTH EVERY DAY.     Dispense:  90 tablet     Refill:  1   . amLODIPine (NORVASC) 10 MG tablet     Sig: Take 1 tablet by mouth daily     Dispense:  90 tablet     Refill:  1   . pantoprazole (PROTONIX) 40 MG tablet     Sig: Take 1 tablet by mouth daily     Dispense:  90 tablet     Refill:  1   . metFORMIN (GLUCOPHAGE-XR) 500 MG extended release tablet     Sig: Take 1 tablet by mouth 4 times daily     Dispense:  120 tablet     Refill:  3   . alendronate  (FOSAMAX) 70 MG tablet     Sig: TAKE 1 TABLET BY MOUTH ONCE A WEEK     Dispense:  12 tablet     Refill:  1   .  glimepiride (AMARYL) 4 MG tablet     Sig: TAKE 2 TABLETS DAILY     Dispense:  180 tablet     Refill:  1   . blood glucose test strips (ACCU-CHEK SMARTVIEW) strip     Sig: Check daily DX: E11.9     Dispense:  100 each     Refill:  3   . ACCU-CHEK FASTCLIX LANCETS MISC     Sig: Check daily DX: E11.9     Dispense:  100 each     Refill:  3       The current medical regimen is effective;  continue present plan and medications.    See labs ok      hgb A1c 7.8    Controlled Substances Monitoring:     Return in about 3 months (around 08/29/2017).    Darden Palmer. Cox, RMA   , am scribing for and in the presence of Ignacia Felling, MD. Electronically signed by :Colen Darling. Cox, RMA     I, Ignacia Felling, MD, personally performed the services described in this documentation, as scribed by Colen Darling. Cox, RMA    in my presence, and it is both accurate and complete. Electronically signed by: Ignacia Felling, MD    06/12/17 7:36 PM    Ignacia Felling, MD

## 2017-06-01 LAB — LIPID PANEL
Cholesterol, Total: 138 mg/dL (ref 0–199)
HDL: 50 mg/dL (ref 40–59)
LDL Calculated: 50 mg/dL (ref 0–129)
Triglycerides: 192 mg/dL (ref 0–200)

## 2017-06-01 LAB — CBC WITH AUTO DIFFERENTIAL
Basophils %: 0.3 %
Basophils Absolute: 0 10*3/uL (ref 0.0–0.2)
Eosinophils %: 1.6 %
Eosinophils Absolute: 0.2 10*3/uL (ref 0.0–0.7)
Hematocrit: 37.3 % (ref 37.0–47.0)
Hemoglobin: 11.9 g/dL — ABNORMAL LOW (ref 12.0–16.0)
Lymphocytes %: 23 %
Lymphocytes Absolute: 2.6 10*3/uL (ref 1.0–4.8)
MCH: 25 pg — ABNORMAL LOW (ref 27.0–31.3)
MCHC: 31.9 % — ABNORMAL LOW (ref 33.0–37.0)
MCV: 78.6 fL — ABNORMAL LOW (ref 82.0–100.0)
Monocytes %: 8.1 %
Monocytes Absolute: 0.9 10*3/uL — ABNORMAL HIGH (ref 0.2–0.8)
Neutrophils %: 67 %
Neutrophils Absolute: 7.7 10*3/uL — ABNORMAL HIGH (ref 1.4–6.5)
Platelets: 250 10*3/uL (ref 130–400)
RBC: 4.75 M/uL (ref 4.20–5.40)
RDW: 17.6 % — ABNORMAL HIGH (ref 11.5–14.5)
WBC: 11.5 10*3/uL — ABNORMAL HIGH (ref 4.8–10.8)

## 2017-06-01 LAB — COMPREHENSIVE METABOLIC PANEL
ALT: 21 U/L (ref 0–33)
AST: 20 U/L (ref 0–35)
Albumin: 4.6 g/dL (ref 3.9–4.9)
Alkaline Phosphatase: 54 U/L (ref 40–130)
Anion Gap: 20 mEq/L — ABNORMAL HIGH (ref 7–13)
BUN: 17 mg/dL (ref 8–23)
CO2: 23 mEq/L (ref 22–29)
Calcium: 10.3 mg/dL — ABNORMAL HIGH (ref 8.6–10.2)
Chloride: 97 mEq/L — ABNORMAL LOW (ref 98–107)
Creatinine: 0.78 mg/dL (ref 0.50–0.90)
GFR African American: >60 (ref >60)
GFR Non-African American: >60 (ref >60)
Globulin: 3.5 g/dL (ref 2.3–3.5)
Glucose: 150 mg/dL — ABNORMAL HIGH (ref 74–109)
Potassium: 4.7 mEq/L (ref 3.5–5.1)
Sodium: 140 mEq/L (ref 132–144)
Total Bilirubin: 0.3 mg/dL (ref 0.0–1.2)
Total Protein: 8.1 g/dL (ref 6.4–8.1)

## 2017-06-01 LAB — IRON AND TIBC
Iron Saturation: 11 % (ref 11–46)
Iron: 39 ug/dL (ref 37–145)
TIBC: 367 ug/dL (ref 178–450)

## 2017-06-01 LAB — VITAMIN D 25 HYDROXY: Vit D, 25-Hydroxy: 45 ng/mL (ref 30.0–100.0)

## 2017-06-01 LAB — HEMOGLOBIN A1C: Hemoglobin A1C: 7.8 % — ABNORMAL HIGH (ref 4.8–5.9)

## 2017-07-13 NOTE — Telephone Encounter (Signed)
Spoke with patient.  Undecided at this time

## 2017-07-22 ENCOUNTER — Encounter

## 2017-07-22 MED ORDER — METFORMIN HCL ER 500 MG PO TB24
500 | ORAL_TABLET | Freq: Four times a day (QID) | ORAL | 1 refills | Status: DC
Start: 2017-07-22 — End: 2017-08-03

## 2017-07-22 NOTE — Telephone Encounter (Signed)
Metformin 500 mg 1 qid #40 NR phoned in to [facility]. Pt is out of medication.

## 2017-07-28 NOTE — Telephone Encounter (Signed)
Spoke with patient.  Pt. is following doctor to [facility].

## 2017-08-03 ENCOUNTER — Ambulatory Visit: Admit: 2017-08-03 | Discharge: 2017-08-03 | Payer: MEDICARE | Attending: Family | Primary: Family Medicine

## 2017-08-03 DIAGNOSIS — E119 Type 2 diabetes mellitus without complications: Secondary | ICD-10-CM

## 2017-08-03 LAB — POCT GLUCOSE: Glucose: 363 mg/dL

## 2017-08-03 LAB — POCT GLYCOSYLATED HEMOGLOBIN (HGB A1C): Hemoglobin A1C: 6.4 %

## 2017-08-03 MED ORDER — METFORMIN HCL ER 500 MG PO TB24
500 MG | ORAL_TABLET | Freq: Two times a day (BID) | ORAL | 2 refills | Status: AC
Start: 2017-08-03 — End: ?

## 2017-08-03 NOTE — Patient Instructions (Signed)
Patient Education        Learning About Meal Planning for Diabetes  Why plan your meals?  Meal planning can be a key part of managing diabetes. Planning meals and snacks with the right balance of carbohydrate, protein, and fat can help you keep your blood sugar at the target level you set with your doctor.  You don't have to eat special foods. You can eat what your family eats, including sweets once in a while. But you do have to pay attention to how often you eat and how much you eat of certain foods.  You may want to work with a dietitian or a certified diabetes educator. He or she can give you tips and meal ideas and can answer your questions about meal planning. This health professional can also help you reach a healthy weight if that is one of your goals.  What plan is right for you?  Your dietitian or diabetes educator may suggest that you start with the plate format or carbohydrate counting.  The plate format  The plate format is a simple way to help you manage how you eat. You plan meals by learning how much space each food should take on a plate. Using the plate format helps you spread carbohydrate throughout the day. It can make it easier to keep your blood sugar level within your target range. It also helps you see if you're eating healthy portion sizes.  To use the plate format, you put non-starchy vegetables on half your plate. Add meat or meat substitutes on one-quarter of the plate. Put a grain or starchy vegetable (such as brown rice or a potato) on the final quarter of the plate. You can add a small piece of fruit and some low-fat or fat-free milk or yogurt, depending on your carbohydrate goal for each meal.  Here are some tips for using the plate format:  ?? Make sure that you are not using an oversized plate. A 9-inch plate is best. Many restaurants use larger plates.  ?? Get used to using the plate format at home. Then you can use it when you eat out.  ?? Write down your questions about using the  plate format. Talk to your doctor, a dietitian, or a diabetes educator about your concerns.  Carbohydrate counting  With carbohydrate counting, you plan meals based on the amount of carbohydrate in each food. Carbohydrate raises blood sugar higher and more quickly than any other nutrient. It is found in desserts, breads and cereals, and fruit. It's also found in starchy vegetables such as potatoes and corn, grains such as rice and pasta, and milk and yogurt. Spreading carbohydrate throughout the day helps keep your blood sugar levels within your target range.  Your daily amount depends on several things, including your weight, how active you are, which diabetes medicines you take, and what your goals are for your blood sugar levels. A registered dietitian or diabetes educator can help you plan how much carbohydrate to include in each meal and snack.  A guideline for your daily amount of carbohydrate is:  ?? 45 to 60 grams at each meal. That's about the same as 3 to 4 carbohydrate servings.  ?? 15 to 20 grams at each snack. That's about the same as 1 carbohydrate serving.  The Nutrition Facts label on packaged foods tells you how much carbohydrate is in a serving of the food. First, look at the serving size on the food label. Is that the amount you   eat in a serving? All of the nutrition information on a food label is based on that serving size. So if you eat more or less than that, you'll need to adjust the other numbers. Total carbohydrate is the next thing you need to look for on the label. If you count carbohydrate servings, one serving of carbohydrate is 15 grams.  For foods that don't come with labels, such as fresh fruits and vegetables, you'll need a guide that lists carbohydrate in these foods. Ask your doctor, dietitian, or diabetes educator about books or other nutrition guides you can use.  If you take insulin, you need to know how many grams of carbohydrate are in a meal. This lets you know how much  rapid-acting insulin to take before you eat. If you use an insulin pump, you get a constant rate of insulin during the day. So the pump must be programmed at meals to give you extra insulin to cover the rise in blood sugar after meals.  When you know how much carbohydrate you will eat, you can take the right amount of insulin. Or, if you always use the same amount of insulin, you need to make sure that you eat the same amount of carbohydrate at meals.  If you need more help to understand carbohydrate counting and food labels, ask your doctor, dietitian, or diabetes educator.  How do you get started with meal planning?  Here are some tips to get started:  ?? Plan your meals a week at a time. Don't forget to include snacks too.  ?? Use cookbooks or online recipes to plan several main meals. Plan some quick meals for busy nights. You also can double some recipes that freeze well. Then you can save half for other busy nights when you don't have time to cook.  ?? Make sure you have the ingredients you need for your recipes. If you're running low on basic items, put these items on your shopping list too.  ?? List foods that you use to make breakfasts, lunches, and snacks. List plenty of fruits and vegetables.  ?? Post this list on the refrigerator. Add to it as you think of more things you need.  ?? Take the list to the store to do your weekly shopping.  Follow-up care is a key part of your treatment and safety. Be sure to make and go to all appointments, and call your doctor if you are having problems. It's also a good idea to know your test results and keep a list of the medicines you take.  Where can you learn more?  Go to [email or web address] and sign in to your MyChart account. Enter X936 in the Search Health Information box to learn more about "Learning About Meal Planning for Diabetes."     If you do not have an account, please click on the "Sign Up Now" link.  Current as of: December 15, 2016  Content  Version: 11.9  ?? 2006-2018 [employer]. Care instructions adapted under license by [facility]. If you have questions about a medical condition or this instruction, always ask your healthcare professional. [employer] disclaims any warranty or liability for your use of this information.

## 2017-08-03 NOTE — Progress Notes (Signed)
Subjective:      Patient ID: Lori Chase is a 77 y.o. female who presents today for:     Chief Complaint   Patient presents with   ??? Diabetes     Pt. is here today for a follow up on DM. She is currently taking Glipizide and Metformin with moderate relief, no side effects to report at this time. She checks sugar at home. It was 146 today. las a1c was done 05/31/17 at 7.8.        Diabetes   She presents for her follow-up diabetic visit. She has type 2 diabetes mellitus. There are no hypoglycemic associated symptoms. Pertinent negatives for hypoglycemia include no dizziness. There are no diabetic associated symptoms. Pertinent negatives for diabetes include no chest pain, no fatigue, no polydipsia, no polyphagia and no polyuria. There are no hypoglycemic complications. Symptoms are stable. There are no known risk factors for coronary artery disease. Current diabetic treatment includes oral agent (dual therapy).     Pt reports BG was 146 this morning. She reports that it fluctuates between 104-150 typically. She reports she ate some cereal prior to coming in.     Past Medical History:   Diagnosis Date   ??? Anxiety    ??? Cancer (HCC)     breast   ??? Colon polyp    ??? Hypertension    ??? Osteopenia     last DXA 10/2014   ??? Right knee pain 01/07/2012   ??? 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 Type II or unspecified type diabetes mellitus without mention of complication, not stated as uncontrolled      Past Surgical History:   Procedure Laterality Date   ??? BREAST LUMPECTOMY  2007   ??? CHOLECYSTECTOMY     ??? COLONOSCOPY  09/18/12    [name]   ??? TONSILLECTOMY  age 38     Family History   Problem Relation Age of Onset   ??? High Blood Pressure Mother    ??? High Blood Pressure Father      Social History     Socioeconomic History   ??? Marital status: Married     Spouse name: Not on file   ??? Number of children: Not on file   ??? Years of education: Not on file   ??? Highest education level: Not on file   Occupational History   ??? Not on  file   Social Needs   ??? Financial resource strain: Not on file   ??? Food insecurity:     Worry: Not on file     Inability: Not on file   ??? Transportation needs:     Medical: Not on file     Non-medical: Not on file   Tobacco Use   ??? Smoking status: Never Smoker   ??? Smokeless tobacco: Never Used   Substance and Sexual Activity   ??? Alcohol use: Yes     Comment: occ wine   ??? Drug use: No   ??? Sexual activity: Not on file   Lifestyle   ??? Physical activity:     Days per week: Not on file     Minutes per session: Not on file   ??? Stress: Not on file   Relationships   ??? Social connections:     Talks on phone: Not on file     Gets together: Not on file     Attends religious service: Not on file     Active member  of club or organization: Not on file     Attends meetings of clubs or organizations: Not on file     Relationship status: Not on file   ??? Intimate partner violence:     Fear of current or ex partner: Not on file     Emotionally abused: Not on file     Physically abused: Not on file     Forced sexual activity: Not on file   Other Topics Concern   ??? Not on file   Social History Narrative   ??? Not on file     Current Outpatient Medications on File Prior to Visit   Medication Sig Dispense Refill   ??? atorvastatin (LIPITOR) 10 MG tablet TAKE 1 TABLET BY MOUTH EVERY DAY. 90 tablet 1   ??? amLODIPine (NORVASC) 10 MG tablet Take 1 tablet by mouth daily 90 tablet 1   ??? pantoprazole (PROTONIX) 40 MG tablet Take 1 tablet by mouth daily 90 tablet 1   ??? alendronate (FOSAMAX) 70 MG tablet TAKE 1 TABLET BY MOUTH ONCE A WEEK 12 tablet 1   ??? glimepiride (AMARYL) 4 MG tablet TAKE 2 TABLETS DAILY 180 tablet 1   ??? blood glucose test strips (ACCU-CHEK SMARTVIEW) strip Check daily DX: E11.9 100 each 3   ??? ACCU-CHEK FASTCLIX LANCETS MISC Check daily DX: E11.9 100 each 3   ??? Cholecalciferol (VITAMIN D) 2000 units TABS tablet Take 1 tablet by mouth daily 90 tablet 1   ??? magnesium (MAGNESIUM-OXIDE) 250 MG TABS tablet Take 250 mg by mouth daily      ??? Blood Glucose Monitoring Suppl (ACCU-CHEK NANO SMARTVIEW) w/Device KIT Use to check daily DX: E11.9 1 kit 0     No current facility-administered medications on file prior to visit.        Allergies:  Morphine; Oxycontin [oxycodone]; Penicillins; and Invokana [canagliflozin]    Review of Systems   Constitutional: Negative for chills, fatigue and fever.   Respiratory: Negative for shortness of breath.    Cardiovascular: Negative for chest pain, palpitations and leg swelling.   Endocrine: Negative for heat intolerance, polydipsia, polyphagia and polyuria.   Genitourinary: Negative for frequency and urgency.   Neurological: Negative for dizziness.       Objective:   BP 124/60    Pulse 80    Temp 98.1 ??F (36.7 ??C) (Oral)    Resp 16    Ht 5' 6"  (1.676 m)    Wt 147 lb (66.7 kg)    BMI 23.73 kg/m??     Physical Exam   Constitutional: She is oriented to person, place, and time. She appears well-developed and well-nourished.   HENT:   Head: Normocephalic.   Right Ear: Tympanic membrane, external ear and ear canal normal.   Left Ear: Tympanic membrane, external ear and ear canal normal.   Nose: Nose normal.   Mouth/Throat: Uvula is midline, oropharynx is clear and moist and mucous membranes are normal.   Eyes: Conjunctivae are normal. Right eye exhibits no discharge. Left eye exhibits no discharge.   Neck: Normal range of motion.   Cardiovascular: Normal rate, regular rhythm and normal heart sounds.   Pulmonary/Chest: Effort normal and breath sounds normal. No respiratory distress.   Lymphadenopathy:     She has no cervical adenopathy.   Neurological: She is alert and oriented to person, place, and time.   Skin: Skin is warm and dry.   Psychiatric: She has a normal mood and affect. Her behavior is normal.  Assessment:          Diagnosis Orders   1. Type 2 diabetes mellitus without complication, without long-term current use of insulin (HCC)  POCT glycosylated hemoglobin (Hb A1C)    Microalbumin / Creatinine Urine Ratio     POCT Glucose   2. DM type 2 with diabetic dyslipidemia (HCC)  metFORMIN (GLUCOPHAGE-XR) 500 MG extended release tablet       Plan:      Orders Placed This Encounter   Procedures   ??? Microalbumin / Creatinine Urine Ratio     Standing Status:   Future     Standing Expiration Date:   08/04/2018   ??? POCT glycosylated hemoglobin (Hb A1C)   ??? POCT Glucose     Results for POC orders placed in visit on 08/03/17   POCT glycosylated hemoglobin (Hb A1C)   Result Value Ref Range    Hemoglobin A1C 6.4 %   POCT Glucose   Result Value Ref Range    Glucose 363 mg/dL            Orders Placed This Encounter   Medications   ??? metFORMIN (GLUCOPHAGE-XR) 500 MG extended release tablet     Sig: Take 2 tablets by mouth 2 times daily     Dispense:  120 tablet     Refill:  2       Return in about 3 months (around 11/03/2017).     hgb A1c improved from previous. Will continue current treatment plan and follow up in approximately 3 months. Pt was agreeable.     Reviewed with the patient: current clinicalstatus, medications, activities and diet.     Side effects, adverse effects of the medication prescribedtoday, as well as treatment plan/ rationale and result expectations have been discussedwith the patient who expresses understanding and desires to proceed.    Close follow upto evaluate treatment results and for coordination of care.  I have reviewedthe patient's medical history in detail and updated the computerized patient record.    Rubbie Battiest, APRN - CNP

## 2017-08-29 ENCOUNTER — Encounter: Attending: Family Medicine | Primary: Family Medicine

## 2020-02-29 MED ORDER — PANTOPRAZOLE SODIUM 40 MG PO TBEC
40 MG | ORAL | Status: DC
Start: 2020-02-29 — End: 2020-02-29

## 2020-02-29 MED ORDER — ACETAMINOPHEN 500 MG PO TABS
500 MG | Freq: Four times a day (QID) | ORAL | Status: DC
Start: 2020-02-29 — End: 2020-02-29

## 2020-02-29 MED ORDER — GENERIC EXTERNAL MEDICATION
Status: DC
Start: 2020-02-29 — End: 2020-02-29

## 2020-02-29 MED ORDER — LABETALOL HCL 100 MG PO TABS
100 MG | ORAL | Status: DC | PRN
Start: 2020-02-29 — End: 2020-02-29

## 2020-02-29 MED ORDER — HEPARIN SODIUM (PORCINE) 5000 UNIT/ML IJ SOLN
5000 UNIT/ML | Freq: Two times a day (BID) | INTRAMUSCULAR | Status: DC
Start: 2020-02-29 — End: 2020-02-29

## 2020-02-29 MED ORDER — INSULIN LISPRO 100 UNIT/ML SC SOLN
100 UNIT/ML | SUBCUTANEOUS | Status: DC
Start: 2020-02-29 — End: 2020-02-29

## 2020-02-29 MED ORDER — KETOROLAC TROMETHAMINE 30 MG/ML IJ SOLN
30 MG/ML | Freq: Four times a day (QID) | INTRAMUSCULAR | Status: DC
Start: 2020-02-29 — End: 2020-02-29

## 2020-02-29 MED ORDER — AMLODIPINE BESYLATE 10 MG PO TABS
10 MG | Freq: Every day | ORAL | Status: DC
Start: 2020-02-29 — End: 2020-02-29

## 2020-02-29 MED ORDER — DSS 100 MG PO CAPS
100 MG | Freq: Two times a day (BID) | ORAL | Status: DC
Start: 2020-02-29 — End: 2020-02-29

## 2020-02-29 MED ORDER — ATORVASTATIN CALCIUM 10 MG PO TABS
10 MG | Freq: Every evening | ORAL | Status: DC
Start: 2020-02-29 — End: 2020-02-29

## 2020-02-29 MED ORDER — GENERIC EXTERNAL MEDICATION
Freq: Two times a day (BID) | Status: DC
Start: 2020-02-29 — End: 2020-02-29

## 2020-02-29 MED ORDER — TRAMADOL HCL 50 MG PO TABS
50 MG | ORAL | Status: DC | PRN
Start: 2020-02-29 — End: 2020-02-29

## 2020-02-29 MED ORDER — SODIUM CHLORIDE 0.9 % IV SOLN
0.9 % | INTRAVENOUS | Status: DC | PRN
Start: 2020-02-29 — End: 2020-02-29

## 2020-03-12 MED ORDER — PANTOPRAZOLE SODIUM 40 MG PO TBEC
40 MG | ORAL | Status: DC
Start: 2020-03-12 — End: 2020-03-12

## 2020-03-12 MED ORDER — INSULIN LISPRO 100 UNIT/ML SC SOLN
100 UNIT/ML | Freq: Every evening | SUBCUTANEOUS | Status: DC
Start: 2020-03-12 — End: 2020-03-12

## 2020-03-12 MED ORDER — GABAPENTIN 300 MG PO CAPS
300 MG | Freq: Two times a day (BID) | ORAL | Status: DC
Start: 2020-03-12 — End: 2020-03-12

## 2020-03-12 MED ORDER — ONDANSETRON HCL 4 MG/2ML IJ SOLN
4 MG/2ML | Freq: Four times a day (QID) | INTRAMUSCULAR | Status: DC | PRN
Start: 2020-03-12 — End: 2020-03-12

## 2020-03-12 MED ORDER — DAPAGLIFLOZIN PROPANEDIOL 5 MG PO TABS
5 MG | Freq: Every day | ORAL | Status: DC
Start: 2020-03-12 — End: 2020-03-12

## 2020-03-12 MED ORDER — METFORMIN HCL ER 500 MG PO TB24
500 MG | Freq: Two times a day (BID) | ORAL | Status: DC
Start: 2020-03-12 — End: 2020-03-12

## 2020-03-12 MED ORDER — CYCLOBENZAPRINE HCL 5 MG PO TABS
5 MG | Freq: Three times a day (TID) | ORAL | Status: DC
Start: 2020-03-12 — End: 2020-03-12

## 2020-03-12 MED ORDER — GLIMEPIRIDE 4 MG PO TABS
4 MG | Freq: Every day | ORAL | Status: DC
Start: 2020-03-12 — End: 2020-03-12

## 2020-03-12 MED ORDER — HEPARIN SODIUM (PORCINE) 5000 UNIT/ML IJ SOLN
5000 UNIT/ML | Freq: Two times a day (BID) | INTRAMUSCULAR | Status: DC
Start: 2020-03-12 — End: 2020-03-12

## 2020-03-12 MED ORDER — MAGNESIUM OXIDE 400 MG PO TABS
400 MG | Freq: Every day | ORAL | Status: DC
Start: 2020-03-12 — End: 2020-03-12

## 2020-03-12 MED ORDER — GENERIC EXTERNAL MEDICATION
Status: DC
Start: 2020-03-12 — End: 2020-03-12

## 2020-03-12 MED ORDER — GENERIC EXTERNAL MEDICATION
Freq: Two times a day (BID) | Status: DC
Start: 2020-03-12 — End: 2020-03-12

## 2020-03-12 MED ORDER — ACETAMINOPHEN 500 MG PO TABS
500 MG | Freq: Three times a day (TID) | ORAL | Status: DC | PRN
Start: 2020-03-12 — End: 2020-03-12

## 2020-03-12 MED ORDER — INSULIN LISPRO 100 UNIT/ML SC SOLN
100 UNIT/ML | SUBCUTANEOUS | Status: DC
Start: 2020-03-12 — End: 2020-03-12

## 2020-03-12 MED ORDER — DSS 100 MG PO CAPS
100 MG | Freq: Two times a day (BID) | ORAL | Status: DC
Start: 2020-03-12 — End: 2020-03-12

## 2020-03-12 MED ORDER — AMLODIPINE BESYLATE 10 MG PO TABS
10 MG | Freq: Every day | ORAL | Status: DC
Start: 2020-03-12 — End: 2020-03-12

## 2020-03-12 MED ORDER — SODIUM CHLORIDE 0.9 % IV SOLN
0.9 % | INTRAVENOUS | Status: DC | PRN
Start: 2020-03-12 — End: 2020-03-12

## 2020-03-12 MED ORDER — ATORVASTATIN CALCIUM 10 MG PO TABS
10 MG | Freq: Every evening | ORAL | Status: DC
Start: 2020-03-12 — End: 2020-03-12

## 2021-07-28 IMAGING — MR MRI BRAIN W/WO CONTRAST
13 of 19 series · 24 of 48 positions shown · IV contrast (gadolinium)
Comparison: None

________________________________________________________________________________________________ 
******** ADDENDUM #1 ********/n 
***CORRECTED REPORT, TECHNIQUE EDITED***
TECHNIQUE: Dedicated MR of the brain and skull base was performed without and 
with intravenous gadolinium. 5 mL of gadolinium this were measured. 2 mm 
discarded.  
MRI BRAIN AND LIMITED CRANIAL NERVE W AND WO, 07/28/2021 [DATE]: 
CLINICAL INDICATION: 80-year-old female with trigeminal neuralgia. 
Pain. History of stroke May 2020 with dysphagia. History of left breast 
cancer diagnosed in 7333 status post left lumpectomy and axillary lymph node 
dissection.
with intravenous gadolinium. 5 mL of gadolinium this were measured. 0 mm 
discarded.

[Series 201: survey · axial · 10.0mm · 0.93mm/px · 1 of 5 slices shown]
[im 1/5]
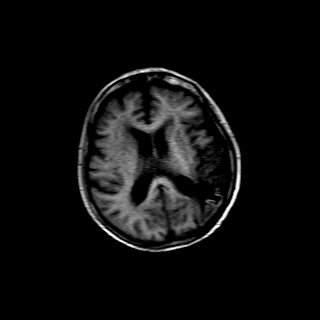

[Series 301: t2w_flair_mvxd · axial · 5.0mm · 0.86mm/px · 1 of 27 slices shown]
[im 1/27]
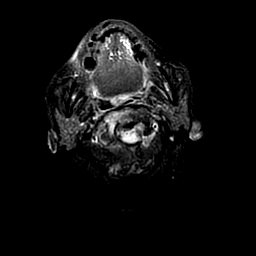

[Series 401: t2w_tse_mvxd · axial · 5.0mm · 0.69mm/px · 1 of 27 slices shown]
[im 1/27]
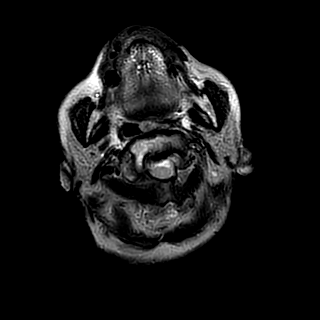

[Series 501: t1w_ffe · axial · 5.0mm · 0.86mm/px · 1 of 27 slices shown]
[im 1/27]
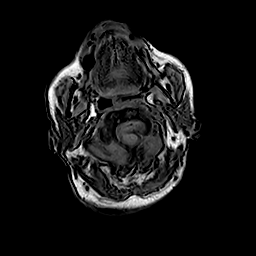

[Series 603: dadc map · axial · 5.0mm · 0.98mm/px · 1 of 25 slices shown]
[im 1/25]
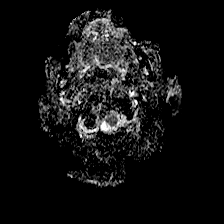

[Series 605: (id) · axial · 5.0mm · 0.98mm/px · 1 of 25 slices shown]
[im 1/25]
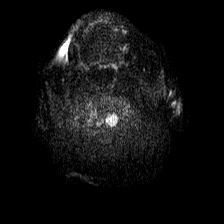

[Series 701: SWI · axial · 5.0mm · 0.40mm/px · z∈[-89,+57]mm · 4 of 120 slices shown]
[im 1/120]
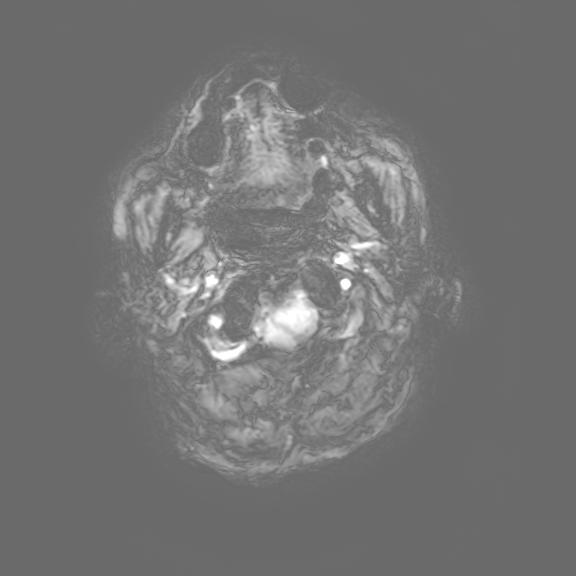
[im 40/120]
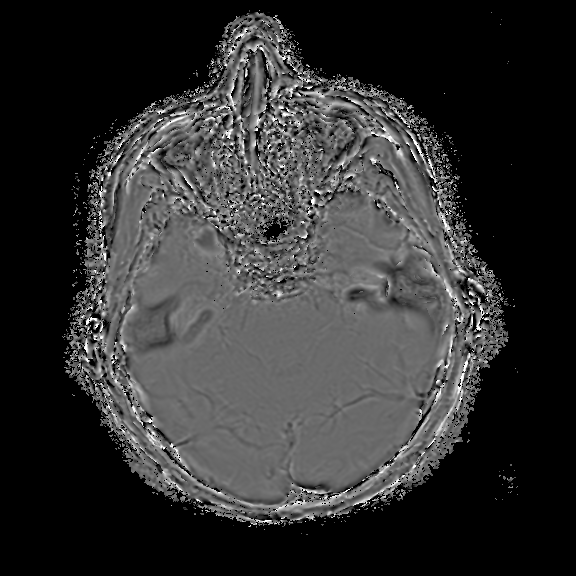
[im 80/120]
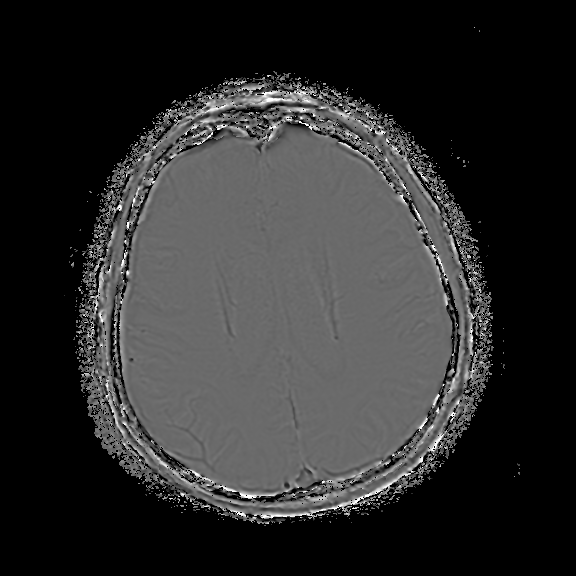
[im 120/120]
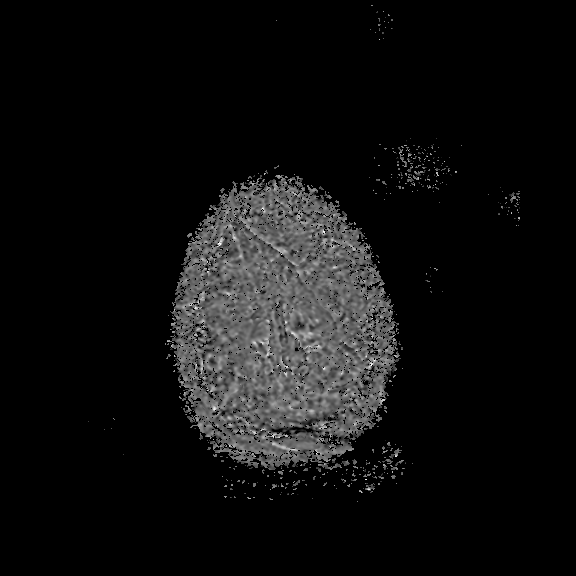

[Series 702: swip · axial · 10.0mm · 0.36mm/px · z∈[-79,+59]mm · 4 of 140 slices shown]
[im 1/140]
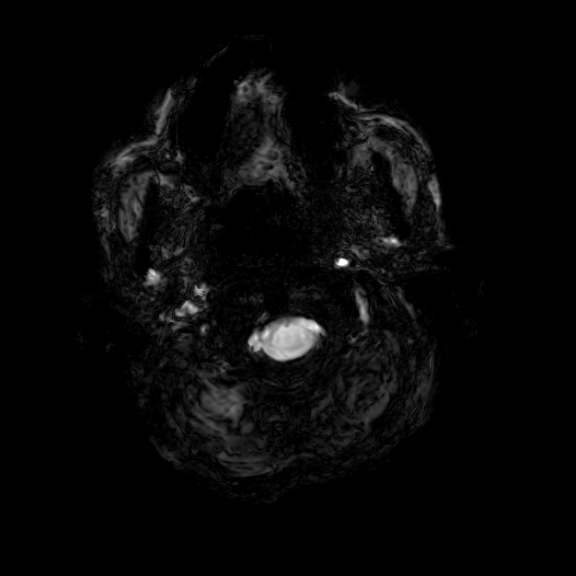
[im 47/140]
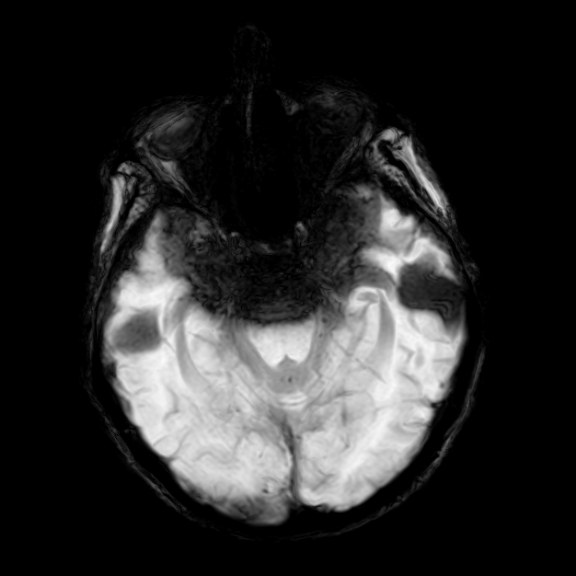
[im 93/140]
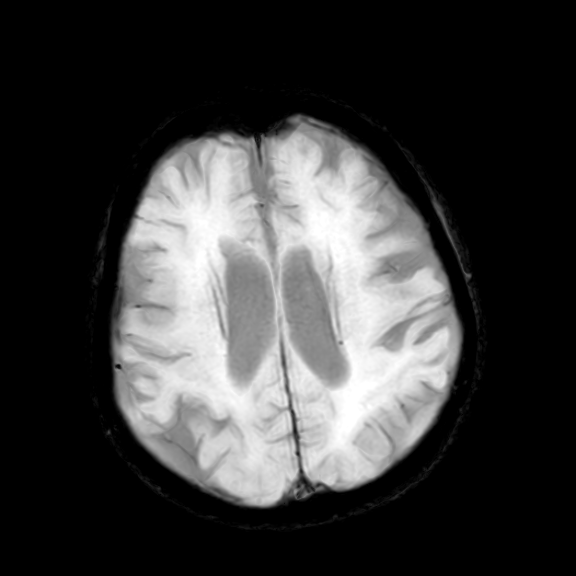
[im 140/140]
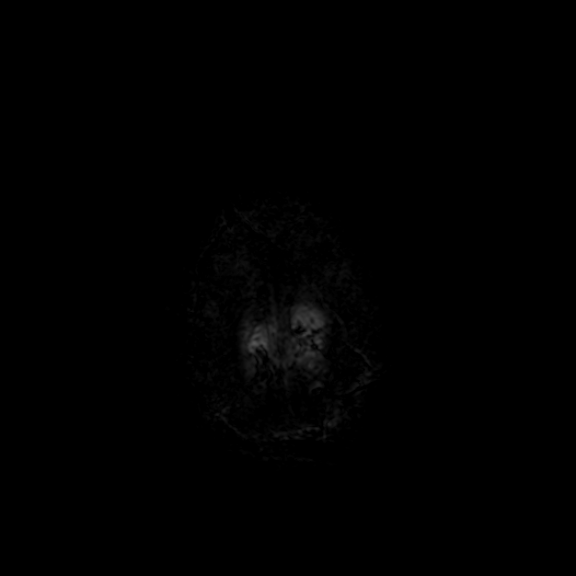

[Series 801: t1w_ffe sag · sagittal · 5.0mm · 0.66mm/px · 1 of 27 slices shown]
[im 1/27]
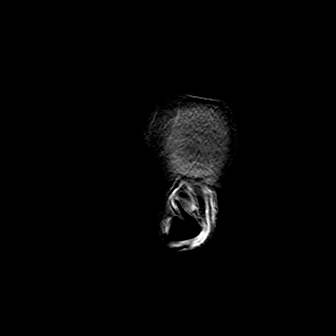

[Series 1501: +gad t1w_ffe · axial · 5.0mm · 0.86mm/px · 1 of 27 slices shown]
[im 1/27]
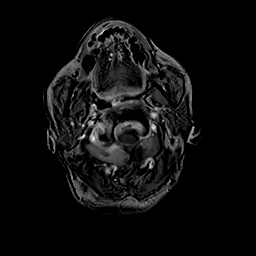

[Series 1601: T1 post-contrast · axial · 5.0mm · 0.41mm/px · 1 of 27 slices shown (1 of 2)]
[im 1/27]
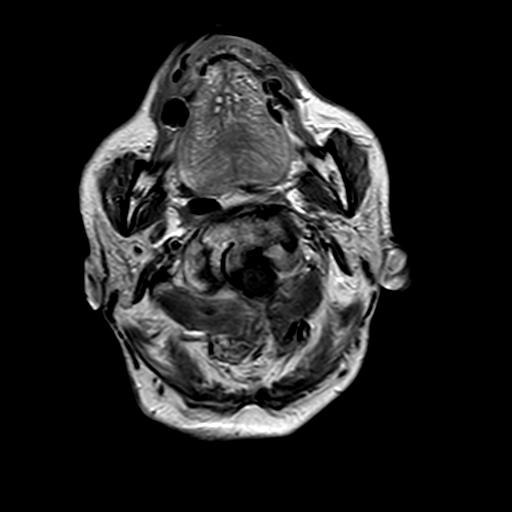

[Series 1701: T1 post-contrast · coronal · 4.0mm · 0.41mm/px · 1 of 34 slices shown (2 of 2)]
[im 1/34]
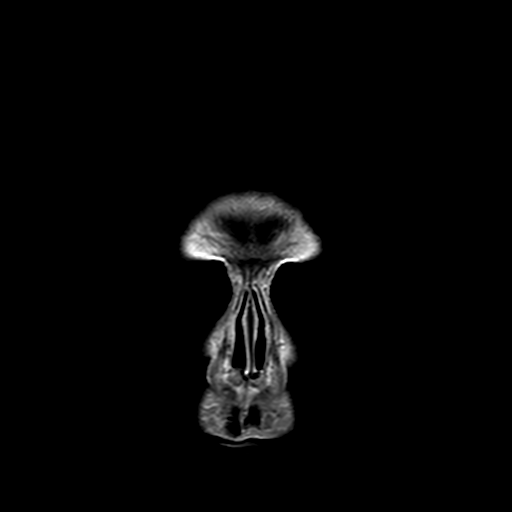

[Series 1802: DIXON fat-sat · axial · 0.9mm · 0.42mm/px · z∈[-77,-14]mm · 6 of 153 slices shown]
[im 1/153]
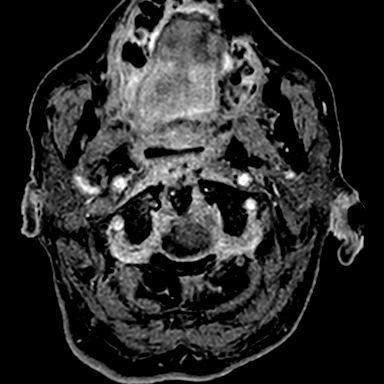
[im 31/153]
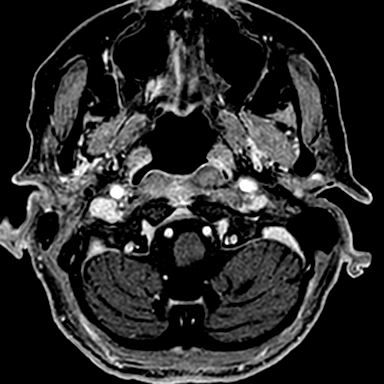
[im 61/153]
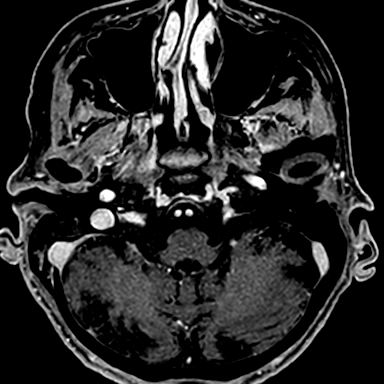
[im 92/153]
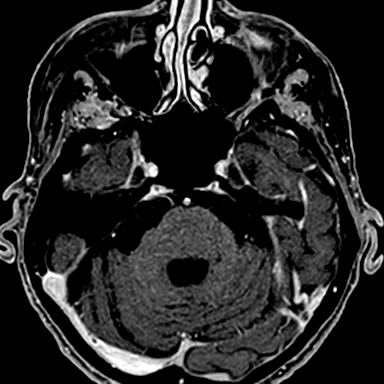
[im 122/153]
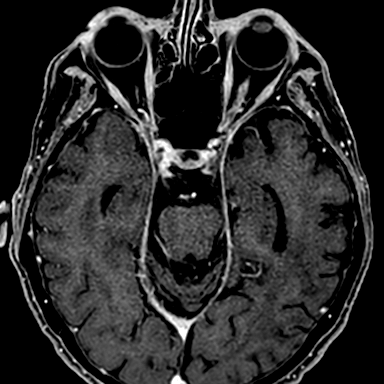
[im 153/153]
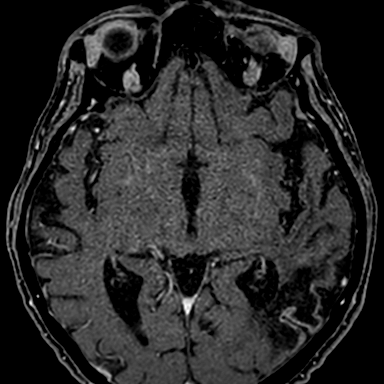

[24 of 48 positions shown; findings below may reference images not displayed]

FINDINGS: CRANIAL NERVES:  
Dedicated imaging of the cranial nerves was performed. No cerebellopontine angle 
or prepontine mass. No pathologic contrast enhancement along the course of 
either trigeminal nerve. No vascular impingement in the region of the trigeminal 
root entry zones. No evidence of cerebellar pontine angle mass lesion. No 
abnormal skull base or temporal bone enhancement. There are vascular loops noted 
in both ports acusticus regions. 
INTRACRANIAL:  
No acute or subacute infarct. There is evidence of an old appearing left 
temporal occipital infarct with hyperintense gyriform signal consistent with 
laminar necrosis. Small old left medial cerebellar hemisphere infarct. Moderate 
nonspecific confluent and patchy periventricular and deep white matter T2 FLAIR 
hyperintensity is most likely reflective of chronic small vessel vascular 
change. No mass, mass effect, midline shift, or abnormal extra-axial fluid 
collection. There is a focal region of encephalomalacia overlying the cortex of 
the right parietal lobe which may be on a congenital or developmental basis. See 
coronal image 25 of series 2822. Otherwise there is moderate generalized brain 
volume loss without hydrocephalus. Appropriate skull base vascular flow voids. 
No abnormal intracranial enhancement. 
OTHER: 
ORBITS/SINUSES/T-BONES: Right-sided aphakia. Clear sinuses. Right mastoid 
effusion. Visualized nasopharynx and skull base unremarkable. 
 MARROW SIGNAL/SOFT TISSUES: No focal suspect signal abnormality. There is 
evidence of chronic appearing type II dens fracture. See dedicated cervical 
spine MRI for further evaluation. There is narrowing of the foramen magnum and 
at the C1 level which is at least moderate.
IMPRESSION: 1. No abnormality identified along the course of either trigeminal nerve. No 
abnormal enhancement involving the brainstem, skull base, or IAC regions. 
2. No acute intracranial process. Old appearing left temporo-occipital infarct 
with laminar necrosis with small old left medial cerebellar hemispheric infarct. 
3. Other chronic appearing brain parenchymal changes as detailed above. 
4. Right mastoid effusion. 
5. Chronic type II dens fracture. See dedicated cervical spine MRI for further 
evaluation.

## 2021-07-28 IMAGING — MR MRI CERVICAL SPINE WITHOUT CONTRAST
5 of 6 series · 23 of 48 positions shown · IV contrast (gadolinium)
Comparison: No prior cervical examination available

________________________________________________________________________________________________ 
MRI CERVICAL SPINE WITHOUT CONTRAST, 07/28/2021 [DATE]: 
CLINICAL INDICATION: History of cervical fracture May 2020, renal injury 
after fall March 2021
TECHNIQUE: Sagittal T1, Sagittal T2, Sagittal STIR, Axial TSE and Axial [id number] 
images of the cervical spine were performed without intravenous gadolinium 
enhancement.

[Series 901: survey* · coronal · 10.0mm · 1.56mm/px · 5 of 10 slices shown]
[im 1/10]
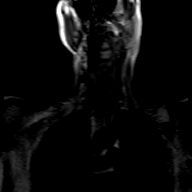
[im 3/10]
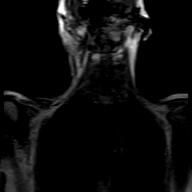
[im 5/10]
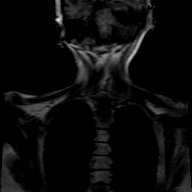
[im 7/10]
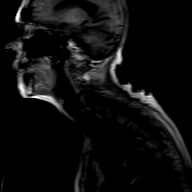
[im 10/10]
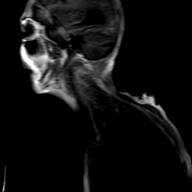

[Series 1001: t2w_cor-surv · oblique · 5.0mm · 0.85mm/px · 3 of 7 slices shown]
[im 1/7]
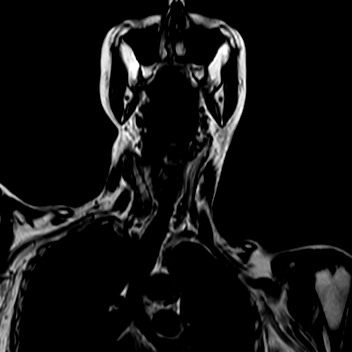
[im 4/7]
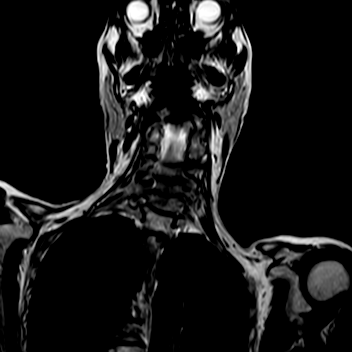
[im 7/7]
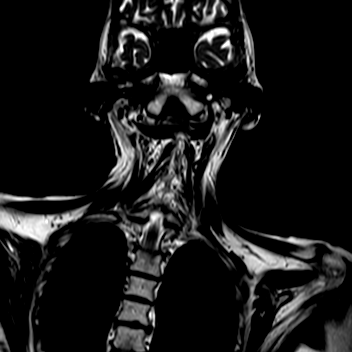

[Series 1101: t2w_mv_xd_sag · sagittal · 3.0mm · 0.30mm/px · 7 of 15 slices shown]
[im 1/15]
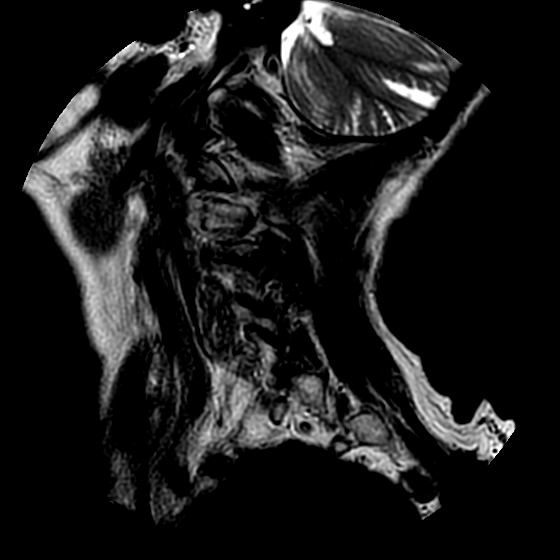
[im 3/15]
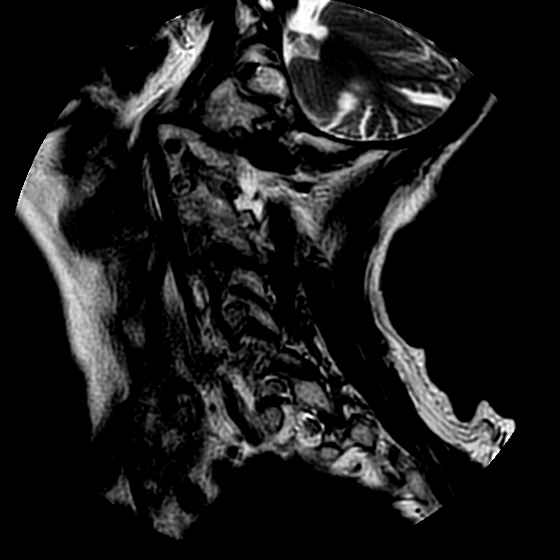
[im 5/15]
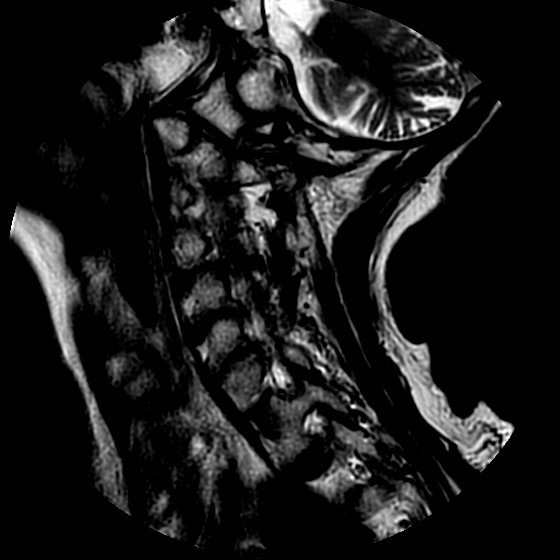
[im 8/15]
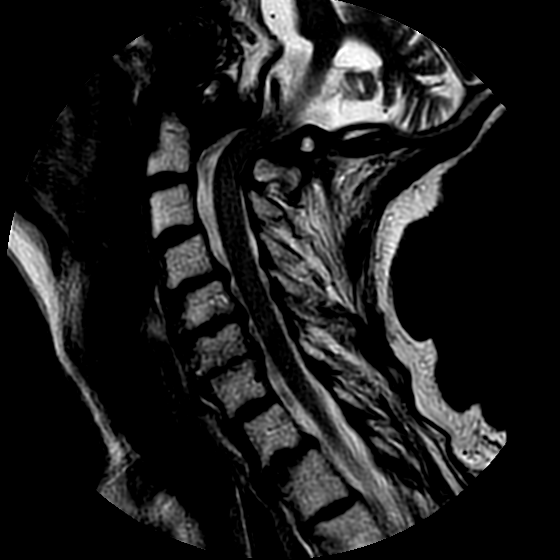
[im 10/15]
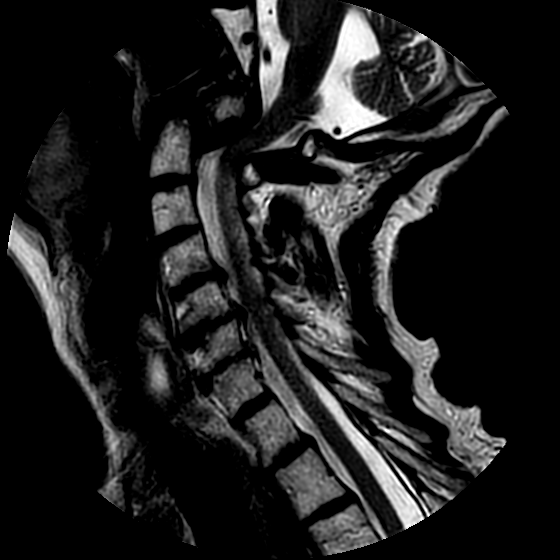
[im 12/15]
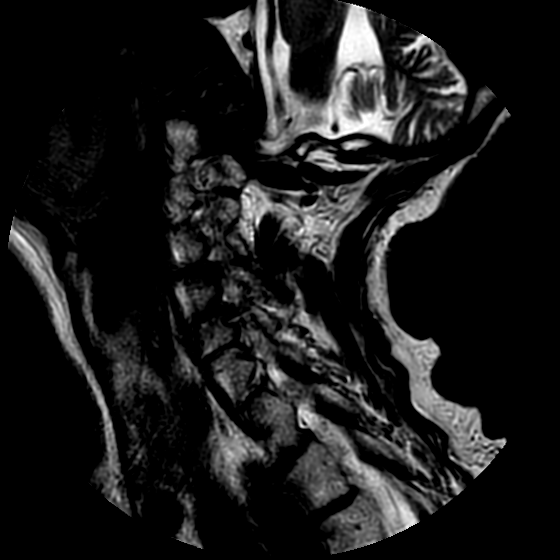
[im 15/15]
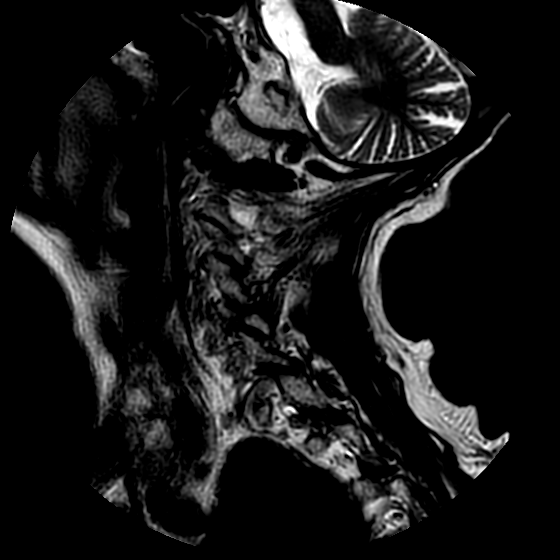

[Series 1201: t1_sag · sagittal · 3.0mm · 0.32mm/px · 7 of 15 slices shown]
[im 1/15]
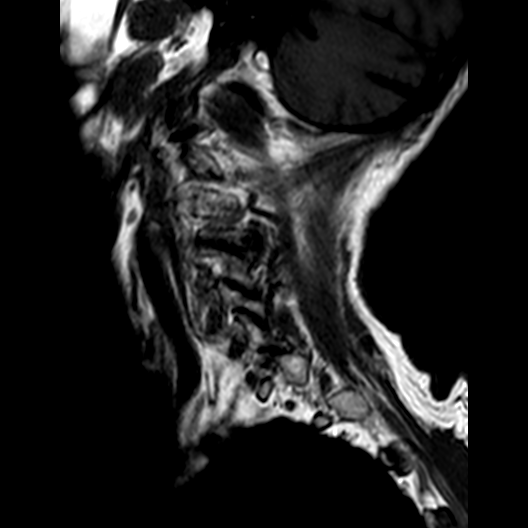
[im 3/15]
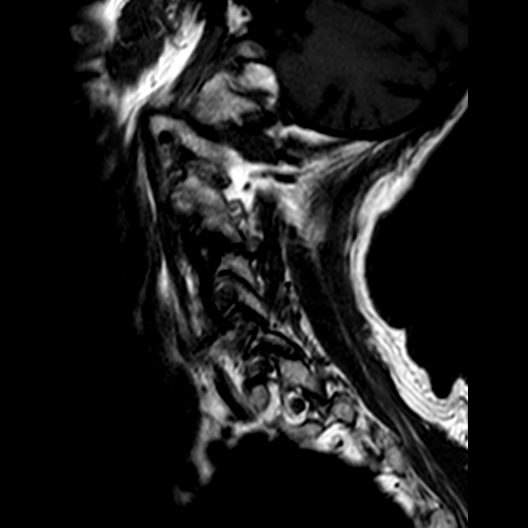
[im 5/15]
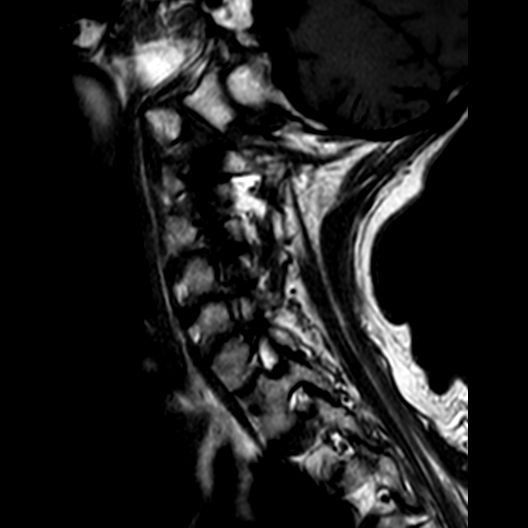
[im 8/15]
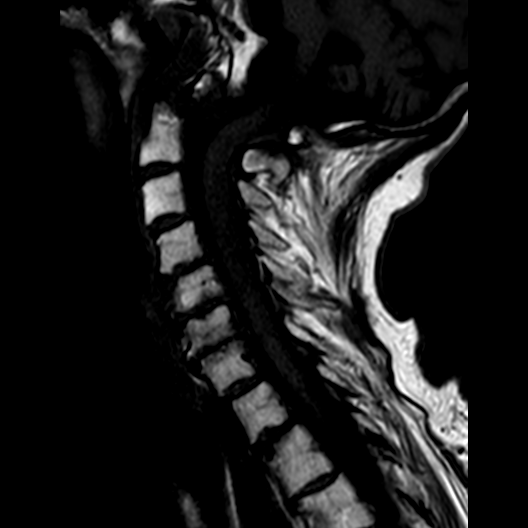
[im 10/15]
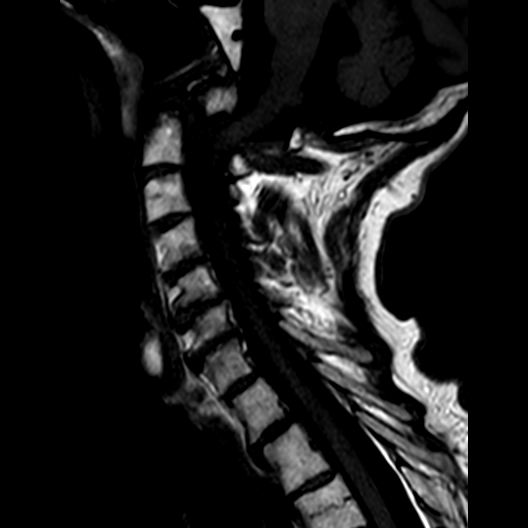
[im 12/15]
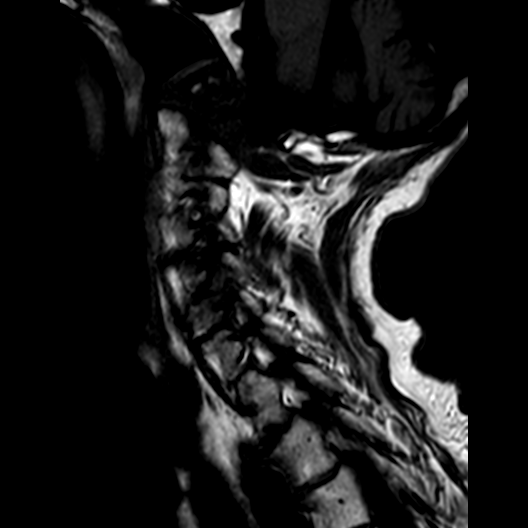
[im 15/15]
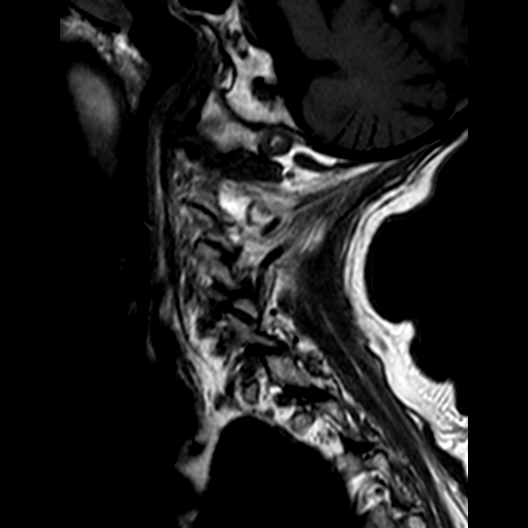

[Series 1301: (id)_tse_(id) stacks · oblique · 3.0mm · 0.25mm/px · 1 of 40 slices shown]
[im 3/40]
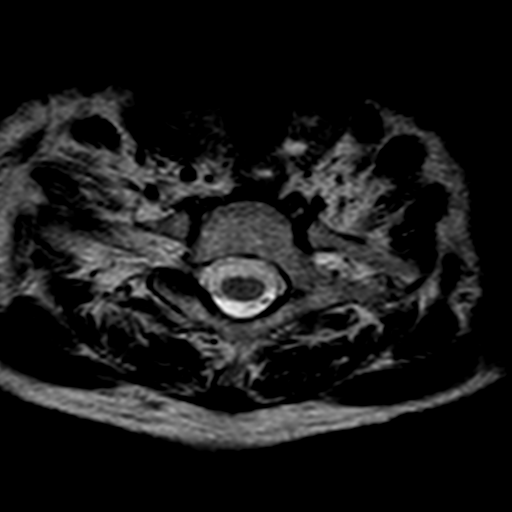

[23 of 48 positions shown; findings below may reference images not displayed]

FINDINGS: There is a fracture at the base of the dens, with 3 mm retrolisthesis 
of the dens in relation of the C2 vertebral body. There is also moderate 
thickening of the transverse ligament. These changes produce deformity of the 
ventral cord opposite C1, narrowing canal diameter at this level 2 approximately 
7 mm. There is exaggerated lordotic curvature at the junction of the dens and C2 
body. There is mild reactive edema in the upper C2 body seen on sagittal STIR 
image 8. 
There is no intramedullary signal abnormality. There is mild to moderate 
cerebellar atrophy. There is a chronic 9 mm infarct in the inferior left 
cerebellum seen on axial T2 image 35. 
Other cervical vertebral heights are intact. There is spondylosis, with marked 
disc narrowing from C4 through C7, also at T1-2. There is slight anterolisthesis 
at C7-T1. 
Disc-osteophyte approximates the cord without deformity at C4-5 where canal 
diameter is 9.5 mm, C5-6 with canal diameter 9 mm, and C6-7 Canal diameter 9 mm. 
The canal is open opposite C2-3, C3-4 and C7-T1. 
There is moderate to marked foraminal stenosis bilaterally at C4-5. There is 
moderate left, moderate to marked right C5-6 foraminal stenosis. Moderate left, 
mild right C6-7 foraminal stenosis. Other foramina are open.
IMPRESSION: Fracture at the base of the dens, with 3 mm retrolisthesis of the dens, in 
combination with thickened transverse ligament producing mild to moderate canal 
stenosis opposite C1. There is mild deformity of the cord at this level. There 
is exaggerated lordotic curvature at this level. Mild reactive edema in the 
upper C2 vertebral body. 
No intramedullary signal abnormality. 
Spondylosis, with mild canal stenosis as described at C4-5, C5-6 and C6-7. 
Multilevel foraminal stenosis appears most pronounced bilaterally at C4-5, right 
greater than left at C5-6. 
Small chronic left cerebellar infarct. 
No findings to indicate spinal malignancy.
